# Patient Record
Sex: Male | Born: 1940 | Race: White | Hispanic: No | Marital: Married | Smoking: Former smoker
Health system: Southern US, Community
[De-identification: ages and names within clinical notes are randomized; demographics above are authoritative.]

## PROBLEM LIST (undated history)

## (undated) DIAGNOSIS — R7303 Prediabetes: Secondary | ICD-10-CM

## (undated) DIAGNOSIS — I4891 Unspecified atrial fibrillation: Secondary | ICD-10-CM

## (undated) DIAGNOSIS — F411 Generalized anxiety disorder: Secondary | ICD-10-CM

## (undated) DIAGNOSIS — I10 Essential (primary) hypertension: Secondary | ICD-10-CM

## (undated) HISTORY — DX: Prediabetes: R73.03

## (undated) HISTORY — DX: Essential (primary) hypertension: I10

## (undated) HISTORY — PX: TOOTH EXTRACTION: SUR596

## (undated) HISTORY — DX: Unspecified atrial fibrillation: I48.91

## (undated) HISTORY — DX: Generalized anxiety disorder: F41.1

---

## 2003-09-03 HISTORY — PX: OTHER SURGICAL HISTORY: SHX169

## 2017-04-08 ENCOUNTER — Ambulatory Visit: Payer: Self-pay | Admitting: Family Medicine

## 2019-03-12 ENCOUNTER — Other Ambulatory Visit: Payer: Self-pay

## 2019-03-15 ENCOUNTER — Encounter (INDEPENDENT_AMBULATORY_CARE_PROVIDER_SITE_OTHER): Payer: Self-pay

## 2019-03-15 ENCOUNTER — Inpatient Hospital Stay: Payer: Medicare Other | Attending: Internal Medicine | Admitting: Internal Medicine

## 2019-03-15 ENCOUNTER — Inpatient Hospital Stay: Payer: Medicare Other | Admitting: *Deleted

## 2019-03-15 ENCOUNTER — Encounter: Payer: Self-pay | Admitting: Internal Medicine

## 2019-03-15 ENCOUNTER — Other Ambulatory Visit: Payer: Self-pay

## 2019-03-15 DIAGNOSIS — Z87891 Personal history of nicotine dependence: Secondary | ICD-10-CM | POA: Insufficient documentation

## 2019-03-15 DIAGNOSIS — N183 Chronic kidney disease, stage 3 unspecified: Secondary | ICD-10-CM | POA: Insufficient documentation

## 2019-03-15 DIAGNOSIS — I4891 Unspecified atrial fibrillation: Secondary | ICD-10-CM | POA: Diagnosis not present

## 2019-03-15 DIAGNOSIS — Z7901 Long term (current) use of anticoagulants: Secondary | ICD-10-CM

## 2019-03-15 DIAGNOSIS — N189 Chronic kidney disease, unspecified: Secondary | ICD-10-CM | POA: Diagnosis present

## 2019-03-15 DIAGNOSIS — D649 Anemia, unspecified: Secondary | ICD-10-CM

## 2019-03-15 DIAGNOSIS — Z801 Family history of malignant neoplasm of trachea, bronchus and lung: Secondary | ICD-10-CM | POA: Diagnosis not present

## 2019-03-15 DIAGNOSIS — D631 Anemia in chronic kidney disease: Secondary | ICD-10-CM | POA: Diagnosis not present

## 2019-03-15 LAB — CBC WITH DIFFERENTIAL/PLATELET
Abs Immature Granulocytes: 0.01 10*3/uL (ref 0.00–0.07)
Basophils Absolute: 0.1 10*3/uL (ref 0.0–0.1)
Basophils Relative: 2 %
Eosinophils Absolute: 0.3 10*3/uL (ref 0.0–0.5)
Eosinophils Relative: 5 %
HCT: 27 % — ABNORMAL LOW (ref 39.0–52.0)
Hemoglobin: 8.8 g/dL — ABNORMAL LOW (ref 13.0–17.0)
Immature Granulocytes: 0 %
Lymphocytes Relative: 22 %
Lymphs Abs: 1.2 10*3/uL (ref 0.7–4.0)
MCH: 29.7 pg (ref 26.0–34.0)
MCHC: 32.6 g/dL (ref 30.0–36.0)
MCV: 91.2 fL (ref 80.0–100.0)
Monocytes Absolute: 0.5 10*3/uL (ref 0.1–1.0)
Monocytes Relative: 9 %
Neutro Abs: 3.4 10*3/uL (ref 1.7–7.7)
Neutrophils Relative %: 62 %
Platelets: 125 10*3/uL — ABNORMAL LOW (ref 150–400)
RBC: 2.96 MIL/uL — ABNORMAL LOW (ref 4.22–5.81)
RDW: 15.8 % — ABNORMAL HIGH (ref 11.5–15.5)
WBC: 5.5 10*3/uL (ref 4.0–10.5)
nRBC: 0 % (ref 0.0–0.2)

## 2019-03-15 LAB — LACTATE DEHYDROGENASE: LDH: 143 U/L (ref 98–192)

## 2019-03-15 LAB — COMPREHENSIVE METABOLIC PANEL
ALT: 12 U/L (ref 0–44)
AST: 19 U/L (ref 15–41)
Albumin: 4.4 g/dL (ref 3.5–5.0)
Alkaline Phosphatase: 55 U/L (ref 38–126)
Anion gap: 6 (ref 5–15)
BUN: 26 mg/dL — ABNORMAL HIGH (ref 8–23)
CO2: 23 mmol/L (ref 22–32)
Calcium: 9 mg/dL (ref 8.9–10.3)
Chloride: 107 mmol/L (ref 98–111)
Creatinine, Ser: 1.61 mg/dL — ABNORMAL HIGH (ref 0.61–1.24)
GFR calc Af Amer: 47 mL/min — ABNORMAL LOW (ref >60)
GFR calc non Af Amer: 40 mL/min — ABNORMAL LOW (ref >60)
Glucose, Bld: 100 mg/dL — ABNORMAL HIGH (ref 70–99)
Potassium: 4.8 mmol/L (ref 3.5–5.1)
Sodium: 136 mmol/L (ref 135–145)
Total Bilirubin: 0.9 mg/dL (ref 0.3–1.2)
Total Protein: 8.8 g/dL — ABNORMAL HIGH (ref 6.5–8.1)

## 2019-03-15 LAB — FOLATE: Folate: 13.5 ng/mL (ref >5.9)

## 2019-03-15 LAB — IRON AND TIBC
Iron: 84 ug/dL (ref 45–182)
Saturation Ratios: 24 % (ref 17.9–39.5)
TIBC: 346 ug/dL (ref 250–450)
UIBC: 262 ug/dL

## 2019-03-15 LAB — FERRITIN: Ferritin: 58 ng/mL (ref 24–336)

## 2019-03-15 NOTE — Progress Notes (Signed)
Patient referred by PCP for anemia and thrombocytopenia evaluation.  Recent labs drawn on 03/03/19 resulted in [place]

## 2019-03-15 NOTE — Assessment & Plan Note (Addendum)
#  Normocytic anemia-hemoglobin 9/chronic.  I suspect secondary to CKD/iron deficiency.  However given abnormal M protein-multiple myeloma has to be ruled out/clinically less likely.  See discussion below.   #Recommend checking CBC CMP LDH; kappa lambda light chain ratio.  Iron studies/saturation folic acid.  Discussed options of bone marrow biopsy-however this could potentially wait until above work-up.  Discussed with the patient that if his anemia is from CKD would recommend IV iron infusions/erythropoietin stimulating agents [patient's mother had Procrit].  Discussed the potential acute infusion reactions with IV iron; which are quite rare.  Patient understands the risk; will proceed with infusions. 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  # A.fib- on coumadin/followed by [facility] cards.   # Discussed the patient's girlfriend over the phone at length.  Agreement.  Thank you Dr.Anderson for allowing me to participate in the care of your pleasant patient. Please do not hesitate to contact me with questions or concerns in the interim.  # DISPOSITION:  # labs today # follow up in 2 weeks- MD; no labs; Possible Ferrahem.

## 2019-03-15 NOTE — Progress Notes (Signed)
[place] NOTE  Patient Care Team: Kirk Ruths, MD as PCP - General (Internal Medicine)  CHIEF COMPLAINTS/PURPOSE OF CONSULTATION:    HEMATOLOGY HISTORY  # CHRONIC ANEMIA- [at least 0315]- worsening- July 2020- 9/ nomocytic; NO Coloscopy; EGD- [place], 2017  # ? 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  male has been referred to [place] for further evaluation/work-up for anemia.  Patient through work-up with his PCP noted to have a hemoglobin of 9; which was lower than his baseline around 11.  Also myeloma work-up showed M protein of 1 g/dL  Patient denies any blood in stools or black-colored stools.  Denies any blood in urine.  Denies any abnormal weight loss.  Patient denies any difficulty swallowing.  Patient currently not on p.o. iron because of intolerance/constipation/dyspepsia.  Patient never had a screening colonoscopy.   Review of Systems  Constitutional: Positive for malaise/fatigue. Negative for chills, diaphoresis, fever and weight loss.  HENT: Negative for nosebleeds and sore throat.   Eyes: Negative for double vision.  Respiratory: Negative for cough, hemoptysis, sputum production, shortness of breath and wheezing.   Cardiovascular: Negative for chest pain, palpitations, orthopnea and leg swelling.  Gastrointestinal: Negative for abdominal pain, blood in stool, constipation, diarrhea, heartburn, melena, nausea and vomiting.  Genitourinary: Negative for dysuria, frequency and urgency.  Musculoskeletal: Negative for back pain and joint pain.  Skin: Negative.  Negative for itching and rash.  Neurological: Negative for dizziness, tingling, focal weakness, weakness and headaches.  Endo/Heme/Allergies: Does not bruise/bleed easily.  Psychiatric/Behavioral: Negative for depression. The patient is not nervous/anxious and does not  have insomnia.     MEDICAL HISTORY:  Past Medical History:  Diagnosis Date  . Atrial fibrillation ([place])   . GAD (generalized anxiety disorder)   . Hypertension   . Prediabetes     SURGICAL HISTORY: Past Surgical History:  Procedure Laterality Date  . supraorbital artery repair  2005  . TOOTH EXTRACTION      SOCIAL HISTORY: Social History   Socioeconomic History  . Marital status: Single    Spouse name: Not on file  . Number of children: Not on file  . Years of education: Not on file  . Highest education level: Not on file  Occupational History  . Not on file  Social Needs  . Financial resource strain: Not on file  . Food insecurity    Worry: Not on file    Inability: Not on file  . Transportation needs    Medical: Not on file    Non-medical: Not on file  Tobacco Use  . Smoking status: Former Smoker    Types: Cigarettes  Substance and Sexual Activity  . Alcohol use: Never    Frequency: Never  . Drug use: Never  . Sexual activity: Not on file  Lifestyle  . Physical activity    Days per week: Not on file    Minutes per session: Not on file  . Stress: Not on file  Relationships  . Social Herbalist on phone: Not on file    Gets together: Not on file    Attends religious service: Not on file    Active member of club or organization: Not on file    Attends meetings of clubs or organizations: Not on file    Relationship status: Not on file  . Intimate  partner violence    Fear of current or ex partner: Not on file    Emotionally abused: Not on file    Physically abused: Not on file    Forced sexual activity: Not on file  Other Topics Concern  . 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     FAMILY HISTORY: Family History  Problem Relation Age of Onset  . Lung cancer Father     ALLERGIES:  is allergic to bupropion and amiodarone.  MEDICATIONS:  Current  Outpatient Medications  Medication Sig Dispense Refill  . ALPRAZolam (XANAX) 0.5 MG tablet Take 0.5 mg by mouth 2 (two) times daily.     Marland Kitchen amLODipine (NORVASC) 5 MG tablet Take 5 mg by mouth daily.     . metoprolol tartrate (LOPRESSOR) 25 MG tablet Take 25 mg by mouth daily.     . sertraline (ZOLOFT) 50 MG tablet Take by mouth.    . sotalol (BETAPACE) 80 MG tablet Take 80 mg by mouth daily.     Marland Kitchen warfarin (COUMADIN) 3 MG tablet Take as directed-Take  1 tablet by mouth  on Monday,Wednesday,Friday and 1.5 tablets by mouth all other days     No current facility-administered medications for this visit.       PHYSICAL EXAMINATION:   Vitals:   03/15/19 1121  BP: (!) 157/71  Pulse: 60  Resp: 18  Temp: 97.6 F (36.4 C)   Filed Weights   03/15/19 1121  Weight: 174 lb (78.9 kg)    Physical Exam  Constitutional: He is oriented to person, place, and time and well-developed, well-nourished, and in no distress.  HENT:  Head: Normocephalic and atraumatic.  Mouth/Throat: Oropharynx is clear and moist. No oropharyngeal exudate.  Eyes: Pupils are equal, round, and reactive to light.  Neck: Normal range of motion. Neck supple.  Cardiovascular: Normal rate and regular rhythm.  Pulmonary/Chest: No respiratory distress. He has no wheezes.  Abdominal: Soft. Bowel sounds are normal. He exhibits no distension and no mass. There is no abdominal tenderness. There is no rebound and no guarding.  Musculoskeletal: Normal range of motion.        General: No tenderness or edema.  Neurological: He is alert and oriented to person, place, and time.  Skin: Skin is warm.  Psychiatric: Affect normal.    LABORATORY DATA:  I have reviewed the data as listed Lab Results  Component Value Date   WBC 5.5 03/15/2019   HGB 8.8 (L) 03/15/2019   HCT 27.0 (L) 03/15/2019   MCV 91.2 03/15/2019   PLT 125 (L) 03/15/2019   Recent Labs    03/15/19 1206  NA 136  K 4.8  CL 107  CO2 23  GLUCOSE 100*  BUN 26*   CREATININE 1.61*  CALCIUM 9.0  GFRNONAA 40*  GFRAA 47*  PROT 8.8*  ALBUMIN 4.4  AST 19  ALT 12  ALKPHOS 55  BILITOT 0.9     No results found. Normocytic anemia # Normocytic anemia-hemoglobin 9/chronic.  I suspect secondary to CKD/iron deficiency.  However given abnormal M protein-multiple myeloma has to be ruled out/clinically less likely.  See discussion below.   #Recommend checking CBC CMP LDH; kappa lambda light chain ratio.  Iron studies/saturation folic acid.  Discussed options of bone marrow biopsy-however this could potentially wait until above work-up.  Discussed with the patient that if his anemia is from CKD would recommend IV  iron infusions/erythropoietin stimulating agents [patient's mother had Procrit].  Discussed the potential acute infusion reactions with IV iron; which are quite rare.  Patient understands the risk; will proceed with infusions. 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  # A.fib- on coumadin/followed by [facility] cards.   # Discussed the patient's girlfriend over the phone at length.  Agreement.  Thank you Dr.Anderson for allowing me to participate in the care of your pleasant patient. Please do not hesitate to contact me with questions or concerns in the interim.  # DISPOSITION:  # labs today # follow up in 2 weeks- MD; no labs; Possible Ferrahem.   All questions were answered. The patient knows to call the clinic with any problems, questions or concerns.    Cammie Sickle, MD 03/15/2019 7:08 PM

## 2019-03-16 LAB — KAPPA/LAMBDA LIGHT CHAINS
Kappa free light chain: 76.1 mg/L — ABNORMAL HIGH (ref 3.3–19.4)
Kappa, lambda light chain ratio: 1.94 — ABNORMAL HIGH (ref 0.26–1.65)
Lambda free light chains: 39.2 mg/L — ABNORMAL HIGH (ref 5.7–26.3)

## 2019-03-16 LAB — TECHNOLOGIST SMEAR REVIEW

## 2019-03-26 ENCOUNTER — Other Ambulatory Visit: Payer: Self-pay

## 2019-03-29 ENCOUNTER — Inpatient Hospital Stay (HOSPITAL_BASED_OUTPATIENT_CLINIC_OR_DEPARTMENT_OTHER): Payer: Medicare Other | Admitting: Internal Medicine

## 2019-03-29 ENCOUNTER — Inpatient Hospital Stay: Payer: Medicare Other

## 2019-03-29 ENCOUNTER — Other Ambulatory Visit: Payer: Self-pay

## 2019-03-29 VITALS — BP 128/69 | HR 55 | Resp 18

## 2019-03-29 DIAGNOSIS — D696 Thrombocytopenia, unspecified: Secondary | ICD-10-CM

## 2019-03-29 DIAGNOSIS — D649 Anemia, unspecified: Secondary | ICD-10-CM

## 2019-03-29 DIAGNOSIS — D631 Anemia in chronic kidney disease: Secondary | ICD-10-CM

## 2019-03-29 DIAGNOSIS — N183 Chronic kidney disease, stage 3 unspecified: Secondary | ICD-10-CM

## 2019-03-29 DIAGNOSIS — R5382 Chronic fatigue, unspecified: Secondary | ICD-10-CM

## 2019-03-29 DIAGNOSIS — N189 Chronic kidney disease, unspecified: Secondary | ICD-10-CM

## 2019-03-29 DIAGNOSIS — Z87891 Personal history of nicotine dependence: Secondary | ICD-10-CM

## 2019-03-29 MED ORDER — SODIUM CHLORIDE 0.9 % IV SOLN
Freq: Once | INTRAVENOUS | Status: AC
  Administered 2019-03-29: 12:00:00 via INTRAVENOUS
  Filled 2019-03-29: qty 250

## 2019-03-29 MED ORDER — ACETAMINOPHEN 325 MG PO TABS
650.0000 mg | ORAL_TABLET | Freq: Once | ORAL | Status: AC
  Administered 2019-03-29: 12:00:00 650 mg via ORAL
  Filled 2019-03-29: qty 2

## 2019-03-29 MED ORDER — SODIUM CHLORIDE 0.9 % IV SOLN
510.0000 mg | Freq: Once | INTRAVENOUS | Status: AC
  Administered 2019-03-29: 13:00:00 510 mg via INTRAVENOUS
  Filled 2019-03-29: qty 17

## 2019-03-29 NOTE — Assessment & Plan Note (Addendum)
#  Normocytic anemia-chronic; recently worsening-8.8.  Suspect second to CKD/mild iron deficiency saturation 24%; ferritin 58.  I clinically doubt this is related to his slightly elevated M protein [see below].  #Discussed with the patient the above work-up results.  Discussed with the patient that definitive diagnosis would need a bone marrow biopsy.  However since his anemia is chronic/I think it is reasonable to try iron infusion.  If his hemoglobin does not improve; I discussed with the patient that he will need a bone marrow biopsy.  He is now committed with the plan.  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  Again likely benign causes suspected.  Would recommend ultrasound/imaging at next visit.  Discussed the potential acute infusion reactions with IV iron; which are quite rare.  Patient understands the risk; will proceed with infusions. 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  Plan as above.  # DISPOSITION:  # IV Ferrahem today; # IV ferrahem in 1 week;  # follow up in 8 weeks- MD; cbc/bmp Possible Ferrahem-Dr.B

## 2019-03-29 NOTE — Progress Notes (Signed)
[place] NOTE  Patient Care Team: Kirk Ruths, MD as PCP - General (Internal Medicine)  CHIEF COMPLAINTS/PURPOSE OF CONSULTATION:    HEMATOLOGY HISTORY  # CHRONIC ANEMIA- [at least 9935]- worsening- July 2020- 9/ nomocytic; NO Coloscopy; EGD- [place], 2017  #Chronic mild thrombocytopenia [120s]-question etiology  # ? 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  male history of chronic kidney disease-stage III; is here today with results of his work-up for his worsening anemia.  Patient continues to have chronic fatigue.  Denies any blood in stools or black or stools.  Is not on p.o. iron because of poor tolerance.   Review of Systems  Constitutional: Positive for malaise/fatigue. Negative for chills, diaphoresis, fever and weight loss.  HENT: Negative for nosebleeds and sore throat.   Eyes: Negative for double vision.  Respiratory: Negative for cough, hemoptysis, sputum production, shortness of breath and wheezing.   Cardiovascular: Negative for chest pain, palpitations, orthopnea and leg swelling.  Gastrointestinal: Negative for abdominal pain, blood in stool, constipation, diarrhea, heartburn, melena, nausea and vomiting.  Genitourinary: Negative for dysuria, frequency and urgency.  Musculoskeletal: Negative for back pain and joint pain.  Skin: Negative.  Negative for itching and rash.  Neurological: Negative for dizziness, tingling, focal weakness, weakness and headaches.  Endo/Heme/Allergies: Does not bruise/bleed easily.  Psychiatric/Behavioral: Negative for depression. The patient is not nervous/anxious and does not have insomnia.     MEDICAL HISTORY:  Past Medical History:  Diagnosis Date  . Atrial fibrillation ([place])   . GAD (generalized anxiety disorder)   . Hypertension   . Prediabetes     SURGICAL HISTORY: Past  Surgical History:  Procedure Laterality Date  . supraorbital artery repair  2005  . TOOTH EXTRACTION      SOCIAL HISTORY: Social History   Socioeconomic History  . Marital status: Single    Spouse name: Not on file  . Number of children: Not on file  . Years of education: Not on file  . Highest education level: Not on file  Occupational History  . Not on file  Social Needs  . Financial resource strain: Not on file  . Food insecurity    Worry: Not on file    Inability: Not on file  . Transportation needs    Medical: Not on file    Non-medical: Not on file  Tobacco Use  . Smoking status: Former Smoker    Types: Cigarettes  Substance and Sexual Activity  . Alcohol use: Never    Frequency: Never  . Drug use: Never  . Sexual activity: Not on file  Lifestyle  . Physical activity    Days per week: Not on file    Minutes per session: Not on file  . Stress: Not on file  Relationships  . Social Herbalist on phone: Not on file    Gets together: Not on file    Attends religious service: Not on file    Active member of club or organization: Not on file    Attends meetings of clubs or organizations: Not on file    Relationship status: Not on file  . Intimate partner violence    Fear of current or ex partner: Not on file    Emotionally abused: Not on file    Physically abused: Not on file    Forced sexual activity:  Not on file  Other Topics Concern  . 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     FAMILY HISTORY: Family History  Problem Relation Age of Onset  . Lung cancer Father     ALLERGIES:  is allergic to bupropion and amiodarone.  MEDICATIONS:  Current Outpatient Medications  Medication Sig Dispense Refill  . ALPRAZolam (XANAX) 0.5 MG tablet Take 0.5 mg by mouth 2 (two) times daily.     Marland Kitchen amLODipine (NORVASC) 5 MG tablet Take 5 mg by mouth daily.     . metoprolol  tartrate (LOPRESSOR) 25 MG tablet Take 25 mg by mouth daily.     . sertraline (ZOLOFT) 50 MG tablet Take by mouth.    . sotalol (BETAPACE) 80 MG tablet Take 80 mg by mouth daily.     Marland Kitchen warfarin (COUMADIN) 3 MG tablet Take as directed-Take  1 tablet by mouth  on Monday,Wednesday,Friday and 1.5 tablets by mouth all other days     No current facility-administered medications for this visit.    Facility-Administered Medications Ordered in Other Visits  Medication Dose Route Frequency Provider Last Rate Last Dose  . ferumoxytol (FERAHEME) 510 mg in sodium chloride 0.9 % 100 mL IVPB  510 mg Intravenous Once Charlaine Dalton R, MD          PHYSICAL EXAMINATION:   Vitals:   03/29/19 1103  BP: 122/69  Pulse: (!) 57  Resp: 18  Temp: 98 F (36.7 C)   Filed Weights   03/29/19 1103  Weight: 175 lb 12.8 oz (79.7 kg)    Physical Exam  Constitutional: He is oriented to person, place, and time and well-developed, well-nourished, and in no distress.  HENT:  Head: Normocephalic and atraumatic.  Mouth/Throat: Oropharynx is clear and moist. No oropharyngeal exudate.  Eyes: Pupils are equal, round, and reactive to light.  Neck: Normal range of motion. Neck supple.  Cardiovascular: Normal rate and regular rhythm.  Pulmonary/Chest: No respiratory distress. He has no wheezes.  Abdominal: Soft. Bowel sounds are normal. He exhibits no distension and no mass. There is no abdominal tenderness. There is no rebound and no guarding.  Musculoskeletal: Normal range of motion.        General: No tenderness or edema.  Neurological: He is alert and oriented to person, place, and time.  Skin: Skin is warm.  Psychiatric: Affect normal.    LABORATORY DATA:  I have reviewed the data as listed Lab Results  Component Value Date   WBC 5.5 03/15/2019   HGB 8.8 (L) 03/15/2019   HCT 27.0 (L) 03/15/2019   MCV 91.2 03/15/2019   PLT 125 (L) 03/15/2019   Recent Labs    03/15/19 1206  NA 136  K 4.8  CL  107  CO2 23  GLUCOSE 100*  BUN 26*  CREATININE 1.61*  CALCIUM 9.0  GFRNONAA 40*  GFRAA 47*  PROT 8.8*  ALBUMIN 4.4  AST 19  ALT 12  ALKPHOS 55  BILITOT 0.9     No results found. Normocytic anemia # Normocytic anemia-chronic; recently worsening-8.8.  Suspect second to CKD/mild iron deficiency saturation 24%; ferritin 58.  I clinically doubt this is related to his slightly elevated M protein [see below].  #Discussed with the patient the above work-up results.  Discussed with the patient that definitive diagnosis would need a bone marrow biopsy.  However since his anemia is chronic/I think it is  reasonable to try iron infusion.  If his hemoglobin does not improve; I discussed with the patient that he will need a bone marrow biopsy.  He is now committed with the plan.  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  Again likely benign causes suspected.  Would recommend ultrasound/imaging at next visit.  Discussed the potential acute infusion reactions with IV iron; which are quite rare.  Patient understands the risk; will proceed with infusions. 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  Plan as above.  # DISPOSITION:  # IV Ferrahem today; # IV ferrahem in 1 week;  # follow up in 8 weeks- MD; cbc/bmp Possible Ferrahem-Dr.B  All questions were answered. The patient knows to call the clinic with any problems, questions or concerns.    Cammie Sickle, MD 03/29/2019 12:37 PM

## 2019-03-29 NOTE — Progress Notes (Signed)
Patient does not offer any problems today.  

## 2019-03-29 NOTE — Progress Notes (Signed)
Pt tolerated first time infusion of Feraheme today well with no signs of reaction. Pt received tylenol 650mg  PO prior to infusion.  VSS prior infusion and upon discharge. All questions answered at this time.   Samuel Mcknight [employer]

## 2019-04-05 ENCOUNTER — Inpatient Hospital Stay: Payer: Medicare Other | Attending: Internal Medicine

## 2019-04-05 ENCOUNTER — Other Ambulatory Visit: Payer: Self-pay

## 2019-04-05 VITALS — BP 122/48 | HR 45 | Temp 96.0°F | Resp 18

## 2019-04-05 DIAGNOSIS — N189 Chronic kidney disease, unspecified: Secondary | ICD-10-CM | POA: Diagnosis not present

## 2019-04-05 DIAGNOSIS — D631 Anemia in chronic kidney disease: Secondary | ICD-10-CM | POA: Diagnosis present

## 2019-04-05 MED ORDER — SODIUM CHLORIDE 0.9 % IV SOLN
510.0000 mg | Freq: Once | INTRAVENOUS | Status: AC
  Administered 2019-04-05: 510 mg via INTRAVENOUS
  Filled 2019-04-05: qty 17

## 2019-04-05 MED ORDER — ACETAMINOPHEN 325 MG PO TABS
650.0000 mg | ORAL_TABLET | Freq: Once | ORAL | Status: AC
  Administered 2019-04-05: 650 mg via ORAL
  Filled 2019-04-05: qty 2

## 2019-04-05 MED ORDER — SODIUM CHLORIDE 0.9 % IV SOLN
Freq: Once | INTRAVENOUS | Status: AC
  Administered 2019-04-05: 14:00:00 via INTRAVENOUS
  Filled 2019-04-05: qty 250

## 2019-04-05 NOTE — Progress Notes (Signed)
Pt tolerated infusion well. Pt and VS stable at discharge.  

## 2019-05-24 ENCOUNTER — Encounter: Payer: Self-pay | Admitting: Internal Medicine

## 2019-05-24 ENCOUNTER — Other Ambulatory Visit: Payer: Self-pay

## 2019-05-24 ENCOUNTER — Inpatient Hospital Stay: Payer: Medicare Other | Attending: Internal Medicine

## 2019-05-24 ENCOUNTER — Inpatient Hospital Stay: Payer: Medicare Other

## 2019-05-24 ENCOUNTER — Inpatient Hospital Stay (HOSPITAL_BASED_OUTPATIENT_CLINIC_OR_DEPARTMENT_OTHER): Payer: Medicare Other | Admitting: Internal Medicine

## 2019-05-24 ENCOUNTER — Other Ambulatory Visit: Payer: Self-pay | Admitting: *Deleted

## 2019-05-24 DIAGNOSIS — N183 Chronic kidney disease, stage 3 unspecified: Secondary | ICD-10-CM

## 2019-05-24 DIAGNOSIS — D649 Anemia, unspecified: Secondary | ICD-10-CM | POA: Diagnosis not present

## 2019-05-24 DIAGNOSIS — D631 Anemia in chronic kidney disease: Secondary | ICD-10-CM

## 2019-05-24 DIAGNOSIS — R5383 Other fatigue: Secondary | ICD-10-CM | POA: Diagnosis not present

## 2019-05-24 DIAGNOSIS — Z87891 Personal history of nicotine dependence: Secondary | ICD-10-CM | POA: Diagnosis not present

## 2019-05-24 DIAGNOSIS — R0609 Other forms of dyspnea: Secondary | ICD-10-CM | POA: Diagnosis not present

## 2019-05-24 DIAGNOSIS — D696 Thrombocytopenia, unspecified: Secondary | ICD-10-CM | POA: Insufficient documentation

## 2019-05-24 LAB — BASIC METABOLIC PANEL
Anion gap: 6 (ref 5–15)
BUN: 30 mg/dL — ABNORMAL HIGH (ref 8–23)
CO2: 24 mmol/L (ref 22–32)
Calcium: 9 mg/dL (ref 8.9–10.3)
Chloride: 109 mmol/L (ref 98–111)
Creatinine, Ser: 1.52 mg/dL — ABNORMAL HIGH (ref 0.61–1.24)
GFR calc Af Amer: 50 mL/min — ABNORMAL LOW (ref >60)
GFR calc non Af Amer: 43 mL/min — ABNORMAL LOW (ref >60)
Glucose, Bld: 103 mg/dL — ABNORMAL HIGH (ref 70–99)
Potassium: 4.7 mmol/L (ref 3.5–5.1)
Sodium: 139 mmol/L (ref 135–145)

## 2019-05-24 LAB — CBC WITH DIFFERENTIAL/PLATELET
Abs Immature Granulocytes: 0.01 10*3/uL (ref 0.00–0.07)
Basophils Absolute: 0.1 10*3/uL (ref 0.0–0.1)
Basophils Relative: 1 %
Eosinophils Absolute: 0.2 10*3/uL (ref 0.0–0.5)
Eosinophils Relative: 4 %
HCT: 24.7 % — ABNORMAL LOW (ref 39.0–52.0)
Hemoglobin: 8 g/dL — ABNORMAL LOW (ref 13.0–17.0)
Immature Granulocytes: 0 %
Lymphocytes Relative: 18 %
Lymphs Abs: 1 10*3/uL (ref 0.7–4.0)
MCH: 31.5 pg (ref 26.0–34.0)
MCHC: 32.4 g/dL (ref 30.0–36.0)
MCV: 97.2 fL (ref 80.0–100.0)
Monocytes Absolute: 0.7 10*3/uL (ref 0.1–1.0)
Monocytes Relative: 12 %
Neutro Abs: 3.6 10*3/uL (ref 1.7–7.7)
Neutrophils Relative %: 65 %
Platelets: 109 10*3/uL — ABNORMAL LOW (ref 150–400)
RBC: 2.54 MIL/uL — ABNORMAL LOW (ref 4.22–5.81)
RDW: 16.5 % — ABNORMAL HIGH (ref 11.5–15.5)
WBC: 5.6 10*3/uL (ref 4.0–10.5)
nRBC: 0 % (ref 0.0–0.2)

## 2019-05-24 NOTE — Assessment & Plan Note (Addendum)
#  Normocytic anemia-chronic-question secondary to CKD/mild iron deficiency[ saturation 24%; ferritin 58].  Status post 2 IV Feraheme infusions hemoglobin did not improve; in fact got worse today hemoglobin is 8.  Patient is more symptomatic with worsening shortness of breath especially exertion.  #Given the lack of improvement status post IV iron infusion/worsening anemia; also the context of MGUS-I would recommend a bone marrow biopsy for further evaluation-.  Also check copper zinc levels.  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  See plan for bone marrow biopsy above.  # DISPOSITION:  # NO IV Ferrahem today; # Bone marrow Biopsy asap Dx; anemia # follow up in 2 weeks- MD; labs- cbc/hold tube-  Dr.B

## 2019-05-24 NOTE — Progress Notes (Signed)
[facility] NOTE  Patient Care Team: Kirk Ruths, MD as PCP - General (Internal Medicine)  CHIEF COMPLAINTS/PURPOSE OF CONSULTATION:    HEMATOLOGY HISTORY  # CHRONIC ANEMIA- [at least 2035]- worsening- July 2020- 9/ nomocytic; NO Coloscopy; EGD- [place], 2017  #Chronic mild thrombocytopenia [120s]-question etiology  # ? 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  male history of chronic kidney disease-stage III/with worsening anemia is here for follow-up.  Patient had IV Feraheme x2 infusions since July 2020.  Patient has not felt significantly improved over the last 2 months.  He notes to have worsening fatigue.  Complains of worsening shortness of breath on exertion.  No nausea vomiting.  Appetite is fair.  He continues to deny any blood in stools or black or stools or blood in urine.  Review of Systems  Constitutional: Positive for malaise/fatigue. Negative for chills, diaphoresis, fever and weight loss.  HENT: Negative for nosebleeds and sore throat.   Eyes: Negative for double vision.  Respiratory: Positive for shortness of breath. Negative for cough, hemoptysis, sputum production and wheezing.   Cardiovascular: Negative for chest pain, palpitations, orthopnea and leg swelling.  Gastrointestinal: Negative for abdominal pain, blood in stool, constipation, diarrhea, heartburn, melena, nausea and vomiting.  Genitourinary: Negative for dysuria, frequency and urgency.  Musculoskeletal: Negative for back pain and joint pain.  Skin: Negative.  Negative for itching and rash.  Neurological: Negative for dizziness, tingling, focal weakness, weakness and headaches.  Endo/Heme/Allergies: Does not bruise/bleed easily.  Psychiatric/Behavioral: Negative for depression. The patient is not nervous/anxious and does not have insomnia.     MEDICAL  HISTORY:  Past Medical History:  Diagnosis Date  . Atrial fibrillation ([place])   . GAD (generalized anxiety disorder)   . Hypertension   . Prediabetes     SURGICAL HISTORY: Past Surgical History:  Procedure Laterality Date  . supraorbital artery repair  2005  . TOOTH EXTRACTION      SOCIAL HISTORY: Social History   Socioeconomic History  . Marital status: Single    Spouse name: Not on file  . Number of children: Not on file  . Years of education: Not on file  . Highest education level: Not on file  Occupational History  . Not on file  Social Needs  . Financial resource strain: Not on file  . Food insecurity    Worry: Not on file    Inability: Not on file  . Transportation needs    Medical: Not on file    Non-medical: Not on file  Tobacco Use  . Smoking status: Former Smoker    Types: Cigarettes  Substance and Sexual Activity  . Alcohol use: Never    Frequency: Never  . Drug use: Never  . Sexual activity: Not on file  Lifestyle  . Physical activity    Days per week: Not on file    Minutes per session: Not on file  . Stress: Not on file  Relationships  . Social Herbalist on phone: Not on file    Gets together: Not on file    Attends religious service: Not on file    Active member of club or organization: Not on file    Attends meetings of clubs or organizations: Not on file    Relationship status: Not on file  . Intimate partner violence  Fear of current or ex partner: Not on file    Emotionally abused: Not on file    Physically abused: Not on file    Forced sexual activity: Not on file  Other Topics Concern  . 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     FAMILY HISTORY: Family History  Problem Relation Age of Onset  . Lung cancer Father     ALLERGIES:  is allergic to bupropion and amiodarone.  MEDICATIONS:  Current Outpatient Medications  Medication  Sig Dispense Refill  . ALPRAZolam (XANAX) 0.5 MG tablet Take 0.5 mg by mouth 2 (two) times daily.     Marland Kitchen amLODipine (NORVASC) 5 MG tablet Take 5 mg by mouth daily.     . metoprolol tartrate (LOPRESSOR) 25 MG tablet Take 25 mg by mouth daily.     . sertraline (ZOLOFT) 50 MG tablet Take by mouth.    . sotalol (BETAPACE) 80 MG tablet Take 80 mg by mouth daily.     Marland Kitchen warfarin (COUMADIN) 3 MG tablet Take as directed-Take  1 tablet by mouth  on Monday,Wednesday,Friday and 1.5 tablets by mouth all other days     No current facility-administered medications for this visit.       PHYSICAL EXAMINATION:   Vitals:   05/24/19 1331  BP: (!) 159/62  Pulse: (!) 53  Resp: 20  Temp: (!) 97.2 F (36.2 C)   Filed Weights   05/24/19 1331  Weight: 179 lb (81.2 kg)    Physical Exam  Constitutional: He is oriented to person, place, and time and well-developed, well-nourished, and in no distress.  HENT:  Head: Normocephalic and atraumatic.  Mouth/Throat: Oropharynx is clear and moist. No oropharyngeal exudate.  Eyes: Pupils are equal, round, and reactive to light.  Neck: Normal range of motion. Neck supple.  Cardiovascular: Normal rate and regular rhythm.  Pulmonary/Chest: No respiratory distress. He has no wheezes.  Abdominal: Soft. Bowel sounds are normal. He exhibits no distension and no mass. There is no abdominal tenderness. There is no rebound and no guarding.  Musculoskeletal: Normal range of motion.        General: No tenderness or edema.  Neurological: He is alert and oriented to person, place, and time.  Skin: Skin is warm.  Psychiatric: Affect normal.    LABORATORY DATA:  I have reviewed the data as listed Lab Results  Component Value Date   WBC 5.6 05/24/2019   HGB 8.0 (L) 05/24/2019   HCT 24.7 (L) 05/24/2019   MCV 97.2 05/24/2019   PLT 109 (L) 05/24/2019   Recent Labs    03/15/19 1206 05/24/19 1322  NA 136 139  K 4.8 4.7  CL 107 109  CO2 23 24  GLUCOSE 100* 103*   BUN 26* 30*  CREATININE 1.61* 1.52*  CALCIUM 9.0 9.0  GFRNONAA 40* 43*  GFRAA 47* 50*  PROT 8.8*  --   ALBUMIN 4.4  --   AST 19  --   ALT 12  --   ALKPHOS 55  --   BILITOT 0.9  --      No results found. Normocytic anemia # Normocytic anemia-chronic-question secondary to CKD/mild iron deficiency[ saturation 24%; ferritin 58].  Status post 2 IV Feraheme infusions hemoglobin did not improve; in fact got worse today hemoglobin is 8.  Patient is more symptomatic with worsening shortness of breath especially exertion.  #Given the lack of  improvement status post IV iron infusion/worsening anemia; also the context of MGUS-I would recommend a bone marrow biopsy for further evaluation-.  Also check copper zinc levels.  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  See plan for bone marrow biopsy above.  # DISPOSITION:  # NO IV Ferrahem today; # Bone marrow Biopsy asap Dx; anemia # follow up in 2 weeks- MD; labs- cbc/hold tube-  Dr.B  All questions were answered. The patient knows to call the clinic with any problems, questions or concerns.    Cammie Sickle, MD 05/24/2019 2:32 PM

## 2019-05-31 ENCOUNTER — Other Ambulatory Visit: Payer: Self-pay | Admitting: Radiology

## 2019-06-01 ENCOUNTER — Other Ambulatory Visit: Payer: Self-pay

## 2019-06-01 ENCOUNTER — Ambulatory Visit
Admission: RE | Admit: 2019-06-01 | Discharge: 2019-06-01 | Disposition: A | Discharge status: Home / Self Care | Payer: Medicare Other | Source: Ambulatory Visit | Attending: Internal Medicine | Admitting: Internal Medicine

## 2019-06-01 DIAGNOSIS — F411 Generalized anxiety disorder: Secondary | ICD-10-CM | POA: Insufficient documentation

## 2019-06-01 DIAGNOSIS — Z801 Family history of malignant neoplasm of trachea, bronchus and lung: Secondary | ICD-10-CM | POA: Diagnosis not present

## 2019-06-01 DIAGNOSIS — Z79899 Other long term (current) drug therapy: Secondary | ICD-10-CM | POA: Insufficient documentation

## 2019-06-01 DIAGNOSIS — D649 Anemia, unspecified: Secondary | ICD-10-CM | POA: Diagnosis present

## 2019-06-01 DIAGNOSIS — Z7901 Long term (current) use of anticoagulants: Secondary | ICD-10-CM | POA: Diagnosis not present

## 2019-06-01 DIAGNOSIS — I4891 Unspecified atrial fibrillation: Secondary | ICD-10-CM | POA: Diagnosis not present

## 2019-06-01 DIAGNOSIS — R7303 Prediabetes: Secondary | ICD-10-CM | POA: Insufficient documentation

## 2019-06-01 DIAGNOSIS — Z87891 Personal history of nicotine dependence: Secondary | ICD-10-CM | POA: Insufficient documentation

## 2019-06-01 DIAGNOSIS — I10 Essential (primary) hypertension: Secondary | ICD-10-CM | POA: Diagnosis not present

## 2019-06-01 LAB — CBC WITH DIFFERENTIAL/PLATELET
Abs Immature Granulocytes: 0.01 10*3/uL (ref 0.00–0.07)
Basophils Absolute: 0.1 10*3/uL (ref 0.0–0.1)
Basophils Relative: 2 %
Eosinophils Absolute: 0.3 10*3/uL (ref 0.0–0.5)
Eosinophils Relative: 5 %
HCT: 27.6 % — ABNORMAL LOW (ref 39.0–52.0)
Hemoglobin: 8.7 g/dL — ABNORMAL LOW (ref 13.0–17.0)
Immature Granulocytes: 0 %
Lymphocytes Relative: 27 %
Lymphs Abs: 1.3 10*3/uL (ref 0.7–4.0)
MCH: 30.6 pg (ref 26.0–34.0)
MCHC: 31.5 g/dL (ref 30.0–36.0)
MCV: 97.2 fL (ref 80.0–100.0)
Monocytes Absolute: 0.6 10*3/uL (ref 0.1–1.0)
Monocytes Relative: 13 %
Neutro Abs: 2.4 10*3/uL (ref 1.7–7.7)
Neutrophils Relative %: 53 %
Platelets: 128 10*3/uL — ABNORMAL LOW (ref 150–400)
RBC: 2.84 MIL/uL — ABNORMAL LOW (ref 4.22–5.81)
RDW: 15.7 % — ABNORMAL HIGH (ref 11.5–15.5)
WBC: 4.6 10*3/uL (ref 4.0–10.5)
nRBC: 0 % (ref 0.0–0.2)

## 2019-06-01 LAB — PROTIME-INR
INR: 2.1 — ABNORMAL HIGH (ref 0.8–1.2)
Prothrombin Time: 23.5 seconds — ABNORMAL HIGH (ref 11.4–15.2)

## 2019-06-01 MED ORDER — MIDAZOLAM HCL 2 MG/2ML IJ SOLN
INTRAMUSCULAR | Status: DC | PRN
  Administered 2019-06-01: 2 mg via INTRAVENOUS

## 2019-06-01 MED ORDER — SODIUM CHLORIDE 0.9 % IV SOLN
INTRAVENOUS | Status: DC
  Administered 2019-06-01: 09:00:00 via INTRAVENOUS

## 2019-06-01 MED ORDER — MIDAZOLAM HCL 2 MG/2ML IJ SOLN
INTRAMUSCULAR | Status: AC
  Filled 2019-06-01: qty 2

## 2019-06-01 MED ORDER — HEPARIN SOD (PORK) LOCK FLUSH 100 UNIT/ML IV SOLN
INTRAVENOUS | Status: AC
  Filled 2019-06-01: qty 5

## 2019-06-01 MED ORDER — FENTANYL CITRATE (PF) 100 MCG/2ML IJ SOLN
INTRAMUSCULAR | Status: DC | PRN
  Administered 2019-06-01: 25 ug via INTRAVENOUS

## 2019-06-01 MED ORDER — FENTANYL CITRATE (PF) 100 MCG/2ML IJ SOLN
INTRAMUSCULAR | Status: AC
  Filled 2019-06-01: qty 2

## 2019-06-01 NOTE — Progress Notes (Signed)
Pt. Stable after Bx.VSS.Back stable.D/C instructions given. F/U with his MD.

## 2019-06-01 NOTE — Discharge Instructions (Signed)
Bone Marrow Aspiration and Bone Marrow Biopsy, Adult, Care After This sheet gives you information about how to care for yourself after your procedure. If you have problems or questions, contact your health care provider.  What can I expect after the procedure?  After the procedure, it is common to have:  Mild pain and tenderness.  Swelling.  Bruising.  Follow these instructions at home:  Take over-the-counter or prescription medicines only as told by your health care provider.  You may shower tomorrow ? Remove band aid tomorrow, replace with another bandaid if  site has any drainage from biopsy site. ? Wash your hands with soap and water before you touch your biopsy site  If soap and water are not available, use hand sanitizer. ? Change your dressing frequently for bleeding and/or drainage.  Check your puncture site every day for signs of infection. Check for: ? More redness, swelling, or pain. ? More fluid or blood. ? Warmth. ? Pus or a bad smell.  Return to your normal activities in 24hours.   Do not drive for 24 hours if you were given a medicine to help you relax (sedative).  Keep all follow-up visits as told by your health care provider. This is important. Contact a health care provider if:  You have more redness, swelling, or pain around the puncture site.  You have more fluid or blood coming from the puncture site.  Your puncture site feels warm to the touch.  You have pus or a bad smell coming from the puncture site.  You have a fever.  Your pain is not controlled with medicine. This information is not intended to replace advice given to you by your health care provider. Make sure you discuss any questions you have with your health care provider. Document Released: 03/08/2005 Document Revised: 03/08/2016 Document Reviewed: 01/31/2016 Elsevier Interactive Patient Education  2018 [employer].

## 2019-06-01 NOTE — H&P (Signed)
Chief Complaint: Patient was seen in consultation today for No chief complaint on file.  at the request of Brahmanday,Govinda R  Referring Physician(s): Cammie Sickle   Supervising Physician: Register   History of Present Illness: Samuel Mcknight is a 78 y.o. male  In for bone marrow bx.Increased risk of bleeding discussed with pt.  Past Medical History:  Diagnosis Date  . Atrial fibrillation ([place])   . GAD (generalized anxiety disorder)   . Hypertension   . Prediabetes     Past Surgical History:  Procedure Laterality Date  . supraorbital artery repair  2005  . TOOTH EXTRACTION      Allergies: Bupropion and Amiodarone  Medications: Prior to Admission medications   Medication Sig Start Date End Date Taking? Authorizing Provider  ALPRAZolam Duanne Moron) 0.5 MG tablet Take 0.5 mg by mouth 2 (two) times daily.  02/11/19  Yes [provider]  amLODipine (NORVASC) 5 MG tablet Take 5 mg by mouth daily.  01/21/19  Yes [provider]  metoprolol tartrate (LOPRESSOR) 25 MG tablet Take 25 mg by mouth daily.  05/01/18  Yes [provider]  sertraline (ZOLOFT) 50 MG tablet Take by mouth. 08/11/18 08/11/19 Yes [provider]  sotalol (BETAPACE) 80 MG tablet Take 80 mg by mouth daily.  02/11/19  Yes [provider]  warfarin (COUMADIN) 3 MG tablet Take as directed-Take  1 tablet by mouth  on Monday,Wednesday,Friday and 1.5 tablets by mouth all other days 08/06/18  Yes [provider]     Family History  Problem Relation Age of Onset  . Lung cancer Father     Social History   Socioeconomic History  . Marital status: Single    Spouse name: Not on file  . Number of children: Not on file  . Years of education: Not on file  . Highest education level: Not on file  Occupational History  . Not on file  Social Needs  . Financial resource strain: Not on file  . Food insecurity    Worry: Not on file    Inability: Not on file  .  Transportation needs    Medical: Not on file    Non-medical: Not on file  Tobacco Use  . Smoking status: Former Smoker    Types: Cigarettes  Substance and Sexual Activity  . Alcohol use: Never    Frequency: Never  . Drug use: Never  . Sexual activity: Not on file  Lifestyle  . Physical activity    Days per week: Not on file    Minutes per session: Not on file  . Stress: Not on file  Relationships  . Social Herbalist on phone: Not on file    Gets together: Not on file    Attends religious service: Not on file    Active member of club or organization: Not on file    Attends meetings of clubs or organizations: Not on file    Relationship status: Not on file  Other Topics Concern  . 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      Review of Systems: A 12 point ROS discussed and pertinent positives are indicated in the HPI above.  All other systems are negative.  Review of Systems  Vital Signs: BP (!) 150/47   Pulse 72   Temp 98.1 F (36.7 C) (Oral)   Resp 20   Ht 5'  7" (1.702 m)   Wt 81.2 kg   BMI 28.04 kg/m   Physical Exam VSS.HEENT neg.Chest clear.  Imaging: No results found.  Labs:  CBC: Recent Labs    03/15/19 1206 05/24/19 1322 06/01/19 0812  WBC 5.5 5.6 4.6  HGB 8.8* 8.0* 8.7*  HCT 27.0* 24.7* 27.6*  PLT 125* 109* 128*    COAGS: Recent Labs    06/01/19 0812  INR 2.1*    BMP: Recent Labs    03/15/19 1206 05/24/19 1322  NA 136 139  K 4.8 4.7  CL 107 109  CO2 23 24  GLUCOSE 100* 103*  BUN 26* 30*  CALCIUM 9.0 9.0  CREATININE 1.61* 1.52*  GFRNONAA 40* 43*  GFRAA 47* 50*    LIVER FUNCTION TESTS: Recent Labs    03/15/19 1206  BILITOT 0.9  AST 19  ALT 12  ALKPHOS 55  PROT 8.8*  ALBUMIN 4.4    TUMOR MARKERS: No results for input(s): AFPTM, CEA, CA199, CHROMGRNA in the last 8760 hours.  Assessment and Plan:  Bone Marrow Bx.   Thank you for this interesting consult.  I greatly enjoyed meeting Samuel Mcknight and look forward to participating in their care.  A copy of this report was sent to the requesting provider on this date.  Electronically Signed: Register, Melanie Crazier, MD 06/01/2019, 8:57 AM   I spent a total of 20 min   in face to face in clinical consultation, greater than 50% of which was counseling/coordinating care for this pt.

## 2019-06-02 ENCOUNTER — Other Ambulatory Visit: Payer: Self-pay | Admitting: Internal Medicine

## 2019-06-03 LAB — SURGICAL PATHOLOGY

## 2019-06-04 NOTE — Progress Notes (Signed)
Called patient no answer left message  

## 2019-06-07 ENCOUNTER — Inpatient Hospital Stay: Payer: Medicare Other | Attending: Internal Medicine | Admitting: Internal Medicine

## 2019-06-07 ENCOUNTER — Other Ambulatory Visit: Payer: Self-pay

## 2019-06-07 ENCOUNTER — Inpatient Hospital Stay: Payer: Medicare Other

## 2019-06-07 VITALS — BP 124/38 | HR 50 | Temp 97.9°F | Wt 176.2 lb

## 2019-06-07 DIAGNOSIS — D649 Anemia, unspecified: Secondary | ICD-10-CM | POA: Diagnosis not present

## 2019-06-07 DIAGNOSIS — K769 Liver disease, unspecified: Secondary | ICD-10-CM

## 2019-06-07 DIAGNOSIS — N183 Chronic kidney disease, stage 3 unspecified: Secondary | ICD-10-CM | POA: Diagnosis present

## 2019-06-07 DIAGNOSIS — D631 Anemia in chronic kidney disease: Secondary | ICD-10-CM | POA: Diagnosis present

## 2019-06-07 LAB — CBC WITH DIFFERENTIAL/PLATELET
Abs Immature Granulocytes: 0.01 10*3/uL (ref 0.00–0.07)
Basophils Absolute: 0.1 10*3/uL (ref 0.0–0.1)
Basophils Relative: 1 %
Eosinophils Absolute: 0.2 10*3/uL (ref 0.0–0.5)
Eosinophils Relative: 3 %
HCT: 26.3 % — ABNORMAL LOW (ref 39.0–52.0)
Hemoglobin: 8.4 g/dL — ABNORMAL LOW (ref 13.0–17.0)
Immature Granulocytes: 0 %
Lymphocytes Relative: 22 %
Lymphs Abs: 1.1 10*3/uL (ref 0.7–4.0)
MCH: 30.5 pg (ref 26.0–34.0)
MCHC: 31.9 g/dL (ref 30.0–36.0)
MCV: 95.6 fL (ref 80.0–100.0)
Monocytes Absolute: 0.7 10*3/uL (ref 0.1–1.0)
Monocytes Relative: 15 %
Neutro Abs: 2.7 10*3/uL (ref 1.7–7.7)
Neutrophils Relative %: 59 %
Platelets: 111 10*3/uL — ABNORMAL LOW (ref 150–400)
RBC: 2.75 MIL/uL — ABNORMAL LOW (ref 4.22–5.81)
RDW: 15.2 % (ref 11.5–15.5)
WBC: 4.7 10*3/uL (ref 4.0–10.5)
nRBC: 0 % (ref 0.0–0.2)

## 2019-06-07 LAB — SAMPLE TO BLOOD BANK

## 2019-06-07 NOTE — Assessment & Plan Note (Deleted)
#  Normocytic anemia-chronic-question secondary to CKD/mild iron deficiency[ saturation 24%; ferritin 58].  Status post 2 IV Feraheme infusions hemoglobin did not improve; in fact got worse today hemoglobin is 8.  Patient is more symptomatic with worsening shortness of breath especially exertion.  #Given the lack of improvement status post IV iron infusion/worsening anemia; also the context of MGUS-I would recommend a bone marrow biopsy for further evaluation-.  Also check copper zinc levels.  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  # DISPOSITION:  # Retracit SQ start next week.  # in 2 week-s H&H Retacrit # follow up in 3 weeks- MD;labs- cbc/bmp;US abdomen prior-  Retacrit; Dr.B 

## 2019-06-07 NOTE — Progress Notes (Signed)
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  male history of chronic kidney disease-stage III/with worsening anemia is is here for follow-up/review the results of the bone marrow.  In the interim patient had a bone marrow biopsy; procedure was uneventful.  Patient continues to complain of significant fatigue.  Complains of shortness of breath on exertion.  No nausea vomiting.   Review of Systems  Constitutional: Positive for malaise/fatigue. Negative for chills, diaphoresis, fever and weight loss.  HENT: Negative for nosebleeds and sore throat.   Eyes: Negative for double vision.  Respiratory: Positive for shortness of breath. Negative for cough, hemoptysis, sputum production and wheezing.   Cardiovascular: Negative for chest pain, palpitations, orthopnea and leg swelling.  Gastrointestinal: Negative for abdominal pain, blood in stool, constipation, diarrhea, heartburn, melena, nausea and vomiting.  Genitourinary: Negative for dysuria, frequency and urgency.  Musculoskeletal: Negative for back pain and joint pain.  Skin: Negative.  Negative for itching and rash.  Neurological: Negative for dizziness, tingling, focal weakness, weakness and headaches.  Endo/Heme/Allergies: Does not bruise/bleed easily.  Psychiatric/Behavioral: Negative for depression. The patient is not nervous/anxious and does not have insomnia.      MEDICAL HISTORY:  Past Medical History:  Diagnosis Date  . Atrial fibrillation ([place])   . GAD (generalized anxiety disorder)   . Hypertension   . Prediabetes     SURGICAL HISTORY: Past Surgical History:  Procedure Laterality Date  . supraorbital artery repair  2005  . TOOTH EXTRACTION      SOCIAL HISTORY: Social History   Socioeconomic History  . Marital status: Single    Spouse name: Not on file  . Number of children: Not on file  . Years of education: Not on file  . Highest education level: Not on file  Occupational History  . Not on file  Social Needs  . Financial resource strain: Not on file  . Food insecurity    Worry: Not on file    Inability: Not on file  . Transportation needs    Medical: Not on file    Non-medical: Not on file  Tobacco Use  . Smoking status: Former Smoker    Types: Cigarettes  Substance and Sexual Activity  . Alcohol use: Never    Frequency: Never  . Drug use: Never  . Sexual activity: Not on file  Lifestyle  . Physical activity    Days per week: Not on file    Minutes per session: Not on file  . Stress: Not on file  Relationships  . Social Herbalist on phone: Not on file    Gets together: Not on file    Attends religious service: Not on file    Active member of club or organization: Not on file    Attends meetings of clubs or organizations: Not on file    Relationship status: Not on file  . Intimate partner violence    Fear of current or ex partner: Not on file  Emotionally abused: Not on file    Physically abused: Not on file    Forced sexual activity: Not on file  Other Topics Concern  . 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     FAMILY HISTORY: Family History  Problem Relation Age of Onset  . Lung cancer Father     ALLERGIES:  is allergic to bupropion and amiodarone.  MEDICATIONS:  Current Outpatient Medications   Medication Sig Dispense Refill  . ALPRAZolam (XANAX) 0.5 MG tablet Take 0.5 mg by mouth 2 (two) times daily.     Marland Kitchen amLODipine (NORVASC) 5 MG tablet Take 5 mg by mouth daily.     . furosemide (LASIX) 20 MG tablet Take 20 mg by mouth daily.    . metoprolol tartrate (LOPRESSOR) 25 MG tablet Take 25 mg by mouth daily.     . sertraline (ZOLOFT) 50 MG tablet Take by mouth.    . sotalol (BETAPACE) 80 MG tablet Take 80 mg by mouth daily.     Marland Kitchen warfarin (COUMADIN) 3 MG tablet Take as directed-Take  1 tablet by mouth  on Monday,Wednesday,Friday and 1.5 tablets by mouth all other days     No current facility-administered medications for this visit.       PHYSICAL EXAMINATION:   Vitals:   06/07/19 1350 06/07/19 1412  BP: (!) 127/40 (!) 124/38  Pulse: (!) 50 (!) 50  Temp: 97.9 F (36.6 C)    Filed Weights   06/07/19 1350  Weight: 176 lb 4 oz (79.9 kg)    Physical Exam  Constitutional: He is oriented to person, place, and time and well-developed, well-nourished, and in no distress.  HENT:  Head: Normocephalic and atraumatic.  Mouth/Throat: Oropharynx is clear and moist. No oropharyngeal exudate.  Eyes: Pupils are equal, round, and reactive to light.  Neck: Normal range of motion. Neck supple.  Cardiovascular: Normal rate and regular rhythm.  Pulmonary/Chest: No respiratory distress. He has no wheezes.  Abdominal: Soft. Bowel sounds are normal. He exhibits no distension and no mass. There is no abdominal tenderness. There is no rebound and no guarding.  Musculoskeletal: Normal range of motion.        General: No tenderness or edema.  Neurological: He is alert and oriented to person, place, and time.  Skin: Skin is warm.  Psychiatric: Affect normal.    LABORATORY DATA:  I have reviewed the data as listed Lab Results  Component Value Date   WBC 4.7 06/07/2019   HGB 8.4 (L) 06/07/2019   HCT 26.3 (L) 06/07/2019   MCV 95.6 06/07/2019   PLT 111 (L) 06/07/2019   Recent Labs     03/15/19 1206 05/24/19 1322  NA 136 139  K 4.8 4.7  CL 107 109  CO2 23 24  GLUCOSE 100* 103*  BUN 26* 30*  CREATININE 1.61* 1.52*  CALCIUM 9.0 9.0  GFRNONAA 40* 43*  GFRAA 47* 50*  PROT 8.8*  --   ALBUMIN 4.4  --   AST 19  --   ALT 12  --   ALKPHOS 55  --   BILITOT 0.9  --      Ct Bone Marrow Biopsy & Aspiration  Result Date: 06/01/2019 INDICATION: Anemia. EXAM: CT-directed bone marrow aspiration and biopsy MEDICATIONS: None. ANESTHESIA/SEDATION: Moderate (conscious) sedation was employed during this procedure. A total of Versed 2 0.0 mg and Fentanyl 25 0.0 mcg was administered intravenously.  Moderate Sedation Time: Approximately 15 minutes. The patient's level of consciousness and vital signs were monitored continuously by radiology nursing throughout the procedure under my direct supervision. COMPLICATIONS: None. PROCEDURE: Informed written consent was obtained from the patient after a thorough discussion of the procedural risks, benefits and alternatives. All questions were addressed. Maximal Sterile Barrier Technique was utilized including caps, mask, sterile gowns, sterile gloves, sterile drape, hand hygiene and skin antiseptic. A timeout was performed prior to the initiation of the procedure. Following local anesthesia 1% lidocaine and under CT guidance standardized bone marrow aspiration and biopsy was performed without complication. To aspiration samples obtained. Two core biopsy samples obtained. IMPRESSION: Successful CT-directed bone marrow aspiration and biopsy. 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  Hemoglobin no significant improvement status post Feraheme.  8.3 today.  Patient is symptomatic.  Bone marrow biopsy negative for any obvious myelodysplastic process or acute leukemic process.  Karyotype normal.  Subtle  dyspoietic changes noted.  Discussed with him the pathology.  Recommend starting Retacrit.  Discussed use of erythropoietin stimulating agents like Aranesp to stimulate the bone marrow.  Discussed the potential issues with erythropoietin estimating agents-given the risk of stroke thromboembolic events/elevated blood pressure.  However, most of the serious events did not happen when the goal hematocrit is 33/hemoglobin 30.   # Chronic mild thrombocytopenia platelets-above 100 monitor for now.  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  # DISPOSITION:  # Retracit SQ start next week.  # in 2 week-s H&H Retacrit # follow up in 3 weeks- MD;labs- cbc/bmp;US abdomen prior-  Retacrit; Dr.B   All questions were answered. The patient knows to call the clinic with any problems, questions or concerns.    Cammie Sickle, MD 06/11/2019 3:32 PM

## 2019-06-08 ENCOUNTER — Encounter (HOSPITAL_COMMUNITY): Payer: Self-pay | Admitting: Internal Medicine

## 2019-06-10 ENCOUNTER — Encounter (HOSPITAL_COMMUNITY): Payer: Self-pay | Admitting: Internal Medicine

## 2019-06-10 ENCOUNTER — Encounter: Payer: Self-pay | Admitting: Internal Medicine

## 2019-06-11 ENCOUNTER — Other Ambulatory Visit: Payer: Self-pay | Admitting: Internal Medicine

## 2019-06-11 NOTE — Assessment & Plan Note (Addendum)
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  Hemoglobin no significant improvement status post Feraheme.  8.3 today.  Patient is symptomatic.  Bone marrow biopsy negative for any obvious myelodysplastic process or acute leukemic process.  Karyotype normal.  Subtle dyspoietic changes noted.  Discussed with him the pathology.  Recommend starting Retacrit.  Discussed use of erythropoietin stimulating agents like Aranesp to stimulate the bone marrow.  Discussed the potential issues with erythropoietin estimating agents-given the risk of stroke thromboembolic events/elevated blood pressure.  However, most of the serious events did not happen when the goal hematocrit is 33/hemoglobin 30.   # Chronic mild thrombocytopenia platelets-above 100 monitor for now.  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  # DISPOSITION:  # Retracit SQ start next week.  # in 2 week-s H&H Retacrit # follow up in 3 weeks- MD;labs- cbc/bmp;US abdomen prior-  Retacrit; Dr.B

## 2019-06-14 ENCOUNTER — Inpatient Hospital Stay: Payer: Medicare Other

## 2019-06-14 ENCOUNTER — Other Ambulatory Visit: Payer: Self-pay

## 2019-06-14 VITALS — BP 134/71 | HR 58

## 2019-06-14 DIAGNOSIS — N183 Chronic kidney disease, stage 3 unspecified: Secondary | ICD-10-CM

## 2019-06-14 DIAGNOSIS — D631 Anemia in chronic kidney disease: Secondary | ICD-10-CM

## 2019-06-14 MED ORDER — EPOETIN ALFA-EPBX 10000 UNIT/ML IJ SOLN
20000.0000 [IU] | Freq: Once | INTRAMUSCULAR | Status: AC
  Administered 2019-06-14: 14:00:00 20000 [IU] via SUBCUTANEOUS

## 2019-06-14 NOTE — Progress Notes (Signed)
Use last week lab results

## 2019-06-21 ENCOUNTER — Inpatient Hospital Stay: Payer: Medicare Other

## 2019-06-21 ENCOUNTER — Ambulatory Visit
Admission: RE | Admit: 2019-06-21 | Discharge: 2019-06-21 | Disposition: A | Discharge status: Home / Self Care | Payer: Medicare Other | Source: Ambulatory Visit | Attending: Internal Medicine | Admitting: Internal Medicine

## 2019-06-21 ENCOUNTER — Other Ambulatory Visit: Payer: Self-pay

## 2019-06-21 VITALS — BP 135/66 | HR 48

## 2019-06-21 DIAGNOSIS — D649 Anemia, unspecified: Secondary | ICD-10-CM

## 2019-06-21 DIAGNOSIS — D631 Anemia in chronic kidney disease: Secondary | ICD-10-CM

## 2019-06-21 DIAGNOSIS — K769 Liver disease, unspecified: Secondary | ICD-10-CM | POA: Diagnosis present

## 2019-06-21 DIAGNOSIS — N183 Chronic kidney disease, stage 3 unspecified: Secondary | ICD-10-CM | POA: Diagnosis not present

## 2019-06-21 LAB — HEMOGLOBIN: Hemoglobin: 8.9 g/dL — ABNORMAL LOW (ref 13.0–17.0)

## 2019-06-21 LAB — HEMATOCRIT: HCT: 27.2 % — ABNORMAL LOW (ref 39.0–52.0)

## 2019-06-21 MED ORDER — EPOETIN ALFA-EPBX 10000 UNIT/ML IJ SOLN
20000.0000 [IU] | Freq: Once | INTRAMUSCULAR | Status: AC
  Administered 2019-06-21: 14:00:00 20000 [IU] via SUBCUTANEOUS
  Filled 2019-06-21: qty 2

## 2019-06-28 ENCOUNTER — Inpatient Hospital Stay: Payer: Medicare Other

## 2019-06-28 ENCOUNTER — Other Ambulatory Visit: Payer: Self-pay

## 2019-06-28 ENCOUNTER — Inpatient Hospital Stay (HOSPITAL_BASED_OUTPATIENT_CLINIC_OR_DEPARTMENT_OTHER): Payer: Medicare Other | Admitting: Oncology

## 2019-06-28 DIAGNOSIS — N183 Chronic kidney disease, stage 3 unspecified: Secondary | ICD-10-CM | POA: Diagnosis not present

## 2019-06-28 DIAGNOSIS — D631 Anemia in chronic kidney disease: Secondary | ICD-10-CM

## 2019-06-28 DIAGNOSIS — K769 Liver disease, unspecified: Secondary | ICD-10-CM

## 2019-06-28 DIAGNOSIS — D649 Anemia, unspecified: Secondary | ICD-10-CM

## 2019-06-28 LAB — CBC WITH DIFFERENTIAL/PLATELET
Abs Immature Granulocytes: 0.01 10*3/uL (ref 0.00–0.07)
Basophils Absolute: 0.1 10*3/uL (ref 0.0–0.1)
Basophils Relative: 2 %
Eosinophils Absolute: 0.2 10*3/uL (ref 0.0–0.5)
Eosinophils Relative: 3 %
HCT: 30.2 % — ABNORMAL LOW (ref 39.0–52.0)
Hemoglobin: 9.4 g/dL — ABNORMAL LOW (ref 13.0–17.0)
Immature Granulocytes: 0 %
Lymphocytes Relative: 22 %
Lymphs Abs: 1 10*3/uL (ref 0.7–4.0)
MCH: 30.3 pg (ref 26.0–34.0)
MCHC: 31.1 g/dL (ref 30.0–36.0)
MCV: 97.4 fL (ref 80.0–100.0)
Monocytes Absolute: 0.5 10*3/uL (ref 0.1–1.0)
Monocytes Relative: 10 %
Neutro Abs: 3 10*3/uL (ref 1.7–7.7)
Neutrophils Relative %: 63 %
Platelets: 118 10*3/uL — ABNORMAL LOW (ref 150–400)
RBC: 3.1 MIL/uL — ABNORMAL LOW (ref 4.22–5.81)
RDW: 15.1 % (ref 11.5–15.5)
WBC: 4.8 10*3/uL (ref 4.0–10.5)
nRBC: 0 % (ref 0.0–0.2)

## 2019-06-28 LAB — BASIC METABOLIC PANEL
Anion gap: 9 (ref 5–15)
BUN: 28 mg/dL — ABNORMAL HIGH (ref 8–23)
CO2: 24 mmol/L (ref 22–32)
Calcium: 9.4 mg/dL (ref 8.9–10.3)
Chloride: 106 mmol/L (ref 98–111)
Creatinine, Ser: 1.82 mg/dL — ABNORMAL HIGH (ref 0.61–1.24)
GFR calc Af Amer: 40 mL/min — ABNORMAL LOW (ref >60)
GFR calc non Af Amer: 35 mL/min — ABNORMAL LOW (ref >60)
Glucose, Bld: 135 mg/dL — ABNORMAL HIGH (ref 70–99)
Potassium: 5.2 mmol/L — ABNORMAL HIGH (ref 3.5–5.1)
Sodium: 139 mmol/L (ref 135–145)

## 2019-06-28 MED ORDER — EPOETIN ALFA-EPBX 10000 UNIT/ML IJ SOLN
20000.0000 [IU] | Freq: Once | INTRAMUSCULAR | Status: AC
  Administered 2019-06-28: 20000 [IU] via SUBCUTANEOUS
  Filled 2019-06-28: qty 2

## 2019-06-28 NOTE — Progress Notes (Signed)
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  male history of chronic kidney disease-stage III/with worsening anemia is is here for follow-up/review of recent ultrasound.  He has received a total of 2 doses of 20,000 units Retacrit with improvement of his hemoglobin.  States his fatigue has improved since beginning Retacrit.  Has intermittent shortness of breath with exertion but also notes improvement.  No nausea or vomiting.   Review of Systems  Constitutional: Positive for malaise/fatigue.  Respiratory: Positive for shortness of breath.   Neurological: Positive for weakness.    MEDICAL HISTORY:  Past Medical History:  Diagnosis Date  . Atrial fibrillation ([place])   . GAD (generalized anxiety disorder)   . Hypertension   . Prediabetes     SURGICAL HISTORY: Past Surgical History:  Procedure Laterality Date  . supraorbital artery repair  2005  . TOOTH EXTRACTION      SOCIAL HISTORY: Social History   Socioeconomic History  . Marital status: Single    Spouse name: Not on file  . Number of children: Not on file  . Years of education: Not on file  . Highest education level: Not on file  Occupational History  . Not on file  Social Needs  . Financial resource strain: Not on file  . Food insecurity    Worry: Not on file    Inability: Not on file  . Transportation needs    Medical: Not on file     Non-medical: Not on file  Tobacco Use  . Smoking status: Former Smoker    Types: Cigarettes  Substance and Sexual Activity  . Alcohol use: Never    Frequency: Never  . Drug use: Never  . Sexual activity: Not on file  Lifestyle  . Physical activity    Days per week: Not on file    Minutes per session: Not on file  . Stress: Not on file  Relationships  . Social Herbalist on phone: Not on file    Gets together: Not on file    Attends religious service: Not on file    Active member of club or organization: Not on file    Attends meetings of clubs or organizations: Not on file    Relationship status: Not on file  . Intimate partner violence    Fear of current or ex partner: Not on file    Emotionally abused: Not on file    Physically abused: Not on file    Forced sexual activity: Not on file  Other Topics Concern  . 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     FAMILY HISTORY: Family History  Problem Relation Age of Onset  . Lung cancer Father     ALLERGIES:  is allergic to bupropion and amiodarone.  MEDICATIONS:  Current Outpatient Medications  Medication Sig Dispense Refill  . ALPRAZolam (XANAX) 0.5 MG tablet Take 0.5 mg  by mouth 2 (two) times daily.     Marland Kitchen amLODipine (NORVASC) 5 MG tablet Take 5 mg by mouth daily.     . furosemide (LASIX) 20 MG tablet Take 20 mg by mouth daily.    . metoprolol tartrate (LOPRESSOR) 25 MG tablet Take 25 mg by mouth daily.     . sertraline (ZOLOFT) 50 MG tablet Take by mouth.    . sotalol (BETAPACE) 80 MG tablet Take 80 mg by mouth daily.     Marland Kitchen warfarin (COUMADIN) 3 MG tablet Take as directed-Take  1 tablet by mouth  on Monday,Wednesday,Friday and 1.5 tablets by mouth all other days     No current facility-administered medications for this visit.       PHYSICAL EXAMINATION:   Vitals:   06/28/19 1419  BP: 124/64  Pulse: (!) 54  Resp: 16   Temp: (!) 97.3 F (36.3 C)   Filed Weights   06/28/19 1419  Weight: 173 lb (78.5 kg)    Physical Exam  Constitutional: He is oriented to person, place, and time and well-developed, well-nourished, and in no distress. Vital signs are normal.  HENT:  Head: Normocephalic and atraumatic.  Eyes: Pupils are equal, round, and reactive to light.  Neck: Normal range of motion.  Cardiovascular: Normal rate, regular rhythm and normal heart sounds.  No murmur heard. Pulmonary/Chest: Effort normal and breath sounds normal. He has no wheezes.  Abdominal: Soft. Normal appearance and bowel sounds are normal. He exhibits no distension. There is no abdominal tenderness.  Musculoskeletal: Normal range of motion.        General: No edema.  Neurological: He is alert and oriented to person, place, and time. Gait normal.  Skin: Skin is warm and dry. No rash noted.  Psychiatric: Mood, memory, affect and judgment normal.    LABORATORY DATA:  I have reviewed the data as listed Lab Results  Component Value Date   WBC 4.8 06/28/2019   HGB 9.4 (L) 06/28/2019   HCT 30.2 (L) 06/28/2019   MCV 97.4 06/28/2019   PLT 118 (L) 06/28/2019   Recent Labs    03/15/19 1206 05/24/19 1322 06/28/19 1351  NA 136 139 139  K 4.8 4.7 5.2*  CL 107 109 106  CO2 _0 GLUCOSE 100* 103* 135*  BUN 26* 30* 28*  CREATININE 1.61* 1.52* 1.82*  CALCIUM 9.0 9.0 9.4  GFRNONAA 40* 43* 35*  GFRAA 47* 50* 40*  PROT 8.8*  --   --   ALBUMIN 4.4  --   --   AST 19  --   --   ALT 12  --   --   ALKPHOS 55  --   --   BILITOT 0.9  --   --      US Abdomen Complete  Result Date: 06/21/2019 CLINICAL DATA:  Normocytic anemia EXAM: ABDOMEN ULTRASOUND COMPLETE COMPARISON:  None. FINDINGS: Gallbladder: Gallbladder is well distended. Multiple small calculi are identified. No wall thickening or pericholecystic fluid is seen. Common bile duct: Diameter: 4.3 mm. Liver: Mild nodularity to the contour of the liver is noted. No focal  mass is seen. Portal vein is patent on color Doppler imaging with normal direction of blood flow towards the liver. IVC: No abnormality visualized. Pancreas: Visualized portion unremarkable. Spleen: Size and appearance within normal limits. Right Kidney: Length: 9.8 cm. No mass lesion or hydronephrosis is noted. Mild cortical thinning is seen. Left Kidney: Length: 10.1 cm. No mass lesion or hydronephrosis is noted.  Mild cortical thinning is seen. Abdominal aorta: Atherosclerotic changes without evidence of aneurysmal dilatation. Other findings: None. 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 Small gallstones without complicating factors. Mild cortical thinning in the kidneys bilaterally. Electronically Signed   By: Inez Catalina M.D.   On: 06/21/2019 11:01   Ct Bone Marrow Biopsy & Aspiration  Result Date: 06/01/2019 INDICATION: Anemia. EXAM: CT-directed bone marrow aspiration and biopsy MEDICATIONS: None. ANESTHESIA/SEDATION: Moderate (conscious) sedation was employed during this procedure. A total of Versed 2 0.0 mg and Fentanyl 25 0.0 mcg was administered intravenously. Moderate Sedation Time: Approximately 15 minutes. The patient's level of consciousness and vital signs were monitored continuously by radiology nursing throughout the procedure under my direct supervision. COMPLICATIONS: None. PROCEDURE: Informed written consent was obtained from the patient after a thorough discussion of the procedural risks, benefits and alternatives. All questions were addressed. Maximal Sterile Barrier Technique was utilized including caps, mask, sterile gowns, sterile gloves, sterile drape, hand hygiene and skin antiseptic. A timeout was performed prior to the initiation of the procedure. Following local anesthesia 1% lidocaine and under CT guidance standardized bone marrow aspiration and biopsy was performed without complication. To aspiration samples obtained. Two core biopsy  samples obtained. IMPRESSION: Successful CT-directed bone marrow aspiration and biopsy. 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  Hemoglobin no significant improvement status post Feraheme.  Patient is symptomatic.  Bone marrow biopsy negative for any obvious myelodysplastic process or acute leukemic process.  Karyotype normal.  Subtle dyspoietic changes noted.  Discussed with him the pathology.  Recommend starting Retacrit.  He has received a total of 2 doses weekly 20,000 units.  Hemoglobin has improved and today is 9.2.  Goal is between 10 and 11.  Proceed with 20,000 units Retacrit.  Chronic mild thrombocytopenia: Had ultrasound right upper quadrant which revealed a normal spleen/mild early cirrhosis.  Platelet count 118,000 today.  Continue to monitor.  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  # DISPOSITION:  # RTC in 1 week for lab and possible Retacrit. #Return to clinic in 2 weeks for lab, MD assessment and possible Retacrit.   All questions were answered. The patient knows to call the clinic with any problems, questions or concerns.    Jacquelin Hawking, NP 06/28/2019 3:07 PM

## 2019-06-28 NOTE — Assessment & Plan Note (Addendum)
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  Hemoglobin no significant improvement status post Feraheme.  Patient is symptomatic.  Bone marrow biopsy negative for any obvious myelodysplastic process or acute leukemic process.  Karyotype normal.  Subtle dyspoietic changes noted.  Discussed with him the pathology.  Recommend starting Retacrit.  He has received a total of 2 doses weekly 20,000 units.  Hemoglobin has improved and today is 9.2.  Goal is between 10 and 11.  Proceed with 20,000 units Retacrit.  Chronic mild thrombocytopenia: Had ultrasound right upper quadrant which revealed a normal spleen/mild early cirrhosis.  Platelet count 118,000 today.  Continue to monitor.  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  # DISPOSITION:  # RTC in 1 week for lab and possible Retacrit. #Return to clinic in 2 weeks for lab, MD assessment and possible Retacrit.

## 2019-07-02 ENCOUNTER — Other Ambulatory Visit: Payer: Self-pay

## 2019-07-05 ENCOUNTER — Inpatient Hospital Stay: Payer: Medicare Other

## 2019-07-05 ENCOUNTER — Inpatient Hospital Stay: Payer: Medicare Other | Attending: Internal Medicine

## 2019-07-05 ENCOUNTER — Other Ambulatory Visit: Payer: Self-pay

## 2019-07-05 VITALS — BP 135/60 | HR 52 | Resp 18

## 2019-07-05 DIAGNOSIS — D631 Anemia in chronic kidney disease: Secondary | ICD-10-CM

## 2019-07-05 DIAGNOSIS — D649 Anemia, unspecified: Secondary | ICD-10-CM

## 2019-07-05 DIAGNOSIS — N183 Chronic kidney disease, stage 3 unspecified: Secondary | ICD-10-CM | POA: Diagnosis not present

## 2019-07-05 DIAGNOSIS — K769 Liver disease, unspecified: Secondary | ICD-10-CM

## 2019-07-05 LAB — CBC WITH DIFFERENTIAL/PLATELET
Abs Immature Granulocytes: 0.01 10*3/uL (ref 0.00–0.07)
Basophils Absolute: 0.1 10*3/uL (ref 0.0–0.1)
Basophils Relative: 1 %
Eosinophils Absolute: 0.2 10*3/uL (ref 0.0–0.5)
Eosinophils Relative: 3 %
HCT: 31.6 % — ABNORMAL LOW (ref 39.0–52.0)
Hemoglobin: 10 g/dL — ABNORMAL LOW (ref 13.0–17.0)
Immature Granulocytes: 0 %
Lymphocytes Relative: 23 %
Lymphs Abs: 1.1 10*3/uL (ref 0.7–4.0)
MCH: 30.4 pg (ref 26.0–34.0)
MCHC: 31.6 g/dL (ref 30.0–36.0)
MCV: 96 fL (ref 80.0–100.0)
Monocytes Absolute: 0.6 10*3/uL (ref 0.1–1.0)
Monocytes Relative: 14 %
Neutro Abs: 2.8 10*3/uL (ref 1.7–7.7)
Neutrophils Relative %: 59 %
Platelets: 101 10*3/uL — ABNORMAL LOW (ref 150–400)
RBC: 3.29 MIL/uL — ABNORMAL LOW (ref 4.22–5.81)
RDW: 15.1 % (ref 11.5–15.5)
WBC: 4.7 10*3/uL (ref 4.0–10.5)
nRBC: 0 % (ref 0.0–0.2)

## 2019-07-05 MED ORDER — EPOETIN ALFA-EPBX 10000 UNIT/ML IJ SOLN
20000.0000 [IU] | Freq: Once | INTRAMUSCULAR | Status: AC
  Administered 2019-07-05: 20000 [IU] via SUBCUTANEOUS
  Filled 2019-07-05: qty 2

## 2019-07-09 ENCOUNTER — Other Ambulatory Visit: Payer: Self-pay

## 2019-07-09 ENCOUNTER — Other Ambulatory Visit: Payer: Self-pay | Admitting: *Deleted

## 2019-07-09 DIAGNOSIS — D631 Anemia in chronic kidney disease: Secondary | ICD-10-CM

## 2019-07-12 ENCOUNTER — Other Ambulatory Visit: Payer: Self-pay

## 2019-07-12 ENCOUNTER — Inpatient Hospital Stay (HOSPITAL_BASED_OUTPATIENT_CLINIC_OR_DEPARTMENT_OTHER): Payer: Medicare Other | Admitting: Internal Medicine

## 2019-07-12 ENCOUNTER — Inpatient Hospital Stay: Payer: Medicare Other | Attending: Internal Medicine

## 2019-07-12 ENCOUNTER — Inpatient Hospital Stay: Payer: Medicare Other

## 2019-07-12 VITALS — BP 118/51 | HR 51 | Temp 96.8°F | Resp 20

## 2019-07-12 DIAGNOSIS — D631 Anemia in chronic kidney disease: Secondary | ICD-10-CM

## 2019-07-12 DIAGNOSIS — N183 Chronic kidney disease, stage 3 unspecified: Secondary | ICD-10-CM | POA: Diagnosis present

## 2019-07-12 LAB — CBC WITH DIFFERENTIAL/PLATELET
Abs Immature Granulocytes: 0.01 10*3/uL (ref 0.00–0.07)
Basophils Absolute: 0.1 10*3/uL (ref 0.0–0.1)
Basophils Relative: 2 %
Eosinophils Absolute: 0.2 10*3/uL (ref 0.0–0.5)
Eosinophils Relative: 4 %
HCT: 30.9 % — ABNORMAL LOW (ref 39.0–52.0)
Hemoglobin: 9.8 g/dL — ABNORMAL LOW (ref 13.0–17.0)
Immature Granulocytes: 0 %
Lymphocytes Relative: 23 %
Lymphs Abs: 1 10*3/uL (ref 0.7–4.0)
MCH: 30.2 pg (ref 26.0–34.0)
MCHC: 31.7 g/dL (ref 30.0–36.0)
MCV: 95.1 fL (ref 80.0–100.0)
Monocytes Absolute: 0.5 10*3/uL (ref 0.1–1.0)
Monocytes Relative: 12 %
Neutro Abs: 2.5 10*3/uL (ref 1.7–7.7)
Neutrophils Relative %: 59 %
Platelets: 99 10*3/uL — ABNORMAL LOW (ref 150–400)
RBC: 3.25 MIL/uL — ABNORMAL LOW (ref 4.22–5.81)
RDW: 15 % (ref 11.5–15.5)
WBC: 4.2 10*3/uL (ref 4.0–10.5)
nRBC: 0 % (ref 0.0–0.2)

## 2019-07-12 LAB — BASIC METABOLIC PANEL
Anion gap: 10 (ref 5–15)
BUN: 29 mg/dL — ABNORMAL HIGH (ref 8–23)
CO2: 23 mmol/L (ref 22–32)
Calcium: 9.2 mg/dL (ref 8.9–10.3)
Chloride: 105 mmol/L (ref 98–111)
Creatinine, Ser: 1.79 mg/dL — ABNORMAL HIGH (ref 0.61–1.24)
GFR calc Af Amer: 41 mL/min — ABNORMAL LOW (ref >60)
GFR calc non Af Amer: 36 mL/min — ABNORMAL LOW (ref >60)
Glucose, Bld: 103 mg/dL — ABNORMAL HIGH (ref 70–99)
Potassium: 4.7 mmol/L (ref 3.5–5.1)
Sodium: 138 mmol/L (ref 135–145)

## 2019-07-12 LAB — SAMPLE TO BLOOD BANK

## 2019-07-12 MED ORDER — EPOETIN ALFA-EPBX 10000 UNIT/ML IJ SOLN
20000.0000 [IU] | Freq: Once | INTRAMUSCULAR | Status: AC
  Administered 2019-07-12: 20000 [IU] via SUBCUTANEOUS
  Filled 2019-07-12: qty 2

## 2019-07-12 NOTE — Assessment & Plan Note (Addendum)
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  Continue retacrit/IV Iron; proceed with retacrit today.   # Chronic mild thrombocytopenia platelets-~? Cirrhosis of liver; but spleen- WNL; 99/ stable.   # MGUS-IgG kappa 1 g/dL; kappa lambda light chain ratio 1.9;-monitor for now.   # DISPOSITION:  # Retracit SQ today. # in 2 week- H&H Retacrit # in 4 weeks- H&H- Retacrit # follow up in 6 weeks- MD;labs- cbc/bmp;Retacrit; Dr.B

## 2019-07-12 NOTE — Progress Notes (Signed)
[place] NOTE  Patient Care Team: Kirk Ruths, MD as PCP - General (Internal Medicine)  CHIEF COMPLAINTS/PURPOSE OF CONSULTATION:    HEMATOLOGY HISTORY  # CHRONIC ANEMIA- [at least 0272]- worsening- July 2020- 9/ nomocytic; NO Coloscopy; EGD- [place], 2017; October 2020-bone marrow biopsy-no overt myelodysplasia noted; subtle dyspoietic changes noted/karyotype normal; MDS FISH[[facility]]- NEG panel; HOLD off NGS.   #Chronic mild thrombocytopenia [120s]-question etiology; ? 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  male history of anemia secondary to iron deficiency/chronic kidney disease-stage III is here for follow-up.  Patient is currently on Retacrit.  Patient noted to have significant improvement of his fatigue since being on the Retacrit.  His energy levels are improving.  Continues to have shortness of breath on exertion.  No swelling in the legs.    Review of Systems  Constitutional: Positive for malaise/fatigue. Negative for chills, diaphoresis, fever and weight loss.  HENT: Negative for nosebleeds and sore throat.   Eyes: Negative for double vision.  Respiratory: Positive for shortness of breath. Negative for cough, hemoptysis, sputum production and wheezing.   Cardiovascular: Negative for chest pain, palpitations, orthopnea and leg swelling.  Gastrointestinal: Negative for abdominal pain, blood in stool, constipation, diarrhea, heartburn, melena, nausea and vomiting.  Genitourinary: Negative for dysuria, frequency and urgency.  Musculoskeletal: Negative for back pain and joint pain.  Skin: Negative.  Negative for itching and rash.  Neurological: Negative for dizziness, tingling, focal weakness, weakness and headaches.  Endo/Heme/Allergies: Does not bruise/bleed easily.   Psychiatric/Behavioral: Negative for depression. The patient is not nervous/anxious and does not have insomnia.     MEDICAL HISTORY:  Past Medical History:  Diagnosis Date  . Atrial fibrillation ([place])   . GAD (generalized anxiety disorder)   . Hypertension   . Prediabetes     SURGICAL HISTORY: Past Surgical History:  Procedure Laterality Date  . supraorbital artery repair  2005  . TOOTH EXTRACTION      SOCIAL HISTORY: Social History   Socioeconomic History  . Marital status: Single    Spouse name: Not on file  . Number of children: Not on file  . Years of education: Not on file  . Highest education level: Not on file  Occupational History  . Not on file  Social Needs  . Financial resource strain: Not on file  . Food insecurity    Worry: Not on file    Inability: Not on file  . Transportation needs    Medical: Not on file    Non-medical: Not on file  Tobacco Use  . Smoking status: Former Smoker    Types: Cigarettes  Substance and Sexual Activity  . Alcohol use: Never    Frequency: Never  . Drug use: Never  . Sexual activity: Not on file  Lifestyle  . Physical activity    Days per week: Not on file    Minutes per session: Not on file  . Stress: Not on file  Relationships  . Social Herbalist on phone: Not on file    Gets together: Not on file    Attends religious service: Not on file    Active member of club or organization: Not on file    Attends meetings of clubs or organizations: Not on file    Relationship status: Not on file  .  Intimate partner violence    Fear of current or ex partner: Not on file    Emotionally abused: Not on file    Physically abused: Not on file    Forced sexual activity: Not on file  Other Topics Concern  . 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     FAMILY HISTORY: Family History  Problem Relation Age of Onset  . Lung cancer  Father     ALLERGIES:  is allergic to bupropion and amiodarone.  MEDICATIONS:  Current Outpatient Medications  Medication Sig Dispense Refill  . ALPRAZolam (XANAX) 0.5 MG tablet Take 0.5 mg by mouth 2 (two) times daily.     Marland Kitchen amLODipine (NORVASC) 5 MG tablet Take 5 mg by mouth daily.     . furosemide (LASIX) 20 MG tablet Take 20 mg by mouth daily.    . metoprolol tartrate (LOPRESSOR) 25 MG tablet Take 25 mg by mouth daily.     . sotalol (BETAPACE) 80 MG tablet Take 80 mg by mouth daily.     Marland Kitchen warfarin (COUMADIN) 3 MG tablet Take as directed-Take  1 tablet by mouth  on Monday,Wednesday,Friday and 1.5 tablets by mouth all other days    . sertraline (ZOLOFT) 50 MG tablet Take by mouth.     No current facility-administered medications for this visit.       PHYSICAL EXAMINATION:   Vitals:   07/09/19 1626  BP: (!) 118/51  Pulse: (!) 51  Temp: (!) 96.8 F (36 C)   Filed Weights   07/09/19 1626  Weight: 175 lb 8 oz (79.6 kg)    Physical Exam  Constitutional: He is oriented to person, place, and time and well-developed, well-nourished, and in no distress.  HENT:  Head: Normocephalic and atraumatic.  Mouth/Throat: Oropharynx is clear and moist. No oropharyngeal exudate.  Eyes: Pupils are equal, round, and reactive to light.  Neck: Normal range of motion. Neck supple.  Cardiovascular: Normal rate and regular rhythm.  Pulmonary/Chest: No respiratory distress. He has no wheezes.  Abdominal: Soft. Bowel sounds are normal. He exhibits no distension and no mass. There is no abdominal tenderness. There is no rebound and no guarding.  Musculoskeletal: Normal range of motion.        General: No tenderness or edema.  Neurological: He is alert and oriented to person, place, and time.  Skin: Skin is warm.  Psychiatric: Affect normal.    LABORATORY DATA:  I have reviewed the data as listed Lab Results  Component Value Date   WBC 4.2 07/12/2019   HGB 9.8 (L) 07/12/2019   HCT 30.9  (L) 07/12/2019   MCV 95.1 07/12/2019   PLT 99 (L) 07/12/2019   Recent Labs    03/15/19 1206 05/24/19 1322 06/28/19 1351 07/12/19 1312  NA 136 139 139 138  K 4.8 4.7 5.2* 4.7  CL 107 109 106 105  CO2 _0 GLUCOSE 100* 103* 135* 103*  BUN 26* 30* 28* 29*  CREATININE 1.61* 1.52* 1.82* 1.79*  CALCIUM 9.0 9.0 9.4 9.2  GFRNONAA 40* 43* 35* 36*  GFRAA 47* 50* 40* 41*  PROT 8.8*  --   --   --   ALBUMIN 4.4  --   --   --   AST 19  --   --   --   ALT 12  --   --   --  ALKPHOS 55  --   --   --   BILITOT 0.9  --   --   --      Us Abdomen Complete  Result Date: 06/21/2019 CLINICAL DATA:  Normocytic anemia EXAM: ABDOMEN ULTRASOUND COMPLETE COMPARISON:  None. FINDINGS: Gallbladder: Gallbladder is well distended. Multiple small calculi are identified. No wall thickening or pericholecystic fluid is seen. Common bile duct: Diameter: 4.3 mm. Liver: Mild nodularity to the contour of the liver is noted. No focal mass is seen. Portal vein is patent on color Doppler imaging with normal direction of blood flow towards the liver. IVC: No abnormality visualized. Pancreas: Visualized portion unremarkable. Spleen: Size and appearance within normal limits. Right Kidney: Length: 9.8 cm. No mass lesion or hydronephrosis is noted. Mild cortical thinning is seen. Left Kidney: Length: 10.1 cm. No mass lesion or hydronephrosis is noted. Mild cortical thinning is seen. Abdominal aorta: Atherosclerotic changes without evidence of aneurysmal dilatation. Other findings: None. 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 Small gallstones without complicating factors. Mild cortical thinning in the kidneys bilaterally. Electronically Signed   By: Mark  Lukens M.D.   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  Continue retacrit/IV Iron; proceed with retacrit today.    # Chronic mild thrombocytopenia platelets-~? Cirrhosis of liver; but spleen- WNL; 99/ stable.   # MGUS-IgG kappa 1 g/dL; kappa lambda light chain ratio 1.9;-monitor for now.   # DISPOSITION:  # Retracit SQ today. # in 2 week- H&H Retacrit # in 4 weeks- H&H- Retacrit # follow up in 6 weeks- MD;labs- cbc/bmp;Retacrit; Dr.B   All questions were answered. The patient knows to call the clinic with any problems, questions or concerns.    Govinda R Brahmanday, MD 07/13/2019 7:45 AM   

## 2019-07-23 ENCOUNTER — Other Ambulatory Visit: Payer: Self-pay

## 2019-07-23 ENCOUNTER — Other Ambulatory Visit: Payer: Self-pay | Admitting: *Deleted

## 2019-07-23 DIAGNOSIS — N183 Chronic kidney disease, stage 3 unspecified: Secondary | ICD-10-CM

## 2019-07-23 DIAGNOSIS — D631 Anemia in chronic kidney disease: Secondary | ICD-10-CM

## 2019-07-26 ENCOUNTER — Inpatient Hospital Stay: Payer: Medicare Other

## 2019-07-26 ENCOUNTER — Other Ambulatory Visit: Payer: Self-pay

## 2019-07-26 VITALS — BP 117/60 | HR 61

## 2019-07-26 DIAGNOSIS — D631 Anemia in chronic kidney disease: Secondary | ICD-10-CM

## 2019-07-26 DIAGNOSIS — N183 Chronic kidney disease, stage 3 unspecified: Secondary | ICD-10-CM

## 2019-07-26 LAB — HEMOGLOBIN: Hemoglobin: 9.8 g/dL — ABNORMAL LOW (ref 13.0–17.0)

## 2019-07-26 LAB — HEMATOCRIT: HCT: 31 % — ABNORMAL LOW (ref 39.0–52.0)

## 2019-07-26 MED ORDER — EPOETIN ALFA-EPBX 10000 UNIT/ML IJ SOLN
20000.0000 [IU] | Freq: Once | INTRAMUSCULAR | Status: AC
  Administered 2019-07-26: 20000 [IU] via SUBCUTANEOUS
  Filled 2019-07-26: qty 2

## 2019-08-09 ENCOUNTER — Other Ambulatory Visit: Payer: Self-pay

## 2019-08-09 ENCOUNTER — Inpatient Hospital Stay: Payer: Medicare Other

## 2019-08-09 ENCOUNTER — Inpatient Hospital Stay: Payer: Medicare Other | Attending: Internal Medicine | Admitting: *Deleted

## 2019-08-09 DIAGNOSIS — D631 Anemia in chronic kidney disease: Secondary | ICD-10-CM | POA: Diagnosis present

## 2019-08-09 DIAGNOSIS — N183 Chronic kidney disease, stage 3 unspecified: Secondary | ICD-10-CM

## 2019-08-09 LAB — HEMOGLOBIN: Hemoglobin: 10 g/dL — ABNORMAL LOW (ref 13.0–17.0)

## 2019-08-09 LAB — HEMATOCRIT: HCT: 31.2 % — ABNORMAL LOW (ref 39.0–52.0)

## 2019-08-20 ENCOUNTER — Other Ambulatory Visit: Payer: Self-pay

## 2019-08-23 ENCOUNTER — Inpatient Hospital Stay: Payer: Medicare Other

## 2019-08-23 ENCOUNTER — Inpatient Hospital Stay (HOSPITAL_BASED_OUTPATIENT_CLINIC_OR_DEPARTMENT_OTHER): Payer: Medicare Other | Admitting: Internal Medicine

## 2019-08-23 ENCOUNTER — Other Ambulatory Visit: Payer: Self-pay

## 2019-08-23 DIAGNOSIS — N183 Chronic kidney disease, stage 3 unspecified: Secondary | ICD-10-CM

## 2019-08-23 DIAGNOSIS — D631 Anemia in chronic kidney disease: Secondary | ICD-10-CM

## 2019-08-23 LAB — COMPREHENSIVE METABOLIC PANEL
ALT: 14 U/L (ref 0–44)
AST: 18 U/L (ref 15–41)
Albumin: 4.4 g/dL (ref 3.5–5.0)
Alkaline Phosphatase: 56 U/L (ref 38–126)
Anion gap: 6 (ref 5–15)
BUN: 32 mg/dL — ABNORMAL HIGH (ref 8–23)
CO2: 25 mmol/L (ref 22–32)
Calcium: 9.1 mg/dL (ref 8.9–10.3)
Chloride: 106 mmol/L (ref 98–111)
Creatinine, Ser: 1.72 mg/dL — ABNORMAL HIGH (ref 0.61–1.24)
GFR calc Af Amer: 43 mL/min — ABNORMAL LOW (ref >60)
GFR calc non Af Amer: 37 mL/min — ABNORMAL LOW (ref >60)
Glucose, Bld: 95 mg/dL (ref 70–99)
Potassium: 4.6 mmol/L (ref 3.5–5.1)
Sodium: 137 mmol/L (ref 135–145)
Total Bilirubin: 1.1 mg/dL (ref 0.3–1.2)
Total Protein: 8.3 g/dL — ABNORMAL HIGH (ref 6.5–8.1)

## 2019-08-23 LAB — CBC WITH DIFFERENTIAL/PLATELET
Abs Immature Granulocytes: 0.01 10*3/uL (ref 0.00–0.07)
Basophils Absolute: 0.1 10*3/uL (ref 0.0–0.1)
Basophils Relative: 1 %
Eosinophils Absolute: 0.2 10*3/uL (ref 0.0–0.5)
Eosinophils Relative: 3 %
HCT: 28.8 % — ABNORMAL LOW (ref 39.0–52.0)
Hemoglobin: 9 g/dL — ABNORMAL LOW (ref 13.0–17.0)
Immature Granulocytes: 0 %
Lymphocytes Relative: 18 %
Lymphs Abs: 1 10*3/uL (ref 0.7–4.0)
MCH: 29 pg (ref 26.0–34.0)
MCHC: 31.3 g/dL (ref 30.0–36.0)
MCV: 92.9 fL (ref 80.0–100.0)
Monocytes Absolute: 0.8 10*3/uL (ref 0.1–1.0)
Monocytes Relative: 14 %
Neutro Abs: 3.5 10*3/uL (ref 1.7–7.7)
Neutrophils Relative %: 64 %
Platelets: 88 10*3/uL — ABNORMAL LOW (ref 150–400)
RBC: 3.1 MIL/uL — ABNORMAL LOW (ref 4.22–5.81)
RDW: 14.9 % (ref 11.5–15.5)
WBC: 5.6 10*3/uL (ref 4.0–10.5)
nRBC: 0 % (ref 0.0–0.2)

## 2019-08-23 MED ORDER — EPOETIN ALFA-EPBX 10000 UNIT/ML IJ SOLN
20000.0000 [IU] | Freq: Once | INTRAMUSCULAR | Status: AC
  Administered 2019-08-23: 20000 [IU] via SUBCUTANEOUS
  Filled 2019-08-23: qty 2

## 2019-08-23 NOTE — Progress Notes (Signed)
[facility] NOTE  Patient Care Team: Kirk Ruths, MD as PCP - General (Internal Medicine)  CHIEF COMPLAINTS/PURPOSE OF CONSULTATION: Anemia of chronic kidney disease/thrombocytopenia   HEMATOLOGY HISTORY  # CHRONIC ANEMIA- [at least 2018]- worsening- July 2020- 9/ nomocytic; NO Coloscopy; EGD- [place], 2017; October 2020-bone marrow biopsy-no overt myelodysplasia noted; subtle dyspoietic changes noted/karyotype normal; MDS FISH[[facility]]- NEG panel; HOLD off NGS.   #Chronic mild thrombocytopenia [120s]-question etiology; ? 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  Patient did not receive Retacrit 2 weeks ago as his hemoglobin was 10.  Overall also improvement of his fatigue and shortness of breath -with Retacrit.  Continues to have mild shortness of breath with exertion.  No headaches.  Review of Systems  Constitutional: Positive for malaise/fatigue. Negative for chills, diaphoresis, fever and weight loss.  HENT: Negative for nosebleeds and sore throat.   Eyes: Negative for double vision.  Respiratory: Positive for shortness of breath. Negative for cough, hemoptysis, sputum production and wheezing.   Cardiovascular: Negative for chest pain, palpitations, orthopnea and leg swelling.  Gastrointestinal: Negative for abdominal pain, blood in stool, constipation, diarrhea, heartburn, melena, nausea and vomiting.  Genitourinary: Negative for dysuria, frequency and urgency.  Musculoskeletal: Negative for back pain and joint pain.  Skin: Negative.  Negative for itching and rash.  Neurological: Negative for dizziness, tingling, focal weakness, weakness and headaches.   Endo/Heme/Allergies: Does not bruise/bleed easily.  Psychiatric/Behavioral: Negative for depression. The patient is not nervous/anxious and does not have insomnia.     MEDICAL HISTORY:  Past Medical History:  Diagnosis Date  . Atrial fibrillation ([place])   . GAD (generalized anxiety disorder)   . Hypertension   . Prediabetes     SURGICAL HISTORY: Past Surgical History:  Procedure Laterality Date  . supraorbital artery repair  2005  . TOOTH EXTRACTION      SOCIAL HISTORY: Social History   Socioeconomic History  . Marital status: Single    Spouse name: Not on file  . Number of children: Not on file  . Years of education: Not on file  . Highest education level: Not on file  Occupational History  . Not on file  Tobacco Use  . Smoking status: Former Smoker    Types: Cigarettes  Substance and Sexual Activity  . Alcohol use: Never  . Drug use: Never  . Sexual activity: Not on file  Other Topics Concern  . 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    Social Determinants of Health   Financial Resource Strain:   . Difficulty of Paying Living Expenses: Not on file  Food Insecurity:   . Worried About Charity fundraiser in the Last Year: Not on file  . Ran Out of Food in the Last Year: Not on file  Transportation Needs:   . Lack of Transportation (Medical): Not on file  . Lack of Transportation (Non-Medical): Not on file  Physical Activity:   . Days of Exercise per Week: Not on file  . Minutes of Exercise per Session: Not on file  Stress:   . Feeling of Stress : Not on file  Social  Connections:   . Frequency of Communication with Friends and Family: Not on file  . Frequency of Social Gatherings with Friends and Family: Not on file  . Attends Religious Services: Not on file  . Active Member of Clubs or Organizations: Not on file  . Attends Archivist Meetings: Not on file  .  Marital Status: Not on file  Intimate Partner Violence:   . Fear of Current or Ex-Partner: Not on file  . Emotionally Abused: Not on file  . Physically Abused: Not on file  . Sexually Abused: Not on file    FAMILY HISTORY: Family History  Problem Relation Age of Onset  . Lung cancer Father     ALLERGIES:  is allergic to bupropion and amiodarone.  MEDICATIONS:  Current Outpatient Medications  Medication Sig Dispense Refill  . ALPRAZolam (XANAX) 0.5 MG tablet Take 0.5 mg by mouth 2 (two) times daily.     Marland Kitchen amLODipine (NORVASC) 5 MG tablet Take 5 mg by mouth daily.     . furosemide (LASIX) 20 MG tablet Take 20 mg by mouth daily.    . metoprolol tartrate (LOPRESSOR) 25 MG tablet Take 25 mg by mouth daily.     . sertraline (ZOLOFT) 50 MG tablet Take 50 mg by mouth daily.     . sotalol (BETAPACE) 80 MG tablet Take 80 mg by mouth daily.     Marland Kitchen warfarin (COUMADIN) 3 MG tablet Take as directed-Take  1 tablet by mouth  on Monday,Wednesday,Friday and 1.5 tablets by mouth all other days     No current facility-administered medications for this visit.      PHYSICAL EXAMINATION:   Vitals:   08/23/19 1304  BP: 121/69  Pulse: (!) 50  Temp: (!) 96.3 F (35.7 C)   Filed Weights   08/23/19 1304  Weight: 176 lb (79.8 kg)    Physical Exam  Constitutional: He is oriented to person, place, and time and well-developed, well-nourished, and in no distress.  HENT:  Head: Normocephalic and atraumatic.  Mouth/Throat: Oropharynx is clear and moist. No oropharyngeal exudate.  Eyes: Pupils are equal, round, and reactive to light.  Cardiovascular: Normal rate and regular rhythm.  Pulmonary/Chest: No respiratory distress. He has no wheezes.  Abdominal: Soft. Bowel sounds are normal. He exhibits no distension and no mass. There is no abdominal tenderness. There is no rebound and no guarding.  Musculoskeletal:        General: No tenderness or edema. Normal range of motion.     Cervical back:  Normal range of motion and neck supple.  Neurological: He is alert and oriented to person, place, and time.  Skin: Skin is warm.  Psychiatric: Affect normal.    LABORATORY DATA:  I have reviewed the data as listed Lab Results  Component Value Date   WBC 5.6 08/23/2019   HGB 9.0 (L) 08/23/2019   HCT 28.8 (L) 08/23/2019   MCV 92.9 08/23/2019   PLT 88 (L) 08/23/2019   Recent Labs    03/15/19 1206 06/28/19 1351 07/12/19 1312 08/23/19 1239  NA 136 139 138 137  K 4.8 5.2* 4.7 4.6  CL 107 106 105 106  CO2 '23 24 23 25  ' GLUCOSE 100* 135* 103* 95  BUN 26* 28* 29* 32*  CREATININE 1.61* 1.82* 1.79* 1.72*  CALCIUM 9.0 9.4 9.2 9.1  GFRNONAA 40* 35* 36* 37*  GFRAA 47* 40* 41* 43*  PROT 8.8*  --   --  8.3*  ALBUMIN 4.4  --   --  4.4  AST 19  --   --  18  ALT 12  --   --  14  ALKPHOS 55  --   --  56  BILITOT 0.9  --   --  1.1     No results found. Anemia of chronic kidney failure, stage 3 (moderate) #Anemia secondary to CKD/mild iron deficiency[ saturation 24%; ferritin 58].  Continue retacrit/IV Iron; proceed with retacrit today.   # Chronic mild thrombocytopenia platelets-~? Cirrhosis of liver; but spleen Vs.ITP-88; slightly lower than baseline.  Patient on chronic anticoagulation with Coumadin.  Currently asymptomatic; Discussed the use of steroids/ TPO oral pills if needed; monitor for now.  I will have a low threshold to start treatment as patient is on Coumadin.  #History of A. 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   # DISPOSITION:  # Retracit SQ today. # in 2 week- H&H Retacrit # in 4 weeks- H&H- Retacrit # follow up in 6 weeks- MD;labs- cbc/bmp;iron studies/ferrtin-Retacrit; Dr.B   All questions were answered. The patient knows to call the clinic with any problems, questions or concerns.    Cammie Sickle, MD 08/24/2019 7:37 AM

## 2019-08-23 NOTE — Assessment & Plan Note (Addendum)
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  Continue retacrit/IV Iron; proceed with retacrit today.   # Chronic mild thrombocytopenia platelets-~? Cirrhosis of liver; but spleen Vs.ITP-88; slightly lower than baseline.  Patient on chronic anticoagulation with Coumadin.  Currently asymptomatic; Discussed the use of steroids/ TPO oral pills if needed; monitor for now.  I will have a low threshold to start treatment as patient is on Coumadin.  #History of A. 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   # DISPOSITION:  # Retracit SQ today. # in 2 week- H&H Retacrit # in 4 weeks- H&H- Retacrit # follow up in 6 weeks- MD;labs- cbc/bmp;iron studies/ferrtin-Retacrit; Dr.B

## 2019-09-06 ENCOUNTER — Inpatient Hospital Stay: Payer: Medicare Other

## 2019-09-06 ENCOUNTER — Other Ambulatory Visit: Payer: Self-pay

## 2019-09-06 ENCOUNTER — Inpatient Hospital Stay: Payer: Medicare Other | Attending: Internal Medicine

## 2019-09-06 VITALS — BP 125/53 | HR 50 | Temp 95.7°F

## 2019-09-06 DIAGNOSIS — D631 Anemia in chronic kidney disease: Secondary | ICD-10-CM

## 2019-09-06 DIAGNOSIS — N183 Chronic kidney disease, stage 3 unspecified: Secondary | ICD-10-CM | POA: Insufficient documentation

## 2019-09-06 LAB — HEMOGLOBIN: Hemoglobin: 8.8 g/dL — ABNORMAL LOW (ref 13.0–17.0)

## 2019-09-06 LAB — HEMATOCRIT: HCT: 28 % — ABNORMAL LOW (ref 39.0–52.0)

## 2019-09-06 MED ORDER — EPOETIN ALFA-EPBX 10000 UNIT/ML IJ SOLN
20000.0000 [IU] | Freq: Once | INTRAMUSCULAR | Status: AC
  Administered 2019-09-06: 20000 [IU] via SUBCUTANEOUS
  Filled 2019-09-06: qty 2

## 2019-09-17 ENCOUNTER — Other Ambulatory Visit: Payer: Self-pay

## 2019-09-20 ENCOUNTER — Inpatient Hospital Stay: Payer: Medicare Other

## 2019-09-20 ENCOUNTER — Other Ambulatory Visit: Payer: Self-pay

## 2019-09-20 VITALS — BP 127/52 | HR 48

## 2019-09-20 DIAGNOSIS — N183 Chronic kidney disease, stage 3 unspecified: Secondary | ICD-10-CM | POA: Diagnosis not present

## 2019-09-20 DIAGNOSIS — D631 Anemia in chronic kidney disease: Secondary | ICD-10-CM

## 2019-09-20 LAB — HEMATOCRIT: HCT: 27.9 % — ABNORMAL LOW (ref 39.0–52.0)

## 2019-09-20 LAB — HEMOGLOBIN: Hemoglobin: 8.6 g/dL — ABNORMAL LOW (ref 13.0–17.0)

## 2019-09-20 MED ORDER — EPOETIN ALFA-EPBX 10000 UNIT/ML IJ SOLN
20000.0000 [IU] | Freq: Once | INTRAMUSCULAR | Status: AC
  Administered 2019-09-20: 20000 [IU] via SUBCUTANEOUS
  Filled 2019-09-20: qty 2

## 2019-10-04 ENCOUNTER — Inpatient Hospital Stay: Payer: Medicare Other | Attending: Internal Medicine

## 2019-10-04 ENCOUNTER — Inpatient Hospital Stay (HOSPITAL_BASED_OUTPATIENT_CLINIC_OR_DEPARTMENT_OTHER): Payer: Medicare Other | Admitting: Internal Medicine

## 2019-10-04 ENCOUNTER — Inpatient Hospital Stay: Payer: Medicare Other

## 2019-10-04 ENCOUNTER — Other Ambulatory Visit: Payer: Self-pay

## 2019-10-04 DIAGNOSIS — D631 Anemia in chronic kidney disease: Secondary | ICD-10-CM

## 2019-10-04 DIAGNOSIS — N183 Chronic kidney disease, stage 3 unspecified: Secondary | ICD-10-CM | POA: Diagnosis present

## 2019-10-04 LAB — FERRITIN: Ferritin: 132 ng/mL (ref 24–336)

## 2019-10-04 LAB — BASIC METABOLIC PANEL
Anion gap: 7 (ref 5–15)
BUN: 36 mg/dL — ABNORMAL HIGH (ref 8–23)
CO2: 24 mmol/L (ref 22–32)
Calcium: 9.2 mg/dL (ref 8.9–10.3)
Chloride: 106 mmol/L (ref 98–111)
Creatinine, Ser: 1.77 mg/dL — ABNORMAL HIGH (ref 0.61–1.24)
GFR calc Af Amer: 42 mL/min — ABNORMAL LOW (ref >60)
GFR calc non Af Amer: 36 mL/min — ABNORMAL LOW (ref >60)
Glucose, Bld: 92 mg/dL (ref 70–99)
Potassium: 4.8 mmol/L (ref 3.5–5.1)
Sodium: 137 mmol/L (ref 135–145)

## 2019-10-04 LAB — CBC WITH DIFFERENTIAL/PLATELET
Abs Immature Granulocytes: 0.01 10*3/uL (ref 0.00–0.07)
Basophils Absolute: 0.1 10*3/uL (ref 0.0–0.1)
Basophils Relative: 1 %
Eosinophils Absolute: 0.2 10*3/uL (ref 0.0–0.5)
Eosinophils Relative: 4 %
HCT: 26.9 % — ABNORMAL LOW (ref 39.0–52.0)
Hemoglobin: 8.4 g/dL — ABNORMAL LOW (ref 13.0–17.0)
Immature Granulocytes: 0 %
Lymphocytes Relative: 20 %
Lymphs Abs: 0.9 10*3/uL (ref 0.7–4.0)
MCH: 29.8 pg (ref 26.0–34.0)
MCHC: 31.2 g/dL (ref 30.0–36.0)
MCV: 95.4 fL (ref 80.0–100.0)
Monocytes Absolute: 0.7 10*3/uL (ref 0.1–1.0)
Monocytes Relative: 15 %
Neutro Abs: 2.7 10*3/uL (ref 1.7–7.7)
Neutrophils Relative %: 60 %
Platelets: 95 10*3/uL — ABNORMAL LOW (ref 150–400)
RBC: 2.82 MIL/uL — ABNORMAL LOW (ref 4.22–5.81)
RDW: 16.7 % — ABNORMAL HIGH (ref 11.5–15.5)
WBC: 4.5 10*3/uL (ref 4.0–10.5)
nRBC: 0 % (ref 0.0–0.2)

## 2019-10-04 LAB — IRON AND TIBC
Iron: 83 ug/dL (ref 45–182)
Saturation Ratios: 27 % (ref 17.9–39.5)
TIBC: 305 ug/dL (ref 250–450)
UIBC: 222 ug/dL

## 2019-10-04 MED ORDER — EPOETIN ALFA-EPBX 10000 UNIT/ML IJ SOLN
20000.0000 [IU] | Freq: Once | INTRAMUSCULAR | Status: AC
  Administered 2019-10-04: 20000 [IU] via SUBCUTANEOUS
  Filled 2019-10-04: qty 2

## 2019-10-04 NOTE — Progress Notes (Signed)
[facility] NOTE  Patient Care Team: Kirk Ruths, MD as PCP - General (Internal Medicine)  CHIEF COMPLAINTS/PURPOSE OF CONSULTATION: Anemia of chronic kidney disease/thrombocytopenia   HEMATOLOGY HISTORY  # CHRONIC ANEMIA- [at least 2018]- worsening- July 2020- 9/ nomocytic; NO Coloscopy; EGD- [place], 2017; October 2020-bone marrow biopsy-no overt myelodysplasia noted; subtle dyspoietic changes noted/karyotype normal; MDS FISH[ne[facility]]- NEG panel; HOLD off NGS.   #Chronic mild thrombocytopenia [120s]-question etiology; ? 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  Patient continues to complain of fatigue.  Continues to complain of shortness of breath on exertion.  No headaches.  No nausea no vomiting.    Review of Systems  Constitutional: Positive for malaise/fatigue. Negative for chills, diaphoresis, fever and weight loss.  HENT: Negative for nosebleeds and sore throat.   Eyes: Negative for double vision.  Respiratory: Positive for shortness of breath. Negative for cough, hemoptysis, sputum production and wheezing.   Cardiovascular: Negative for chest pain, palpitations, orthopnea and leg swelling.  Gastrointestinal: Negative for abdominal pain, blood in stool, constipation, diarrhea, heartburn, melena, nausea and vomiting.  Genitourinary: Negative for dysuria, frequency and urgency.  Musculoskeletal: Negative for back pain and joint pain.  Skin: Negative.  Negative for itching and rash.  Neurological: Negative for dizziness, tingling, focal weakness, weakness and headaches.  Endo/Heme/Allergies: Does not bruise/bleed easily.  Psychiatric/Behavioral: Negative for depression.  The patient is not nervous/anxious and does not have insomnia.     MEDICAL HISTORY:  Past Medical History:  Diagnosis Date  . Atrial fibrillation ([place])   . GAD (generalized anxiety disorder)   . Hypertension   . Prediabetes     SURGICAL HISTORY: Past Surgical History:  Procedure Laterality Date  . supraorbital artery repair  2005  . TOOTH EXTRACTION      SOCIAL HISTORY: Social History   Socioeconomic History  . Marital status: Single    Spouse name: Not on file  . Number of children: Not on file  . Years of education: Not on file  . Highest education level: Not on file  Occupational History  . Not on file  Tobacco Use  . Smoking status: Former Smoker    Types: Cigarettes  Substance and Sexual Activity  . Alcohol use: Never  . Drug use: Never  . Sexual activity: Not on file  Other Topics Concern  . 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    Social Determinants of Health   Financial Resource Strain:   . Difficulty of Paying Living Expenses: Not on file  Food Insecurity:   . Worried About Charity fundraiser in the Last Year: Not on file  . Ran Out of Food in the Last Year: Not on file  Transportation Needs:   . Lack of Transportation (Medical): Not on file  . Lack of Transportation (Non-Medical): Not on file  Physical Activity:   . Days of Exercise per Week: Not on file  . Minutes of Exercise per Session: Not on file  Stress:   . Feeling of Stress : Not on file  Social Connections:   . Frequency of Communication with Friends and Family: Not on  file  . Frequency of Social Gatherings with Friends and Family: Not on file  . Attends Religious Services: Not on file  . Active Member of Clubs or Organizations: Not on file  . Attends Archivist Meetings: Not on file  . Marital Status: Not on file  Intimate Partner Violence:   . Fear of Current or Ex-Partner: Not on file   . Emotionally Abused: Not on file  . Physically Abused: Not on file  . Sexually Abused: Not on file    FAMILY HISTORY: Family History  Problem Relation Age of Onset  . Lung cancer Father     ALLERGIES:  is allergic to bupropion and amiodarone.  MEDICATIONS:  Current Outpatient Medications  Medication Sig Dispense Refill  . ALPRAZolam (XANAX) 0.5 MG tablet Take 0.5 mg by mouth 2 (two) times daily.     Marland Kitchen amLODipine (NORVASC) 5 MG tablet Take 5 mg by mouth daily.     . furosemide (LASIX) 20 MG tablet Take 20 mg by mouth daily.    . metoprolol tartrate (LOPRESSOR) 25 MG tablet Take 12.5 mg by mouth daily.     . sotalol (BETAPACE) 80 MG tablet Take 80 mg by mouth daily.     Marland Kitchen warfarin (COUMADIN) 3 MG tablet Take as directed-Take  1 tablet by mouth  on Monday,Wednesday,Friday and 1.5 tablets by mouth all other days    . sertraline (ZOLOFT) 50 MG tablet Take 50 mg by mouth daily.      No current facility-administered medications for this visit.      PHYSICAL EXAMINATION:   Vitals:   10/04/19 1354  BP: (!) 137/48  Pulse: (!) 43  Temp: (!) 94.6 F (34.8 C)   Filed Weights   10/04/19 1354  Weight: 178 lb (80.7 kg)    Physical Exam  Constitutional: He is oriented to person, place, and time and well-developed, well-nourished, and in no distress.  HENT:  Head: Normocephalic and atraumatic.  Mouth/Throat: Oropharynx is clear and moist. No oropharyngeal exudate.  Eyes: Pupils are equal, round, and reactive to light.  Cardiovascular: Normal rate and regular rhythm.  Pulmonary/Chest: No respiratory distress. He has no wheezes.  Abdominal: Soft. Bowel sounds are normal. He exhibits no distension and no mass. There is no abdominal tenderness. There is no rebound and no guarding.  Musculoskeletal:        General: No tenderness or edema. Normal range of motion.     Cervical back: Normal range of motion and neck supple.  Neurological: He is alert and oriented to person, place, and  time.  Skin: Skin is warm.  Psychiatric: Affect normal.    LABORATORY DATA:  I have reviewed the data as listed Lab Results  Component Value Date   WBC 4.5 10/04/2019   HGB 8.4 (L) 10/04/2019   HCT 26.9 (L) 10/04/2019   MCV 95.4 10/04/2019   PLT 95 (L) 10/04/2019   Recent Labs    03/15/19 1206 05/24/19 1322 07/12/19 1312 08/23/19 1239 10/04/19 1343  NA 136   < > 138 137 137  K 4.8   < > 4.7 4.6 4.8  CL 107   < > 105 106 106  CO2 23   < > '23 25 24  ' GLUCOSE 100*   < > 103* 95 92  BUN 26*   < > 29* 32* 36*  CREATININE 1.61*   < > 1.79* 1.72* 1.77*  CALCIUM 9.0   < > 9.2 9.1 9.2  GFRNONAA 40*   < >  36* 37* 36*  GFRAA 47*   < > 41* 43* 42*  PROT 8.8*  --   --  8.3*  --   ALBUMIN 4.4  --   --  4.4  --   AST 19  --   --  18  --   ALT 12  --   --  14  --   ALKPHOS 55  --   --  56  --   BILITOT 0.9  --   --  1.1  --    < > = values in this interval not displayed.     No results found. Anemia of chronic kidney failure, stage 3 (moderate) #Anemia secondary to CKD/mild iron deficiency [saturation 24%; ferritin 58].  Continue retacrit/IV Iron; proceed with retacrit today.   #Today hemoglobin is 8.4; no significant improvement on Retacrit.  Would recommend checking iron studies and Venofer x3.  Discussed the rationale.  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  Monitor for now  #History of A. fib on Coumadin-followed by cardiology.   # I discussed regarding Covid-19 precautions.  I reviewed the vaccine effectiveness and potential side effects in detail.  Also discussed long-term effectiveness and safety profile are unclear at this time.  I discussed December, 2020 [employer] position statement-that all patients are recommended COVID-19 vaccinations [when available]-as long as they do not have allergy to components of the vaccine.  However, I think the benefits of the vaccination outweigh the potential risks. Re: [id number] vaccination  #  DISPOSITION:  # Retracit SQ today. # Venofer- this week; # in 2 week- H&H Retacrit/ d-2 venofer # in 4 weeks- H&H- Retacrit/d-2 venofer # follow up in 6 weeks- MD;labs- cbc/bmp;--Retacrit;d-2 venofer Dr.B   All questions were answered. The patient knows to call the clinic with any problems, questions or concerns.    Cammie Sickle, MD 10/05/2019 8:16 AM

## 2019-10-04 NOTE — Assessment & Plan Note (Addendum)
#  Anemia secondary to CKD/mild iron deficiency [saturation 24%; ferritin 58].  Continue retacrit/IV Iron; proceed with retacrit today.   #Today hemoglobin is 8.4; no significant improvement on Retacrit.  Would recommend checking iron studies and Venofer x3.  Discussed the rationale.  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  Monitor for now  #History of A. fib on Coumadin-followed by cardiology.   # I discussed regarding Covid-19 precautions.  I reviewed the vaccine effectiveness and potential side effects in detail.  Also discussed long-term effectiveness and safety profile are unclear at this time.  I discussed December, 2020 [employer] position statement-that all patients are recommended COVID-19 vaccinations [when available]-as long as they do not have allergy to components of the vaccine.  However, I think the benefits of the vaccination outweigh the potential risks. Re: [id number] vaccination  # DISPOSITION:  # Retracit SQ today. # Venofer- this week; # in 2 week- H&H Retacrit/ d-2 venofer # in 4 weeks- H&H- Retacrit/d-2 venofer # follow up in 6 weeks- MD;labs- cbc/bmp;--Retacrit;d-2 venofer Dr.B

## 2019-10-07 ENCOUNTER — Other Ambulatory Visit: Payer: Self-pay

## 2019-10-08 ENCOUNTER — Other Ambulatory Visit: Payer: Self-pay

## 2019-10-08 ENCOUNTER — Inpatient Hospital Stay: Payer: Medicare Other

## 2019-10-08 VITALS — BP 136/37 | HR 44 | Temp 95.0°F | Resp 16

## 2019-10-08 DIAGNOSIS — N183 Chronic kidney disease, stage 3 unspecified: Secondary | ICD-10-CM | POA: Diagnosis not present

## 2019-10-08 DIAGNOSIS — D631 Anemia in chronic kidney disease: Secondary | ICD-10-CM

## 2019-10-08 MED ORDER — SODIUM CHLORIDE 0.9 % IV SOLN
Freq: Once | INTRAVENOUS | Status: AC
  Filled 2019-10-08: qty 250

## 2019-10-08 MED ORDER — SODIUM CHLORIDE 0.9 % IV SOLN
510.0000 mg | Freq: Once | INTRAVENOUS | Status: AC
  Administered 2019-10-08: 14:00:00 510 mg via INTRAVENOUS
  Filled 2019-10-08: qty 17

## 2019-10-08 MED ORDER — ACETAMINOPHEN 325 MG PO TABS
650.0000 mg | ORAL_TABLET | Freq: Once | ORAL | Status: AC
  Administered 2019-10-08: 14:00:00 650 mg via ORAL
  Filled 2019-10-08: qty 2

## 2019-10-08 NOTE — Progress Notes (Signed)
1345: Reviewed orders with Dr. Rogue Bussing pt to receive Feraheme as ordered.

## 2019-10-08 NOTE — Progress Notes (Signed)
Pt tolerated infusion well. Pt denies any concerns, no s/s of distress noted. VS WNL. Pt and VS stable at discharge.

## 2019-10-18 ENCOUNTER — Other Ambulatory Visit: Payer: Self-pay

## 2019-10-18 ENCOUNTER — Inpatient Hospital Stay: Payer: Medicare Other

## 2019-10-18 VITALS — BP 125/51 | HR 45

## 2019-10-18 DIAGNOSIS — D631 Anemia in chronic kidney disease: Secondary | ICD-10-CM

## 2019-10-18 DIAGNOSIS — N183 Chronic kidney disease, stage 3 unspecified: Secondary | ICD-10-CM | POA: Diagnosis not present

## 2019-10-18 LAB — HEMOGLOBIN: Hemoglobin: 9.1 g/dL — ABNORMAL LOW (ref 13.0–17.0)

## 2019-10-18 LAB — HEMATOCRIT: HCT: 29.3 % — ABNORMAL LOW (ref 39.0–52.0)

## 2019-10-18 MED ORDER — EPOETIN ALFA-EPBX 10000 UNIT/ML IJ SOLN
20000.0000 [IU] | Freq: Once | INTRAMUSCULAR | Status: AC
  Administered 2019-10-18: 15:00:00 20000 [IU] via SUBCUTANEOUS
  Filled 2019-10-18: qty 2

## 2019-10-19 ENCOUNTER — Inpatient Hospital Stay: Payer: Medicare Other

## 2019-10-19 ENCOUNTER — Other Ambulatory Visit: Payer: Self-pay

## 2019-10-19 VITALS — BP 111/56 | HR 52 | Temp 97.2°F | Resp 18

## 2019-10-19 DIAGNOSIS — N183 Chronic kidney disease, stage 3 unspecified: Secondary | ICD-10-CM | POA: Diagnosis not present

## 2019-10-19 DIAGNOSIS — D631 Anemia in chronic kidney disease: Secondary | ICD-10-CM

## 2019-10-19 MED ORDER — ACETAMINOPHEN 325 MG PO TABS
650.0000 mg | ORAL_TABLET | Freq: Once | ORAL | Status: AC
  Administered 2019-10-19: 14:00:00 650 mg via ORAL
  Filled 2019-10-19: qty 2

## 2019-10-19 MED ORDER — SODIUM CHLORIDE 0.9 % IV SOLN
Freq: Once | INTRAVENOUS | Status: AC
  Filled 2019-10-19: qty 250

## 2019-10-19 MED ORDER — SODIUM CHLORIDE 0.9 % IV SOLN
510.0000 mg | Freq: Once | INTRAVENOUS | Status: AC
  Administered 2019-10-19: 510 mg via INTRAVENOUS
  Filled 2019-10-19: qty 17

## 2019-11-01 ENCOUNTER — Inpatient Hospital Stay: Payer: Medicare Other

## 2019-11-01 ENCOUNTER — Inpatient Hospital Stay: Payer: Medicare Other | Attending: Internal Medicine

## 2019-11-01 ENCOUNTER — Other Ambulatory Visit: Payer: Self-pay

## 2019-11-01 VITALS — BP 122/46 | HR 53

## 2019-11-01 DIAGNOSIS — D696 Thrombocytopenia, unspecified: Secondary | ICD-10-CM | POA: Insufficient documentation

## 2019-11-01 DIAGNOSIS — Z87891 Personal history of nicotine dependence: Secondary | ICD-10-CM | POA: Insufficient documentation

## 2019-11-01 DIAGNOSIS — I4891 Unspecified atrial fibrillation: Secondary | ICD-10-CM | POA: Insufficient documentation

## 2019-11-01 DIAGNOSIS — D631 Anemia in chronic kidney disease: Secondary | ICD-10-CM | POA: Diagnosis present

## 2019-11-01 DIAGNOSIS — N183 Chronic kidney disease, stage 3 unspecified: Secondary | ICD-10-CM | POA: Insufficient documentation

## 2019-11-01 DIAGNOSIS — Z7901 Long term (current) use of anticoagulants: Secondary | ICD-10-CM | POA: Diagnosis not present

## 2019-11-01 LAB — HEMOGLOBIN: Hemoglobin: 9.5 g/dL — ABNORMAL LOW (ref 13.0–17.0)

## 2019-11-01 LAB — HEMATOCRIT: HCT: 30.8 % — ABNORMAL LOW (ref 39.0–52.0)

## 2019-11-01 MED ORDER — EPOETIN ALFA-EPBX 10000 UNIT/ML IJ SOLN
20000.0000 [IU] | Freq: Once | INTRAMUSCULAR | Status: AC
  Administered 2019-11-01: 20000 [IU] via SUBCUTANEOUS
  Filled 2019-11-01: qty 2

## 2019-11-02 ENCOUNTER — Other Ambulatory Visit: Payer: Self-pay

## 2019-11-02 ENCOUNTER — Inpatient Hospital Stay: Payer: Medicare Other

## 2019-11-02 VITALS — BP 122/42 | HR 50 | Temp 97.0°F | Resp 18

## 2019-11-02 DIAGNOSIS — D631 Anemia in chronic kidney disease: Secondary | ICD-10-CM

## 2019-11-02 DIAGNOSIS — N183 Chronic kidney disease, stage 3 unspecified: Secondary | ICD-10-CM | POA: Diagnosis not present

## 2019-11-02 MED ORDER — SODIUM CHLORIDE 0.9 % IV SOLN
Freq: Once | INTRAVENOUS | Status: AC
  Filled 2019-11-02: qty 250

## 2019-11-02 MED ORDER — SODIUM CHLORIDE 0.9 % IV SOLN
510.0000 mg | Freq: Once | INTRAVENOUS | Status: AC
  Administered 2019-11-02: 510 mg via INTRAVENOUS
  Filled 2019-11-02: qty 510

## 2019-11-02 MED ORDER — ACETAMINOPHEN 325 MG PO TABS
650.0000 mg | ORAL_TABLET | Freq: Once | ORAL | Status: AC
  Administered 2019-11-02: 650 mg via ORAL
  Filled 2019-11-02: qty 2

## 2019-11-12 ENCOUNTER — Other Ambulatory Visit: Payer: Self-pay

## 2019-11-12 NOTE — Progress Notes (Signed)
Patient has received the pfizer covid vaccine. He will bring the vaccination card with the vaccine dates.

## 2019-11-15 ENCOUNTER — Telehealth: Payer: Self-pay | Admitting: *Deleted

## 2019-11-15 ENCOUNTER — Inpatient Hospital Stay: Payer: Medicare Other

## 2019-11-15 ENCOUNTER — Inpatient Hospital Stay (HOSPITAL_BASED_OUTPATIENT_CLINIC_OR_DEPARTMENT_OTHER): Payer: Medicare Other | Admitting: Internal Medicine

## 2019-11-15 DIAGNOSIS — D631 Anemia in chronic kidney disease: Secondary | ICD-10-CM | POA: Diagnosis not present

## 2019-11-15 DIAGNOSIS — N183 Chronic kidney disease, stage 3 unspecified: Secondary | ICD-10-CM | POA: Diagnosis not present

## 2019-11-15 LAB — CBC WITH DIFFERENTIAL/PLATELET
Abs Immature Granulocytes: 0.01 10*3/uL (ref 0.00–0.07)
Basophils Absolute: 0.1 10*3/uL (ref 0.0–0.1)
Basophils Relative: 2 %
Eosinophils Absolute: 0.2 10*3/uL (ref 0.0–0.5)
Eosinophils Relative: 4 %
HCT: 31.1 % — ABNORMAL LOW (ref 39.0–52.0)
Hemoglobin: 10.2 g/dL — ABNORMAL LOW (ref 13.0–17.0)
Immature Granulocytes: 0 %
Lymphocytes Relative: 23 %
Lymphs Abs: 0.9 10*3/uL (ref 0.7–4.0)
MCH: 30.8 pg (ref 26.0–34.0)
MCHC: 32.8 g/dL (ref 30.0–36.0)
MCV: 94 fL (ref 80.0–100.0)
Monocytes Absolute: 0.5 10*3/uL (ref 0.1–1.0)
Monocytes Relative: 13 %
Neutro Abs: 2.4 10*3/uL (ref 1.7–7.7)
Neutrophils Relative %: 58 %
Platelets: 76 10*3/uL — ABNORMAL LOW (ref 150–400)
RBC: 3.31 MIL/uL — ABNORMAL LOW (ref 4.22–5.81)
RDW: 15.8 % — ABNORMAL HIGH (ref 11.5–15.5)
WBC: 4.1 10*3/uL (ref 4.0–10.5)
nRBC: 0 % (ref 0.0–0.2)

## 2019-11-15 LAB — BASIC METABOLIC PANEL
Anion gap: 4 — ABNORMAL LOW (ref 5–15)
BUN: 34 mg/dL — ABNORMAL HIGH (ref 8–23)
CO2: 25 mmol/L (ref 22–32)
Calcium: 8.8 mg/dL — ABNORMAL LOW (ref 8.9–10.3)
Chloride: 106 mmol/L (ref 98–111)
Creatinine, Ser: 1.75 mg/dL — ABNORMAL HIGH (ref 0.61–1.24)
GFR calc Af Amer: 42 mL/min — ABNORMAL LOW (ref >60)
GFR calc non Af Amer: 36 mL/min — ABNORMAL LOW (ref >60)
Glucose, Bld: 104 mg/dL — ABNORMAL HIGH (ref 70–99)
Potassium: 4.6 mmol/L (ref 3.5–5.1)
Sodium: 135 mmol/L (ref 135–145)

## 2019-11-15 MED ORDER — PREDNISONE 20 MG PO TABS: ORAL_TABLET | ORAL | 0 refills | Status: DC

## 2019-11-15 NOTE — Progress Notes (Signed)
[facility] CONSULT NOTE  Patient Care Team: Anderson, Marshall W, MD as PCP - General (Internal Medicine)  CHIEF COMPLAINTS/PURPOSE OF CONSULTATION: Anemia of chronic kidney disease/thrombocytopenia   HEMATOLOGY HISTORY  # CHRONIC ANEMIA- [at least 2018]- worsening- July 2020- 9/ nomocytic; NO Coloscopy; EGD- [place], 2017; October 2020-bone marrow biopsy-no overt myelodysplasia noted; subtle dyspoietic changes noted/karyotype normal; MDS FISH[neogenomics]- NEG panel; HOLD off NGS.   #Chronic mild thrombocytopenia [120s]-question etiology; ? 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  Patient received IV iron infusion last few weeks.  Notes have improvement of energy levels.  Denies any gum bleeding nosebleeds.  Denies any easy bruising.  No skin rash.  No headaches.  No nausea no vomiting.  Review of Systems  Constitutional: Positive for malaise/fatigue. Negative for chills, diaphoresis, fever and weight loss.  HENT: Negative for nosebleeds and sore throat.   Eyes: Negative for double vision.  Respiratory: Positive for shortness of breath. Negative for cough, hemoptysis, sputum production and wheezing.   Cardiovascular: Negative for chest pain, palpitations, orthopnea and leg swelling.  Gastrointestinal: Negative for abdominal pain, blood in stool, constipation, diarrhea, heartburn, melena, nausea and vomiting.  Genitourinary: Negative for dysuria, frequency and urgency.  Musculoskeletal: Negative for back pain and joint pain.  Skin: Negative.  Negative for itching and rash.  Neurological: Negative for dizziness, tingling, focal weakness, weakness and headaches.   Endo/Heme/Allergies: Does not bruise/bleed easily.  Psychiatric/Behavioral: Negative for depression. The patient is not nervous/anxious and does not have insomnia.     MEDICAL HISTORY:  Past Medical History:  Diagnosis Date  . Atrial fibrillation (HCC)   . GAD (generalized anxiety disorder)   . Hypertension   . Prediabetes     SURGICAL HISTORY: Past Surgical History:  Procedure Laterality Date  . supraorbital artery repair  2005  . TOOTH EXTRACTION      SOCIAL HISTORY: Social History   Socioeconomic History  . Marital status: Single    Spouse name: Not on file  . Number of children: Not on file  . Years of education: Not on file  . Highest education level: Not on file  Occupational History  . Not on file  Tobacco Use  . Smoking status: Former Smoker    Types: Cigarettes  Substance and Sexual Activity  . Alcohol use: Never  . Drug use: Never  . Sexual activity: Not on file  Other Topics Concern  . 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    Social Determinants of Health   Financial Resource Strain:   . Difficulty of Paying Living Expenses:   Food Insecurity:   . Worried About Running Out of Food in the Last Year:   . Ran Out of Food in the Last Year:   Transportation Needs:   . Lack of Transportation (Medical):   . Lack of Transportation (Non-Medical):   Physical Activity:   . Days of Exercise per Week:   . Minutes of Exercise per Session:   Stress:   . Feeling of Stress :   Social Connections:   . Frequency of Communication with Friends and Family:   .   Frequency of Social Gatherings with Friends and Family:   . Attends Religious Services:   . Active Member of Clubs or Organizations:   . Attends Archivist Meetings:   Marland Kitchen Marital Status:   Intimate Partner Violence:   . Fear of Current or Ex-Partner:   . Emotionally Abused:   Marland Kitchen Physically Abused:   . Sexually  Abused:     FAMILY HISTORY: Family History  Problem Relation Age of Onset  . Lung cancer Father     ALLERGIES:  is allergic to bupropion and amiodarone.  MEDICATIONS:  Current Outpatient Medications  Medication Sig Dispense Refill  . ALPRAZolam (XANAX) 0.5 MG tablet Take 0.5 mg by mouth 2 (two) times daily.     Marland Kitchen amLODipine (NORVASC) 5 MG tablet Take 5 mg by mouth daily.     . furosemide (LASIX) 20 MG tablet Take 20 mg by mouth daily.    . metoprolol tartrate (LOPRESSOR) 25 MG tablet Take 12.5 mg by mouth daily.     . sertraline (ZOLOFT) 50 MG tablet Take 50 mg by mouth daily.     . sotalol (BETAPACE) 80 MG tablet Take 80 mg by mouth daily.     Marland Kitchen warfarin (COUMADIN) 3 MG tablet Take as directed-Take  1 tablet by mouth  on Monday,Wednesday,Friday and 1.5 tablets by mouth all other days    . predniSONE (DELTASONE) 20 MG tablet Take 2 pills a day with food in AM. Do not stop until. 45 tablet 0   No current facility-administered medications for this visit.      PHYSICAL EXAMINATION:   Vitals:   11/15/19 1301  BP: (!) 127/41  Pulse: (!) 44  Resp: 20  Temp: (!) 93.7 F (34.3 C)   Filed Weights   11/15/19 1301  Weight: 175 lb (79.4 kg)    Physical Exam  Constitutional: He is oriented to person, place, and time and well-developed, well-nourished, and in no distress.  HENT:  Head: Normocephalic and atraumatic.  Mouth/Throat: Oropharynx is clear and moist. No oropharyngeal exudate.  Eyes: Pupils are equal, round, and reactive to light.  Cardiovascular: Normal rate and regular rhythm.  Pulmonary/Chest: No respiratory distress. He has no wheezes.  Abdominal: Soft. Bowel sounds are normal. He exhibits no distension and no mass. There is no abdominal tenderness. There is no rebound and no guarding.  Musculoskeletal:        General: No tenderness or edema. Normal range of motion.     Cervical back: Normal range of motion and neck supple.  Neurological: He is alert and  oriented to person, place, and time.  Skin: Skin is warm.  Psychiatric: Affect normal.    LABORATORY DATA:  I have reviewed the data as listed Lab Results  Component Value Date   WBC 4.1 11/15/2019   HGB 10.2 (L) 11/15/2019   HCT 31.1 (L) 11/15/2019   MCV 94.0 11/15/2019   PLT 76 (L) 11/15/2019   Recent Labs    03/15/19 1206 05/24/19 1322 08/23/19 1239 10/04/19 1343 11/15/19 1242  NA 136   < > 137 137 135  K 4.8   < > 4.6 4.8 4.6  CL 107   < > 106 106 106  CO2 23   < > _0 GLUCOSE 100*   < > 95 92 104*  BUN 26*   < > 32* 36* 34*  CREATININE 1.61*   < > 1.72* 1.77* 1.75*  CALCIUM 9.0   < > 9.1 9.2 8.8*  GFRNONAA 40*   < > 37* 36* 36*  GFRAA 47*   < > 43* 42* 42*  PROT 8.8*  --  8.3*  --   --   ALBUMIN 4.4  --  4.4  --   --   AST 19  --  18  --   --   ALT 12  --  14  --   --   ALKPHOS 55  --  56  --   --   BILITOT 0.9  --  1.1  --   --    < > = values in this interval not displayed.     No results found. Anemia of chronic kidney failure, stage 3 (moderate) #Anemia secondary to CKD/mild iron deficiency-on Retacrit; status post IV Venofer-hemoglobin today is 10.3.  Hold Retacrit today.  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  Patient is currently asymptomatic-however am concerned about further dropping platelets especially being on anticoagulation with Coumadin.  I recommend steroids prednisone 40 mg a day.  Discussed the potential side effects including but not limited to steroid myopathy thrush risk of infections especially long-term.  #History of A. fib on Coumadin-followed by cardiology; see above with regards to thrombocytopenia.   # DISPOSITION:  # HOLD retacrit/ Venofer # in 2 week- cbc- Retacrit # in 4 weeks- MD; cbc/bmp- Retacrit/d-2 venofer-  Dr.B   All questions were answered. The patient knows to call the clinic with any problems, questions or concerns.    Govinda R Brahmanday,  MD 11/15/2019 2:43 PM   

## 2019-11-15 NOTE — Assessment & Plan Note (Addendum)
#  Anemia secondary to CKD/mild iron deficiency-on Retacrit; status post IV Venofer-hemoglobin today is 10.3.  Hold Retacrit today.  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  Patient is currently asymptomatic-however am concerned about further dropping platelets especially being on anticoagulation with Coumadin.  I recommend steroids prednisone 40 mg a day.  Discussed the potential side effects including but not limited to steroid myopathy thrush risk of infections especially long-term.  #History of A. fib on Coumadin-followed by cardiology; see above with regards to thrombocytopenia.   # DISPOSITION:  # HOLD retacrit/ Venofer # in 2 week- cbc- Retacrit # in 4 weeks- MD; cbc/bmp- Retacrit/d-2 venofer-  Dr.B

## 2019-11-15 NOTE — Telephone Encounter (Signed)
New script directions rerouted to pharmacy.

## 2019-11-15 NOTE — Telephone Encounter (Signed)
Per Dr. Rogue Bussing - do not stop until directed to do so.

## 2019-11-15 NOTE — Telephone Encounter (Signed)
Pharmacy needs clarification of directions on Prednisone prescription sent in today. Until when?  Sig: Take 2 pills a day with food in AM. Do not stop until.

## 2019-11-29 ENCOUNTER — Inpatient Hospital Stay: Payer: Medicare Other

## 2019-11-29 ENCOUNTER — Other Ambulatory Visit: Payer: Self-pay

## 2019-11-29 DIAGNOSIS — N183 Chronic kidney disease, stage 3 unspecified: Secondary | ICD-10-CM | POA: Diagnosis not present

## 2019-11-29 DIAGNOSIS — D631 Anemia in chronic kidney disease: Secondary | ICD-10-CM

## 2019-11-29 LAB — CBC WITH DIFFERENTIAL/PLATELET
Abs Immature Granulocytes: 0.24 10*3/uL — ABNORMAL HIGH (ref 0.00–0.07)
Basophils Absolute: 0 10*3/uL (ref 0.0–0.1)
Basophils Relative: 0 %
Eosinophils Absolute: 0 10*3/uL (ref 0.0–0.5)
Eosinophils Relative: 0 %
HCT: 31.1 % — ABNORMAL LOW (ref 39.0–52.0)
Hemoglobin: 10.2 g/dL — ABNORMAL LOW (ref 13.0–17.0)
Immature Granulocytes: 2 %
Lymphocytes Relative: 8 %
Lymphs Abs: 1 10*3/uL (ref 0.7–4.0)
MCH: 31 pg (ref 26.0–34.0)
MCHC: 32.8 g/dL (ref 30.0–36.0)
MCV: 94.5 fL (ref 80.0–100.0)
Monocytes Absolute: 1.5 10*3/uL — ABNORMAL HIGH (ref 0.1–1.0)
Monocytes Relative: 11 %
Neutro Abs: 11 10*3/uL — ABNORMAL HIGH (ref 1.7–7.7)
Neutrophils Relative %: 79 %
Platelets: 130 10*3/uL — ABNORMAL LOW (ref 150–400)
RBC: 3.29 MIL/uL — ABNORMAL LOW (ref 4.22–5.81)
RDW: 16.8 % — ABNORMAL HIGH (ref 11.5–15.5)
WBC: 13.8 10*3/uL — ABNORMAL HIGH (ref 4.0–10.5)
nRBC: 0 % (ref 0.0–0.2)

## 2019-12-02 ENCOUNTER — Telehealth: Payer: Self-pay | Admitting: *Deleted

## 2019-12-02 NOTE — Telephone Encounter (Signed)
Patient called and states that he does not have enough Prednisone to last until his next appointment on 12/13/19. He only has enough for a few more days. Prescription states to take 2 tabs daily until advised otherwise. Please advise

## 2019-12-02 NOTE — Telephone Encounter (Signed)
Dr. B - please advise. 

## 2019-12-02 NOTE — Telephone Encounter (Signed)
Brenda-Please inform pt that his platelets are improved to 130 now- so, I would recommend cutting the prednisone. Start taking prednisone 20 mg one a day; and finish. Will re-check labs at next visit.   GB

## 2019-12-03 NOTE — Telephone Encounter (Signed)
Patient informed of doctor response, he thanked me for calling

## 2019-12-13 ENCOUNTER — Other Ambulatory Visit: Payer: Self-pay

## 2019-12-13 ENCOUNTER — Telehealth: Payer: Self-pay | Admitting: Pharmacy Technician

## 2019-12-13 ENCOUNTER — Inpatient Hospital Stay (HOSPITAL_BASED_OUTPATIENT_CLINIC_OR_DEPARTMENT_OTHER): Payer: Medicare Other | Admitting: Internal Medicine

## 2019-12-13 ENCOUNTER — Telehealth: Payer: Self-pay | Admitting: Pharmacist

## 2019-12-13 ENCOUNTER — Inpatient Hospital Stay: Payer: Medicare Other

## 2019-12-13 ENCOUNTER — Inpatient Hospital Stay: Payer: Medicare Other | Attending: Internal Medicine

## 2019-12-13 DIAGNOSIS — D631 Anemia in chronic kidney disease: Secondary | ICD-10-CM | POA: Insufficient documentation

## 2019-12-13 DIAGNOSIS — N183 Chronic kidney disease, stage 3 unspecified: Secondary | ICD-10-CM | POA: Insufficient documentation

## 2019-12-13 DIAGNOSIS — D693 Immune thrombocytopenic purpura: Secondary | ICD-10-CM

## 2019-12-13 LAB — BASIC METABOLIC PANEL
Anion gap: 10 (ref 5–15)
BUN: 48 mg/dL — ABNORMAL HIGH (ref 8–23)
CO2: 27 mmol/L (ref 22–32)
Calcium: 9.1 mg/dL (ref 8.9–10.3)
Chloride: 102 mmol/L (ref 98–111)
Creatinine, Ser: 1.77 mg/dL — ABNORMAL HIGH (ref 0.61–1.24)
GFR calc Af Amer: 42 mL/min — ABNORMAL LOW (ref >60)
GFR calc non Af Amer: 36 mL/min — ABNORMAL LOW (ref >60)
Glucose, Bld: 99 mg/dL (ref 70–99)
Potassium: 4.3 mmol/L (ref 3.5–5.1)
Sodium: 139 mmol/L (ref 135–145)

## 2019-12-13 LAB — CBC WITH DIFFERENTIAL/PLATELET
Abs Immature Granulocytes: 0.05 10*3/uL (ref 0.00–0.07)
Basophils Absolute: 0 10*3/uL (ref 0.0–0.1)
Basophils Relative: 0 %
Eosinophils Absolute: 0 10*3/uL (ref 0.0–0.5)
Eosinophils Relative: 0 %
HCT: 31.2 % — ABNORMAL LOW (ref 39.0–52.0)
Hemoglobin: 10.5 g/dL — ABNORMAL LOW (ref 13.0–17.0)
Immature Granulocytes: 1 %
Lymphocytes Relative: 12 %
Lymphs Abs: 1.2 10*3/uL (ref 0.7–4.0)
MCH: 31.6 pg (ref 26.0–34.0)
MCHC: 33.7 g/dL (ref 30.0–36.0)
MCV: 94 fL (ref 80.0–100.0)
Monocytes Absolute: 1.3 10*3/uL — ABNORMAL HIGH (ref 0.1–1.0)
Monocytes Relative: 13 %
Neutro Abs: 7.2 10*3/uL (ref 1.7–7.7)
Neutrophils Relative %: 74 %
Platelets: 59 10*3/uL — ABNORMAL LOW (ref 150–400)
RBC: 3.32 MIL/uL — ABNORMAL LOW (ref 4.22–5.81)
RDW: 17.2 % — ABNORMAL HIGH (ref 11.5–15.5)
WBC: 9.8 10*3/uL (ref 4.0–10.5)
nRBC: 0 % (ref 0.0–0.2)

## 2019-12-13 MED ORDER — PREDNISONE 20 MG PO TABS: ORAL_TABLET | ORAL | 0 refills | Status: DC

## 2019-12-13 MED ORDER — ELTROMBOPAG OLAMINE 50 MG PO TABS: 50.0000 mg | ORAL_TABLET | Freq: Every day | ORAL | 3 refills | Status: DC

## 2019-12-13 NOTE — Progress Notes (Signed)
[place] NOTE  Patient Care Team: Kirk Ruths, MD as PCP - General (Internal Medicine)  CHIEF COMPLAINTS/PURPOSE OF CONSULTATION: Anemia of chronic kidney disease/thrombocytopenia   HEMATOLOGY HISTORY  # CHRONIC ANEMIA- [at least 2018]- worsening- July 2020- 9/ nomocytic; NO Coloscopy; EGD- [place], 2017; October 2020-bone marrow biopsy-no overt myelodysplasia noted; subtle dyspoietic changes noted/karyotype normal; MDS FISH[neogenomics]- NEG panel; HOLD off NGS.   #Chronic mild thrombocytopenia [120s]-question etiology; ? 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  Patient was started on prednisone for his platelets in 60s.  Platelets improved to 130s.  Patient is currently off prednisone.  Patient notes to have worsening bruising over the last few days.  No gum bleeding or nosebleeds.  Patient notes to have improvement of his energy levels on Retacrit/IV iron..  Review of Systems  Constitutional: Positive for malaise/fatigue. Negative for chills, diaphoresis, fever and weight loss.  HENT: Negative for nosebleeds and sore throat.   Eyes: Negative for double vision.  Respiratory: Positive for shortness of breath. Negative for cough, hemoptysis, sputum production and wheezing.   Cardiovascular: Negative for chest pain, palpitations, orthopnea and leg swelling.  Gastrointestinal: Negative for abdominal pain, blood in stool, constipation, diarrhea, heartburn, melena, nausea and vomiting.  Genitourinary: Negative for dysuria, frequency and urgency.  Musculoskeletal: Negative for back pain and joint pain.  Skin:  Negative.  Negative for itching and rash.  Neurological: Negative for dizziness, tingling, focal weakness, weakness and headaches.  Endo/Heme/Allergies: Does not bruise/bleed easily.  Psychiatric/Behavioral: Negative for depression. The patient is not nervous/anxious and does not have insomnia.     MEDICAL HISTORY:  Past Medical History:  Diagnosis Date  . Atrial fibrillation ([place])   . GAD (generalized anxiety disorder)   . Hypertension   . Prediabetes     SURGICAL HISTORY: Past Surgical History:  Procedure Laterality Date  . supraorbital artery repair  2005  . TOOTH EXTRACTION      SOCIAL HISTORY: Social History   Socioeconomic History  . Marital status: Single    Spouse name: Not on file  . Number of children: Not on file  . Years of education: Not on file  . Highest education level: Not on file  Occupational History  . Not on file  Tobacco Use  . Smoking status: Former Smoker    Types: Cigarettes  Substance and Sexual Activity  . Alcohol use: Never  . Drug use: Never  . Sexual activity: Not on file  Other Topics Concern  . 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    Social Determinants of Health   Financial Resource Strain:   . Difficulty of Paying Living Expenses:   Food Insecurity:   . Worried About Charity fundraiser in the Last Year:   . Arboriculturist in the Last Year:   Transportation Needs:   . Film/video editor (Medical):   Marland Kitchen Lack of Transportation (Non-Medical):   Physical Activity:   . Days of Exercise per Week:   . Minutes of Exercise per Session:   Stress:   . Feeling of Stress :  Social Connections:   . Frequency of Communication with Friends and Family:   . Frequency of Social Gatherings with Friends and Family:   . Attends Religious Services:   . Active Member of Clubs or Organizations:   . Attends Archivist Meetings:   Marland Kitchen Marital  Status:   Intimate Partner Violence:   . Fear of Current or Ex-Partner:   . Emotionally Abused:   Marland Kitchen Physically Abused:   . Sexually Abused:     FAMILY HISTORY: Family History  Problem Relation Age of Onset  . Lung cancer Father     ALLERGIES:  is allergic to bupropion and amiodarone.  MEDICATIONS:  Current Outpatient Medications  Medication Sig Dispense Refill  . ALPRAZolam (XANAX) 0.5 MG tablet Take 0.5 mg by mouth 2 (two) times daily.     Marland Kitchen amLODipine (NORVASC) 5 MG tablet Take 5 mg by mouth daily.     . furosemide (LASIX) 20 MG tablet Take 20 mg by mouth daily.    . metoprolol tartrate (LOPRESSOR) 25 MG tablet Take 12.5 mg by mouth daily.     . sotalol (BETAPACE) 80 MG tablet Take 40 mg by mouth 2 (two) times daily.     Marland Kitchen warfarin (COUMADIN) 3 MG tablet Take as directed-Take  1 tablet by mouth  on Monday,Wednesday,Friday and 1.5 tablets by mouth all other days    . eltrombopag (PROMACTA) 50 MG tablet Take 1 tablet (50 mg total) by mouth daily. Take on an empty stomach 1 hour before a meal or 2 hours after 30 tablet 3  . predniSONE (DELTASONE) 20 MG tablet Take 2 pills a day with food in AM. Do not stop until directed to do so by provider 45 tablet 0  . sertraline (ZOLOFT) 50 MG tablet Take 50 mg by mouth daily.      No current facility-administered medications for this visit.      PHYSICAL EXAMINATION:   Vitals:   12/13/19 1445  BP: 137/79  Pulse: (!) 59  Resp: 20  Temp: 97.9 F (36.6 C)   Filed Weights   12/13/19 1429  Weight: 171 lb (77.6 kg)    Physical Exam  Constitutional: He is oriented to person, place, and time and well-developed, well-nourished, and in no distress.  HENT:  Head: Normocephalic and atraumatic.  Mouth/Throat: Oropharynx is clear and moist. No oropharyngeal exudate.  Eyes: Pupils are equal, round, and reactive to light.  Cardiovascular: Normal rate and regular rhythm.  Pulmonary/Chest: Effort normal and breath sounds normal. No  respiratory distress. He has no wheezes.  Abdominal: Soft. Bowel sounds are normal. He exhibits no distension and no mass. There is no abdominal tenderness. There is no rebound and no guarding.  Musculoskeletal:        General: No tenderness or edema. Normal range of motion.     Cervical back: Normal range of motion and neck supple.  Neurological: He is alert and oriented to person, place, and time.  Skin: Skin is warm.  Chronic bruising noted.  Bilateral upper extremities.  Psychiatric: Affect normal.    LABORATORY DATA:  I have reviewed the data as listed Lab Results  Component Value Date   WBC 9.8 12/13/2019   HGB 10.5 (L) 12/13/2019   HCT 31.2 (L) 12/13/2019   MCV 94.0 12/13/2019   PLT 59 (L) 12/13/2019   Recent Labs    03/15/19 1206 05/24/19 1322 08/23/19 1239 08/23/19 1239 10/04/19 1343 11/15/19 1242 12/13/19 1412  NA 136   < >  137   < > 137 135 139  K 4.8   < > 4.6   < > 4.8 4.6 4.3  CL 107   < > 106   < > 106 106 102  CO2 23   < > 25   < > '24 25 27  ' GLUCOSE 100*   < > 95   < > 92 104* 99  BUN 26*   < > 32*   < > 36* 34* 48*  CREATININE 1.61*   < > 1.72*   < > 1.77* 1.75* 1.77*  CALCIUM 9.0   < > 9.1   < > 9.2 8.8* 9.1  GFRNONAA 40*   < > 37*   < > 36* 36* 36*  GFRAA 47*   < > 43*   < > 42* 42* 42*  PROT 8.8*  --  8.3*  --   --   --   --   ALBUMIN 4.4  --  4.4  --   --   --   --   AST 19  --  18  --   --   --   --   ALT 12  --  14  --   --   --   --   ALKPHOS 55  --  56  --   --   --   --   BILITOT 0.9  --  1.1  --   --   --   --    < > = values in this interval not displayed.     No results found. Anemia of chronic kidney failure, stage 3 (moderate) #Anemia secondary to CKD/mild iron deficiency-on Retacrit; status post IV Venofer-hemoglobin today is 10.5.  Hold Retacrit today.  #Chronic ITP-symptomatic with increased bruising [on Coumadin]- relpased s/p prednisone-platelets are 52.  Recommend course of prednisone again.  However I would recommend starting  patient on Promacta also.  Discussed the potential side effects including but not limited to thrombosis/LFTs elevation etc.  However benefits outweigh the risk.  Discussed with pharmacy.  #History of A. fib on Coumadin-followed by cardiology; see above with regards to thrombocytopenia.  # DISPOSITION:  # HOLD retacrit # in 2 weeks- MD; cbc/bmp- Retacrit/ -  Dr.B   All questions were answered. The patient knows to call the clinic with any problems, questions or concerns.    Cammie Sickle, MD 12/16/2019 3:32 PM

## 2019-12-13 NOTE — Telephone Encounter (Signed)
Oral Oncology Pharmacist Encounter  Received new prescription for Pormacta (eltrombopag) for the treatment of chronic ITP, planned duration until disease progression or unacceptable drug toxicity.  Recommend ordering a CMP to monitor for hepatic impairment. Prescription dose and frequency assessed.   Current medication list in Epic reviewed, no DDIs with eltrombopag identified.  Prescription has been e-scribed to the [facility] for benefits analysis and approval.  Oral Oncology Clinic will continue to follow for insurance authorization, copayment issues, initial counseling and start date.  Darl Pikes, PharmD, BCPS, BCOP, CPP Hematology/Oncology Clinical Pharmacist [facility]/HP/AP Oral [place] [facility] [phone]  12/13/2019 2:58 PM

## 2019-12-13 NOTE — Assessment & Plan Note (Addendum)
#  Anemia secondary to CKD/mild iron deficiency-on Retacrit; status post IV Venofer-hemoglobin today is 10.5.  Hold Retacrit today.  #Chronic ITP-symptomatic with increased bruising [on Coumadin]- relpased s/p prednisone-platelets are 34.  Recommend course of prednisone again.  However I would recommend starting patient on Promacta also.  Discussed the potential side effects including but not limited to thrombosis/LFTs elevation etc.  However benefits outweigh the risk.  Discussed with pharmacy.  #History of A. fib on Coumadin-followed by cardiology; see above with regards to thrombocytopenia.  # DISPOSITION:  # HOLD retacrit # in 2 weeks- MD; cbc/bmp- Retacrit/ -  Dr.B

## 2019-12-13 NOTE — Telephone Encounter (Signed)
Oral Oncology Patient Advocate Encounter   After completing a benefits investigation, the patients pharmacy insurance is a discount card through his supplemental insurance.  Since Promacta would only be discounted with a very unaffordable copay, we will apply to [employer] for patient assistance.  Met patient in room to complete application for Patient Assistance Now Oncology [facility]).  [employer] will confirm insurance benefits and if patient qualifies his application will be forwarded to [employer] Patient [employer].   Application completed and faxed to [phone].   Patient Assistance Now Oncology phone number for follow up is [phone].   This encounter will be updated until final determination.   [place] [facility] [place] Phone [phone] Fax [phone] 12/14/2019 9:25 AM

## 2019-12-14 ENCOUNTER — Inpatient Hospital Stay: Payer: Medicare Other

## 2019-12-14 VITALS — BP 115/63 | HR 53 | Temp 97.7°F | Resp 18

## 2019-12-14 DIAGNOSIS — D631 Anemia in chronic kidney disease: Secondary | ICD-10-CM

## 2019-12-14 DIAGNOSIS — N183 Chronic kidney disease, stage 3 unspecified: Secondary | ICD-10-CM | POA: Diagnosis not present

## 2019-12-14 MED ORDER — SODIUM CHLORIDE 0.9 % IV SOLN
510.0000 mg | Freq: Once | INTRAVENOUS | Status: AC
  Administered 2019-12-14: 14:00:00 510 mg via INTRAVENOUS
  Filled 2019-12-14: qty 17

## 2019-12-14 MED ORDER — ACETAMINOPHEN 325 MG PO TABS
650.0000 mg | ORAL_TABLET | Freq: Once | ORAL | Status: AC
  Administered 2019-12-14: 650 mg via ORAL
  Filled 2019-12-14: qty 2

## 2019-12-14 MED ORDER — SODIUM CHLORIDE 0.9 % IV SOLN
Freq: Once | INTRAVENOUS | Status: AC
  Filled 2019-12-14: qty 250

## 2019-12-16 NOTE — Telephone Encounter (Signed)
Oral Oncology Patient Advocate Encounter  Received notification from [facility] that the patient has been referred to the [employer] Patient [place].  Patient has been successfully enrolled into their program to receive Promacta from the manufacturer at $0 out of pocket until 09/01/2020.    I called and spoke with patient.  He knows we will have to re-apply.   Specialty Pharmacy that will dispense medication is [facility] by [employer] [facility].  Patient knows to call the office with questions or concerns.   Oral Oncology Clinic will continue to follow.  [place] [facility] [place] Phone [phone] Fax [phone] 12/16/2019 9:28 AM

## 2019-12-27 ENCOUNTER — Inpatient Hospital Stay: Payer: Medicare Other

## 2019-12-27 ENCOUNTER — Other Ambulatory Visit: Payer: Self-pay

## 2019-12-27 ENCOUNTER — Inpatient Hospital Stay (HOSPITAL_BASED_OUTPATIENT_CLINIC_OR_DEPARTMENT_OTHER): Payer: Medicare Other | Admitting: Internal Medicine

## 2019-12-27 DIAGNOSIS — N183 Chronic kidney disease, stage 3 unspecified: Secondary | ICD-10-CM

## 2019-12-27 DIAGNOSIS — D631 Anemia in chronic kidney disease: Secondary | ICD-10-CM | POA: Diagnosis not present

## 2019-12-27 LAB — CBC WITH DIFFERENTIAL/PLATELET
Abs Immature Granulocytes: 0.32 10*3/uL — ABNORMAL HIGH (ref 0.00–0.07)
Basophils Absolute: 0 10*3/uL (ref 0.0–0.1)
Basophils Relative: 0 %
Eosinophils Absolute: 0 10*3/uL (ref 0.0–0.5)
Eosinophils Relative: 0 %
HCT: 31.1 % — ABNORMAL LOW (ref 39.0–52.0)
Hemoglobin: 10.5 g/dL — ABNORMAL LOW (ref 13.0–17.0)
Immature Granulocytes: 2 %
Lymphocytes Relative: 6 %
Lymphs Abs: 0.8 10*3/uL (ref 0.7–4.0)
MCH: 31.3 pg (ref 26.0–34.0)
MCHC: 33.8 g/dL (ref 30.0–36.0)
MCV: 92.8 fL (ref 80.0–100.0)
Monocytes Absolute: 0.8 10*3/uL (ref 0.1–1.0)
Monocytes Relative: 6 %
Neutro Abs: 12.4 10*3/uL — ABNORMAL HIGH (ref 1.7–7.7)
Neutrophils Relative %: 86 %
Platelets: 94 10*3/uL — ABNORMAL LOW (ref 150–400)
RBC: 3.35 MIL/uL — ABNORMAL LOW (ref 4.22–5.81)
RDW: 17.2 % — ABNORMAL HIGH (ref 11.5–15.5)
WBC: 14.4 10*3/uL — ABNORMAL HIGH (ref 4.0–10.5)
nRBC: 0 % (ref 0.0–0.2)

## 2019-12-27 LAB — BASIC METABOLIC PANEL
Anion gap: 11 (ref 5–15)
BUN: 51 mg/dL — ABNORMAL HIGH (ref 8–23)
CO2: 25 mmol/L (ref 22–32)
Calcium: 8.9 mg/dL (ref 8.9–10.3)
Chloride: 99 mmol/L (ref 98–111)
Creatinine, Ser: 1.61 mg/dL — ABNORMAL HIGH (ref 0.61–1.24)
GFR calc Af Amer: 47 mL/min — ABNORMAL LOW (ref >60)
GFR calc non Af Amer: 40 mL/min — ABNORMAL LOW (ref >60)
Glucose, Bld: 130 mg/dL — ABNORMAL HIGH (ref 70–99)
Potassium: 4.6 mmol/L (ref 3.5–5.1)
Sodium: 135 mmol/L (ref 135–145)

## 2019-12-27 NOTE — Progress Notes (Signed)
[place] NOTE  Patient Care Team: Kirk Ruths, MD as PCP - General (Internal Medicine)  CHIEF COMPLAINTS/PURPOSE OF CONSULTATION: Anemia of chronic kidney disease/thrombocytopenia   HEMATOLOGY HISTORY  # CHRONIC ANEMIA- [at least 2018]- worsening- July 2020- 9/ nomocytic; NO Coloscopy; EGD- [place], 2017; October 2020-bone marrow biopsy-no overt myelodysplasia noted; subtle dyspoietic changes noted/karyotype normal; MDS FISH[[facility]]- NEG panel; HOLD off NGS.   #Chronic mild thrombocytopenia [120s]-question etiology; ? 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  Patient is currently back on prednisone 40 mg a day as his platelets dropped when taken off steroids.  Patient received his Promacta medication.  Patient continues on Coumadin.  Denies any nosebleeds or gum bleeds.  Notes improvement of energy levels on Retacrit/IV iron.   Review of Systems  Constitutional: Positive for malaise/fatigue. Negative for chills, diaphoresis, fever and weight loss.  HENT: Negative for nosebleeds and sore throat.   Eyes: Negative for double vision.  Respiratory: Positive for shortness of breath. Negative for cough, hemoptysis, sputum production and wheezing.   Cardiovascular: Negative for chest pain, palpitations, orthopnea and leg swelling.  Gastrointestinal: Negative for abdominal pain, blood in stool, constipation, diarrhea, heartburn, melena, nausea and vomiting.  Genitourinary: Negative for dysuria, frequency and urgency.  Musculoskeletal: Negative for back pain and joint pain.  Skin: Negative.  Negative for itching and  rash.  Neurological: Negative for dizziness, tingling, focal weakness, weakness and headaches.  Endo/Heme/Allergies: Does not bruise/bleed easily.  Psychiatric/Behavioral: Negative for depression. The patient is not nervous/anxious and does not have insomnia.     MEDICAL HISTORY:  Past Medical History:  Diagnosis Date  . Atrial fibrillation ([place])   . GAD (generalized anxiety disorder)   . Hypertension   . Prediabetes     SURGICAL HISTORY: Past Surgical History:  Procedure Laterality Date  . supraorbital artery repair  2005  . TOOTH EXTRACTION      SOCIAL HISTORY: Social History   Socioeconomic History  . Marital status: Single    Spouse name: Not on file  . Number of children: Not on file  . Years of education: Not on file  . Highest education level: Not on file  Occupational History  . Not on file  Tobacco Use  . Smoking status: Former Smoker    Types: Cigarettes  Substance and Sexual Activity  . Alcohol use: Never  . Drug use: Never  . Sexual activity: Not on file  Other Topics Concern  . 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    Social Determinants of Health   Financial Resource Strain:   . Difficulty of Paying Living Expenses:   Food Insecurity:   . Worried About Charity fundraiser in the Last Year:   . Arboriculturist in the Last Year:   Transportation Needs:   . Film/video editor (Medical):   Marland Kitchen Lack of Transportation (Non-Medical):   Physical Activity:   . Days of Exercise per Week:   . Minutes of Exercise per Session:   Stress:   . Feeling of Stress :   Social Connections:   .  Frequency of Communication with Friends and Family:   . Frequency of Social Gatherings with Friends and Family:   . Attends Religious Services:   . Active Member of Clubs or Organizations:   . Attends Archivist Meetings:   Marland Kitchen Marital Status:   Intimate Partner Violence:    . Fear of Current or Ex-Partner:   . Emotionally Abused:   Marland Kitchen Physically Abused:   . Sexually Abused:     FAMILY HISTORY: Family History  Problem Relation Age of Onset  . Lung cancer Father     ALLERGIES:  is allergic to bupropion and amiodarone.  MEDICATIONS:  Current Outpatient Medications  Medication Sig Dispense Refill  . ALPRAZolam (XANAX) 0.5 MG tablet Take 0.5 mg by mouth 2 (two) times daily.     Marland Kitchen amLODipine (NORVASC) 5 MG tablet Take 5 mg by mouth daily.     Marland Kitchen eltrombopag (PROMACTA) 50 MG tablet Take 1 tablet (50 mg total) by mouth daily. Take on an empty stomach 1 hour before a meal or 2 hours after 30 tablet 3  . furosemide (LASIX) 20 MG tablet Take 20 mg by mouth daily.    . metoprolol tartrate (LOPRESSOR) 25 MG tablet Take 12.5 mg by mouth daily.     . predniSONE (DELTASONE) 20 MG tablet Take 2 pills a day with food in AM. Do not stop until directed to do so by provider 45 tablet 0  . sertraline (ZOLOFT) 50 MG tablet Take 50 mg by mouth daily.     . sotalol (BETAPACE) 80 MG tablet Take 40 mg by mouth 2 (two) times daily.     Marland Kitchen warfarin (COUMADIN) 3 MG tablet Take as directed-Take  1 tablet by mouth  on Monday,Wednesday,Friday and 1.5 tablets by mouth all other days     No current facility-administered medications for this visit.      PHYSICAL EXAMINATION:   Vitals:   12/27/19 1444  BP: 130/65  Pulse: 65  Resp: 18  Temp: 97.6 F (36.4 C)   Filed Weights   12/27/19 1444  Weight: 173 lb 1.6 oz (78.5 kg)    Physical Exam  Constitutional: He is oriented to person, place, and time and well-developed, well-nourished, and in no distress.  HENT:  Head: Normocephalic and atraumatic.  Mouth/Throat: Oropharynx is clear and moist. No oropharyngeal exudate.  Eyes: Pupils are equal, round, and reactive to light.  Cardiovascular: Normal rate and regular rhythm.  Pulmonary/Chest: Effort normal and breath sounds normal. No respiratory distress. He has no wheezes.   Abdominal: Soft. Bowel sounds are normal. He exhibits no distension and no mass. There is no abdominal tenderness. There is no rebound and no guarding.  Musculoskeletal:        General: No tenderness or edema. Normal range of motion.     Cervical back: Normal range of motion and neck supple.  Neurological: He is alert and oriented to person, place, and time.  Skin: Skin is warm.  Chronic bruising noted.  Bilateral upper extremities.  Psychiatric: Affect normal.    LABORATORY DATA:  I have reviewed the data as listed Lab Results  Component Value Date   WBC 14.4 (H) 12/27/2019   HGB 10.5 (L) 12/27/2019   HCT 31.1 (L) 12/27/2019   MCV 92.8 12/27/2019   PLT 94 (L) 12/27/2019   Recent Labs    03/15/19 1206 05/24/19 1322 08/23/19 1239 10/04/19 1343 11/15/19 1242 12/13/19 1412 12/27/19 1425  NA 136   < > 137   < >  135 139 135  K 4.8   < > 4.6   < > 4.6 4.3 4.6  CL 107   < > 106   < > 106 102 99  CO2 23   < > 25   < > _0 GLUCOSE 100*   < > 95   < > 104* 99 130*  BUN 26*   < > 32*   < > 34* 48* 51*  CREATININE 1.61*   < > 1.72*   < > 1.75* 1.77* 1.61*  CALCIUM 9.0   < > 9.1   < > 8.8* 9.1 8.9  GFRNONAA 40*   < > 37*   < > 36* 36* 40*  GFRAA 47*   < > 43*   < > 42* 42* 47*  PROT 8.8*  --  8.3*  --   --   --   --   ALBUMIN 4.4  --  4.4  --   --   --   --   AST 19  --  18  --   --   --   --   ALT 12  --  14  --   --   --   --   ALKPHOS 55  --  56  --   --   --   --   BILITOT 0.9  --  1.1  --   --   --   --    < > = values in this interval not displayed.     No results found. Anemia of chronic kidney failure, stage 3 (moderate) #Anemia secondary to CKD/mild iron deficiency-on Retacrit; status post IV Venofer-hemoglobin today is 10.5.  Hold Retacrit today.  #Chronic ITP-symptomatic with increased bruising [on Coumadin]-on prendisone 40 mg/day.  Recommend tapering to 20 mg a day over the next 1 week; add Promacta 37m once a day.  Reviewed the potential side effects  including but not limited to risk of blood clots; also risk of elevated abnormalities.  Low risk of any bone marrow fibrosis.  #History of A. fib on Coumadin-followed by cardiology; see above with regards to thrombocytopenia.  Stable.  # DISPOSITION:  # HOLD RETACRIT # in 2 weeks-cbc/Retacrit # in 4 weeks- cbc/possible retacrit/  -  Dr.B   All questions were answered. The patient knows to call the clinic with any problems, questions or concerns.    GCammie Sickle MD 01/02/2020 10:48 PM

## 2019-12-27 NOTE — Assessment & Plan Note (Addendum)
#  Anemia secondary to CKD/mild iron deficiency-on Retacrit; status post IV Venofer-hemoglobin today is 10.5.  Hold Retacrit today.  #Chronic ITP-symptomatic with increased bruising [on Coumadin]-on prendisone 40 mg/day.  Recommend tapering to 20 mg a day over the next 1 week; add Promacta 50mg  once a day.  Reviewed the potential side effects including but not limited to risk of blood clots; also risk of elevated abnormalities.  Low risk of any bone marrow fibrosis.  #History of A. fib on Coumadin-followed by cardiology; see above with regards to thrombocytopenia.  Stable.  # DISPOSITION:  # HOLD RETACRIT # in 2 weeks-cbc/Retacrit # in 4 weeks- cbc/possible retacrit/  -  Dr.B

## 2020-01-10 ENCOUNTER — Other Ambulatory Visit: Payer: Self-pay

## 2020-01-10 ENCOUNTER — Inpatient Hospital Stay: Payer: Medicare Other | Attending: Internal Medicine

## 2020-01-10 ENCOUNTER — Inpatient Hospital Stay: Payer: Medicare Other

## 2020-01-10 VITALS — BP 90/56 | HR 87

## 2020-01-10 DIAGNOSIS — D631 Anemia in chronic kidney disease: Secondary | ICD-10-CM

## 2020-01-10 DIAGNOSIS — N183 Chronic kidney disease, stage 3 unspecified: Secondary | ICD-10-CM | POA: Insufficient documentation

## 2020-01-10 LAB — CBC WITH DIFFERENTIAL/PLATELET
Abs Immature Granulocytes: 0.07 10*3/uL (ref 0.00–0.07)
Basophils Absolute: 0 10*3/uL (ref 0.0–0.1)
Basophils Relative: 0 %
Eosinophils Absolute: 0.1 10*3/uL (ref 0.0–0.5)
Eosinophils Relative: 1 %
HCT: 29 % — ABNORMAL LOW (ref 39.0–52.0)
Hemoglobin: 9.8 g/dL — ABNORMAL LOW (ref 13.0–17.0)
Immature Granulocytes: 1 %
Lymphocytes Relative: 9 %
Lymphs Abs: 0.8 10*3/uL (ref 0.7–4.0)
MCH: 32.1 pg (ref 26.0–34.0)
MCHC: 33.8 g/dL (ref 30.0–36.0)
MCV: 95.1 fL (ref 80.0–100.0)
Monocytes Absolute: 1.1 10*3/uL — ABNORMAL HIGH (ref 0.1–1.0)
Monocytes Relative: 13 %
Neutro Abs: 6.9 10*3/uL (ref 1.7–7.7)
Neutrophils Relative %: 76 %
Platelets: 83 10*3/uL — ABNORMAL LOW (ref 150–400)
RBC: 3.05 MIL/uL — ABNORMAL LOW (ref 4.22–5.81)
RDW: 18.6 % — ABNORMAL HIGH (ref 11.5–15.5)
WBC: 9 10*3/uL (ref 4.0–10.5)
nRBC: 0 % (ref 0.0–0.2)

## 2020-01-10 MED ORDER — EPOETIN ALFA-EPBX 10000 UNIT/ML IJ SOLN
20000.0000 [IU] | Freq: Once | INTRAMUSCULAR | Status: AC
  Administered 2020-01-10: 20000 [IU] via SUBCUTANEOUS
  Filled 2020-01-10: qty 2

## 2020-01-24 ENCOUNTER — Inpatient Hospital Stay: Payer: Medicare Other

## 2020-01-24 ENCOUNTER — Other Ambulatory Visit: Payer: Self-pay

## 2020-01-24 VITALS — BP 102/66

## 2020-01-24 DIAGNOSIS — N183 Chronic kidney disease, stage 3 unspecified: Secondary | ICD-10-CM

## 2020-01-24 DIAGNOSIS — D631 Anemia in chronic kidney disease: Secondary | ICD-10-CM

## 2020-01-24 LAB — CBC WITH DIFFERENTIAL/PLATELET
Abs Immature Granulocytes: 0.09 10*3/uL — ABNORMAL HIGH (ref 0.00–0.07)
Basophils Absolute: 0.1 10*3/uL (ref 0.0–0.1)
Basophils Relative: 1 %
Eosinophils Absolute: 0.1 10*3/uL (ref 0.0–0.5)
Eosinophils Relative: 1 %
HCT: 29 % — ABNORMAL LOW (ref 39.0–52.0)
Hemoglobin: 9.4 g/dL — ABNORMAL LOW (ref 13.0–17.0)
Immature Granulocytes: 1 %
Lymphocytes Relative: 17 %
Lymphs Abs: 1.6 10*3/uL (ref 0.7–4.0)
MCH: 31.4 pg (ref 26.0–34.0)
MCHC: 32.4 g/dL (ref 30.0–36.0)
MCV: 97 fL (ref 80.0–100.0)
Monocytes Absolute: 1.4 10*3/uL — ABNORMAL HIGH (ref 0.1–1.0)
Monocytes Relative: 15 %
Neutro Abs: 6 10*3/uL (ref 1.7–7.7)
Neutrophils Relative %: 65 %
Platelets: 290 10*3/uL (ref 150–400)
RBC: 2.99 MIL/uL — ABNORMAL LOW (ref 4.22–5.81)
RDW: 18.5 % — ABNORMAL HIGH (ref 11.5–15.5)
WBC: 9.2 10*3/uL (ref 4.0–10.5)
nRBC: 0 % (ref 0.0–0.2)

## 2020-01-24 MED ORDER — EPOETIN ALFA-EPBX 10000 UNIT/ML IJ SOLN
20000.0000 [IU] | Freq: Once | INTRAMUSCULAR | Status: AC
  Administered 2020-01-24: 20000 [IU] via SUBCUTANEOUS
  Filled 2020-01-24: qty 2

## 2020-02-18 ENCOUNTER — Telehealth: Payer: Self-pay | Admitting: *Deleted

## 2020-02-18 DIAGNOSIS — D631 Anemia in chronic kidney disease: Secondary | ICD-10-CM

## 2020-02-18 DIAGNOSIS — D693 Immune thrombocytopenic purpura: Secondary | ICD-10-CM

## 2020-02-18 NOTE — Telephone Encounter (Signed)
Call regarding the fact that patient has not had any appointments since May and he does not have any scheduled. On 4/26 check out the appointment with Dr B did not get scheduled Follow-up and Disposition History  12/27/2019 1459 - Cammie Sickle, MD  Check-out note:  # DISPOSITION:  # HOLD RETACRIT  # in 2 weeks-cbc/possible Retacrit  # in 4 weeks- cbc/possible retacrit/ - Dr.B   Please advise of when this patient should return and call with an appointment.

## 2020-02-18 NOTE — Telephone Encounter (Signed)
Dr. Jacinto Reap - please advise on the labs that need to be drawn next week.

## 2020-02-18 NOTE — Telephone Encounter (Signed)
Reviewed chart - Patient did come for lab/possible retacrit on 5/10 and 5/24.  Colette, please contact patient with apts next week. Lab/md/retacrit.

## 2020-02-18 NOTE — Telephone Encounter (Signed)
Pt has an apt on Tuesday.

## 2020-02-18 NOTE — Telephone Encounter (Signed)
Spoke with Dr. Jacinto Reap- will order a cbc/metb at next week

## 2020-02-22 ENCOUNTER — Other Ambulatory Visit: Payer: Self-pay

## 2020-02-22 ENCOUNTER — Inpatient Hospital Stay (HOSPITAL_BASED_OUTPATIENT_CLINIC_OR_DEPARTMENT_OTHER): Payer: Medicare Other | Admitting: Internal Medicine

## 2020-02-22 ENCOUNTER — Inpatient Hospital Stay: Payer: Medicare Other | Attending: Internal Medicine

## 2020-02-22 ENCOUNTER — Other Ambulatory Visit
Admission: RE | Admit: 2020-02-22 | Discharge: 2020-02-22 | Disposition: A | Discharge status: Home / Self Care | Payer: Medicare Other | Source: Ambulatory Visit | Attending: Cardiology | Admitting: Cardiology

## 2020-02-22 ENCOUNTER — Inpatient Hospital Stay: Payer: Medicare Other

## 2020-02-22 DIAGNOSIS — Z87891 Personal history of nicotine dependence: Secondary | ICD-10-CM | POA: Diagnosis not present

## 2020-02-22 DIAGNOSIS — D693 Immune thrombocytopenic purpura: Secondary | ICD-10-CM | POA: Diagnosis not present

## 2020-02-22 DIAGNOSIS — N183 Chronic kidney disease, stage 3 unspecified: Secondary | ICD-10-CM | POA: Insufficient documentation

## 2020-02-22 DIAGNOSIS — Z20822 Contact with and (suspected) exposure to covid-19: Secondary | ICD-10-CM | POA: Insufficient documentation

## 2020-02-22 DIAGNOSIS — D631 Anemia in chronic kidney disease: Secondary | ICD-10-CM | POA: Insufficient documentation

## 2020-02-22 DIAGNOSIS — I4891 Unspecified atrial fibrillation: Secondary | ICD-10-CM | POA: Insufficient documentation

## 2020-02-22 DIAGNOSIS — Z7901 Long term (current) use of anticoagulants: Secondary | ICD-10-CM | POA: Diagnosis not present

## 2020-02-22 DIAGNOSIS — Z01812 Encounter for preprocedural laboratory examination: Secondary | ICD-10-CM | POA: Diagnosis present

## 2020-02-22 LAB — CBC WITH DIFFERENTIAL/PLATELET
Abs Immature Granulocytes: 0.11 10*3/uL — ABNORMAL HIGH (ref 0.00–0.07)
Basophils Absolute: 0.1 10*3/uL (ref 0.0–0.1)
Basophils Relative: 1 %
Eosinophils Absolute: 0.2 10*3/uL (ref 0.0–0.5)
Eosinophils Relative: 3 %
HCT: 34 % — ABNORMAL LOW (ref 39.0–52.0)
Hemoglobin: 11 g/dL — ABNORMAL LOW (ref 13.0–17.0)
Immature Granulocytes: 1 %
Lymphocytes Relative: 16 %
Lymphs Abs: 1.3 10*3/uL (ref 0.7–4.0)
MCH: 31.1 pg (ref 26.0–34.0)
MCHC: 32.4 g/dL (ref 30.0–36.0)
MCV: 96 fL (ref 80.0–100.0)
Monocytes Absolute: 1.1 10*3/uL — ABNORMAL HIGH (ref 0.1–1.0)
Monocytes Relative: 13 %
Neutro Abs: 5.3 10*3/uL (ref 1.7–7.7)
Neutrophils Relative %: 66 %
Platelets: 281 10*3/uL (ref 150–400)
RBC: 3.54 MIL/uL — ABNORMAL LOW (ref 4.22–5.81)
RDW: 17.4 % — ABNORMAL HIGH (ref 11.5–15.5)
WBC: 8.2 10*3/uL (ref 4.0–10.5)
nRBC: 0 % (ref 0.0–0.2)

## 2020-02-22 LAB — BASIC METABOLIC PANEL
Anion gap: 10 (ref 5–15)
BUN: 27 mg/dL — ABNORMAL HIGH (ref 8–23)
CO2: 28 mmol/L (ref 22–32)
Calcium: 9 mg/dL (ref 8.9–10.3)
Chloride: 102 mmol/L (ref 98–111)
Creatinine, Ser: 2.35 mg/dL — ABNORMAL HIGH (ref 0.61–1.24)
GFR calc Af Amer: 30 mL/min — ABNORMAL LOW (ref >60)
GFR calc non Af Amer: 26 mL/min — ABNORMAL LOW (ref >60)
Glucose, Bld: 124 mg/dL — ABNORMAL HIGH (ref 70–99)
Potassium: 4.1 mmol/L (ref 3.5–5.1)
Sodium: 140 mmol/L (ref 135–145)

## 2020-02-22 LAB — SARS CORONAVIRUS 2 (TAT 6-24 HRS): SARS Coronavirus 2: NEGATIVE

## 2020-02-22 NOTE — Progress Notes (Signed)
[place] NOTE  Patient Care Team: Kirk Ruths, MD as PCP - General (Internal Medicine) Cammie Sickle, MD as Consulting Physician (Internal Medicine)  CHIEF COMPLAINTS/PURPOSE OF CONSULTATION: Anemia of chronic kidney disease/thrombocytopenia   HEMATOLOGY HISTORY  # CHRONIC ANEMIA- [at least 2018]- worsening- July 2020- 9/ nomocytic; NO Coloscopy; EGD- [place], 2017; October 2020-bone marrow biopsy-no overt myelodysplasia noted; subtle dyspoietic changes noted/karyotype normal; MDS FISH[[facility]]- NEG panel; HOLD off NGS.   #Chronic mild thrombocytopenia [120s]-question etiology; ? 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  Patient has been on Promacta for the last 6 weeks or so.  Denies any bone pain or joint pains.  Appetite is fair.  Denies any back pain.  Any fevers or chills.  No nausea no vomiting.  Notes improvement of fatigue on Retacrit/iron.  However shortness of breath fatigue not completely resolved.  Review of Systems  Constitutional: Positive for malaise/fatigue. Negative for chills, diaphoresis, fever and weight loss.  HENT: Negative for nosebleeds and sore throat.   Eyes: Negative for double vision.  Respiratory: Positive for shortness of breath. Negative for cough, hemoptysis, sputum production and wheezing.   Cardiovascular: Negative for chest pain, palpitations, orthopnea and leg swelling.  Gastrointestinal: Negative for abdominal pain, blood in stool, constipation, diarrhea, heartburn, melena, nausea and vomiting.  Genitourinary: Negative for dysuria, frequency and  urgency.  Musculoskeletal: Negative for back pain and joint pain.  Skin: Negative.  Negative for itching and rash.  Neurological: Negative for dizziness, tingling, focal weakness, weakness and headaches.  Endo/Heme/Allergies: Does not bruise/bleed easily.  Psychiatric/Behavioral: Negative for depression. The patient is not nervous/anxious and does not have insomnia.     MEDICAL HISTORY:  Past Medical History:  Diagnosis Date  . Atrial fibrillation ([place])   . GAD (generalized anxiety disorder)   . Hypertension   . Prediabetes     SURGICAL HISTORY: Past Surgical History:  Procedure Laterality Date  . supraorbital artery repair  2005  . TOOTH EXTRACTION      SOCIAL HISTORY: Social History   Socioeconomic History  . Marital status: Single    Spouse name: Not on file  . Number of children: Not on file  . Years of education: Not on file  . Highest education level: Not on file  Occupational History  . Not on file  Tobacco Use  . Smoking status: Former Smoker    Types: Nurse, learning disability  . Vaping Use: Former  . Quit date: 03/14/2005  Substance and Sexual Activity  . Alcohol use: Never  . Drug use: Never  . Sexual activity: Not on file  Other Topics Concern  . 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    Social Determinants of Health   Financial Resource Strain:   . Difficulty of Paying Living Expenses:   Food Insecurity:   . Worried About Charity fundraiser in the Last Year:   . Arboriculturist in the Last Year:   Transportation Needs:   . Film/video editor (Medical):   Marland Kitchen Lack of Transportation (Non-Medical):   Physical  Activity:   . Days of Exercise per Week:   . Minutes of Exercise per Session:   Stress:   . Feeling of Stress :   Social Connections:   . Frequency of Communication with Friends and Family:   . Frequency of Social Gatherings with Friends and Family:   .  Attends Religious Services:   . Active Member of Clubs or Organizations:   . Attends Archivist Meetings:   Marland Kitchen Marital Status:   Intimate Partner Violence:   . Fear of Current or Ex-Partner:   . Emotionally Abused:   Marland Kitchen Physically Abused:   . Sexually Abused:     FAMILY HISTORY: Family History  Problem Relation Age of Onset  . Lung cancer Father     ALLERGIES:  is allergic to bupropion and amiodarone.  MEDICATIONS:  Current Outpatient Medications  Medication Sig Dispense Refill  . ALPRAZolam (XANAX) 0.5 MG tablet Take 0.5 mg by mouth 2 (two) times daily as needed for anxiety.     Marland Kitchen diltiazem (TIAZAC) 180 MG 24 hr capsule Take 180 mg by mouth daily.    Marland Kitchen eltrombopag (PROMACTA) 50 MG tablet Take 1 tablet (50 mg total) by mouth daily. Take on an empty stomach 1 hour before a meal or 2 hours after 30 tablet 3  . metoprolol tartrate (LOPRESSOR) 50 MG tablet Take 50 mg by mouth 2 (two) times daily.    Marland Kitchen warfarin (COUMADIN) 3 MG tablet Take 3-4.5 mg by mouth See admin instructions. Take 3 mg on Mon, Wed, Fri, and Sat. Take 4.5 mg on Tues, Thurs, and Sun.    Marland Kitchen aspirin 325 MG tablet Take 325 mg by mouth daily as needed for moderate pain. (Patient not taking: Reported on 02/22/2020)    . furosemide (LASIX) 20 MG tablet Take 20 mg by mouth daily.    . sertraline (ZOLOFT) 50 MG tablet Take 50 mg by mouth daily as needed (anxiety/depression).      No current facility-administered medications for this visit.      PHYSICAL EXAMINATION:   Vitals:   02/22/20 1510  BP: 107/66  Pulse: 95  Temp: (!) 96.4 F (35.8 C)   Filed Weights   02/22/20 1510  Weight: 173 lb (78.5 kg)    Physical Exam HENT:     Head: Normocephalic and atraumatic.     Mouth/Throat:     Pharynx: No oropharyngeal exudate.  Eyes:     Pupils: Pupils are equal, round, and reactive to light.  Cardiovascular:     Rate and Rhythm: Normal rate and regular rhythm.  Pulmonary:     Effort: Pulmonary effort is  normal. No respiratory distress.     Breath sounds: Normal breath sounds. No wheezing.  Abdominal:     General: Bowel sounds are normal. There is no distension.     Palpations: Abdomen is soft. There is no mass.     Tenderness: There is no abdominal tenderness. There is no guarding or rebound.  Musculoskeletal:        General: No tenderness. Normal range of motion.     Cervical back: Normal range of motion and neck supple.  Skin:    General: Skin is warm.     Comments: Chronic bruising noted.  Bilateral upper extremities.  Neurological:     Mental Status: He is alert and oriented to person, place, and time.  Psychiatric:        Mood and Affect: Affect normal.     LABORATORY DATA:  I have reviewed the data as listed Lab Results  Component Value Date   WBC 8.2 02/22/2020   HGB 11.0 (L) 02/22/2020   HCT 34.0 (L) 02/22/2020   MCV 96.0 02/22/2020   PLT 281 02/22/2020   Recent Labs    03/15/19 1206 05/24/19 1322 08/23/19 1239 10/04/19 1343 12/13/19 1412 12/27/19 1425 02/22/20 1501  NA 136   < > 137   < > 139 135 140  K 4.8   < > 4.6   < > 4.3 4.6 4.1  CL 107   < > 106   < > 102 99 102  CO2 23   < > 25   < > _0 GLUCOSE 100*   < > 95   < > 99 130* 124*  BUN 26*   < > 32*   < > 48* 51* 27*  CREATININE 1.61*   < > 1.72*   < > 1.77* 1.61* 2.35*  CALCIUM 9.0   < > 9.1   < > 9.1 8.9 9.0  GFRNONAA 40*   < > 37*   < > 36* 40* 26*  GFRAA 47*   < > 43*   < > 42* 47* 30*  PROT 8.8*  --  8.3*  --   --   --   --   ALBUMIN 4.4  --  4.4  --   --   --   --   AST 19  --  18  --   --   --   --   ALT 12  --  14  --   --   --   --   ALKPHOS 55  --  56  --   --   --   --   BILITOT 0.9  --  1.1  --   --   --   --    < > = values in this interval not displayed.     No results found. Anemia of chronic kidney failure, stage 3 (moderate) #Anemia secondary to CKD/mild iron deficiency-on Retacrit; status post IV Venofer-hemoglobin today is 11. Hold Retacrit today.  #Chronic ITP- [on  Coumadin]-currently on Promacta 52m once a day; platelets- 280; recommend taking promatca 50 mg every other day.   # History of A. fib on Coumadin; followed by cardiology; awaiting cardioversion this week.    # CKD stage-III; worse GFR today 26. Recommend evaluation with nephrology; defer to PCP; patient has appointment with PCP in the next couple of weeks.  Recommend increase hydration.  # DISPOSITION:  # HOLD RETACRIT # in 2 weeks-cbc/Retacrit # in 6 weeks- cbc/possible retacrit/  -  Dr.B   All questions were answered. The patient knows to call the clinic with any problems, questions or concerns.    GCammie Sickle MD 02/22/2020 4:11 PM

## 2020-02-22 NOTE — Assessment & Plan Note (Signed)
#  Anemia secondary to CKD/mild iron deficiency-on Retacrit; status post IV Venofer-hemoglobin today is 11. Hold Retacrit today.  #Chronic ITP- [on Coumadin]-currently on Promacta 50mg  once a day; platelets- 280; recommend taking promatca 50 mg every other day.   # History of A. fib on Coumadin; followed by cardiology; awaiting cardioversion this week.    # CKD stage-III; worse GFR today 26. Recommend evaluation with nephrology; defer to PCP; patient has appointment with PCP in the next couple of weeks.  Recommend increase hydration.  # DISPOSITION:  # HOLD RETACRIT # in 2 weeks-cbc/Retacrit # in 6 weeks- cbc/possible retacrit/  -  Dr.B

## 2020-02-22 NOTE — Patient Instructions (Signed)
#   take promacta 50 mg one every other day.

## 2020-02-24 ENCOUNTER — Other Ambulatory Visit: Payer: Self-pay

## 2020-02-24 ENCOUNTER — Ambulatory Visit: Payer: Medicare Other | Admitting: Certified Registered Nurse Anesthetist

## 2020-02-24 ENCOUNTER — Ambulatory Visit
Admission: RE | Admit: 2020-02-24 | Discharge: 2020-02-24 | Disposition: A | Discharge status: Home / Self Care | Payer: Medicare Other | Attending: Cardiology | Admitting: Cardiology

## 2020-02-24 ENCOUNTER — Encounter
Admission: RE | Disposition: A | Discharge status: Home / Self Care | Payer: Self-pay | Source: Home / Self Care | Attending: Cardiology

## 2020-02-24 ENCOUNTER — Encounter: Payer: Self-pay | Admitting: Cardiology

## 2020-02-24 DIAGNOSIS — I10 Essential (primary) hypertension: Secondary | ICD-10-CM | POA: Insufficient documentation

## 2020-02-24 DIAGNOSIS — Z7901 Long term (current) use of anticoagulants: Secondary | ICD-10-CM | POA: Insufficient documentation

## 2020-02-24 DIAGNOSIS — Z79899 Other long term (current) drug therapy: Secondary | ICD-10-CM | POA: Diagnosis not present

## 2020-02-24 DIAGNOSIS — I2510 Atherosclerotic heart disease of native coronary artery without angina pectoris: Secondary | ICD-10-CM | POA: Insufficient documentation

## 2020-02-24 DIAGNOSIS — I480 Paroxysmal atrial fibrillation: Secondary | ICD-10-CM | POA: Insufficient documentation

## 2020-02-24 DIAGNOSIS — F411 Generalized anxiety disorder: Secondary | ICD-10-CM | POA: Diagnosis not present

## 2020-02-24 DIAGNOSIS — R001 Bradycardia, unspecified: Secondary | ICD-10-CM | POA: Insufficient documentation

## 2020-02-24 DIAGNOSIS — N1831 Chronic kidney disease, stage 3a: Secondary | ICD-10-CM | POA: Insufficient documentation

## 2020-02-24 DIAGNOSIS — D696 Thrombocytopenia, unspecified: Secondary | ICD-10-CM | POA: Insufficient documentation

## 2020-02-24 DIAGNOSIS — Q231 Congenital insufficiency of aortic valve: Secondary | ICD-10-CM | POA: Insufficient documentation

## 2020-02-24 DIAGNOSIS — I5032 Chronic diastolic (congestive) heart failure: Secondary | ICD-10-CM | POA: Diagnosis not present

## 2020-02-24 DIAGNOSIS — D631 Anemia in chronic kidney disease: Secondary | ICD-10-CM | POA: Diagnosis not present

## 2020-02-24 DIAGNOSIS — I130 Hypertensive heart and chronic kidney disease with heart failure and stage 1 through stage 4 chronic kidney disease, or unspecified chronic kidney disease: Secondary | ICD-10-CM | POA: Diagnosis not present

## 2020-02-24 DIAGNOSIS — Z87891 Personal history of nicotine dependence: Secondary | ICD-10-CM | POA: Insufficient documentation

## 2020-02-24 HISTORY — PX: CARDIOVERSION: SHX1299

## 2020-02-24 SURGERY — CARDIOVERSION
Anesthesia: General

## 2020-02-24 MED ORDER — PROPOFOL 10 MG/ML IV BOLUS
INTRAVENOUS | Status: DC | PRN
  Administered 2020-02-24: 10 mg via INTRAVENOUS
  Administered 2020-02-24: 40 mg via INTRAVENOUS
  Administered 2020-02-24: 20 mg via INTRAVENOUS

## 2020-02-24 MED ORDER — SODIUM CHLORIDE 0.9 % IV SOLN
INTRAVENOUS | Status: DC | PRN
Start: 2020-02-24 — End: 2020-02-24

## 2020-02-24 MED ORDER — PHENYLEPHRINE HCL (PRESSORS) 10 MG/ML IV SOLN
INTRAVENOUS | Status: DC | PRN
  Administered 2020-02-24 (×2): 100 ug via INTRAVENOUS

## 2020-02-24 MED ORDER — EPHEDRINE SULFATE 50 MG/ML IJ SOLN
INTRAMUSCULAR | Status: DC | PRN
  Administered 2020-02-24: 5 mg via INTRAVENOUS

## 2020-02-24 NOTE — Op Note (Signed)
[facility]   02/24/2020                     8:17 AM  PATIENT:  Samuel Mcknight    PRE-OPERATIVE DIAGNOSIS:  Cardioversion   Afib OK 2nd case per Dr Bertell Maria   Start time 8a  POST-OPERATIVE DIAGNOSIS:  Same  PROCEDURE:  CARDIOVERSION  SURGEON:  Isaias Cowman, MD    ANESTHESIA:     PREOPERATIVE INDICATIONS:  [name] is a  79 y.o. male with a diagnosis of Cardioversion   Afib OK 2nd case per Dr Bertell Maria   Start time 8a who failed conservative measures and elected for surgical management.    The risks benefits and alternatives were discussed with the patient preoperatively including but not limited to the risks of infection, bleeding, cardiopulmonary complications, the need for revision surgery, among others, and the patient was willing to proceed.   OPERATIVE PROCEDURE: Patient was brought to special procedures holding area in a fasting state.  Patient received 70 mg of propofol.  The patient underwent elective electrical cardioversion with 70 J, 20 J, and 200 J with successful conversion to sinus rhythm.  There were no periprocedural complications.

## 2020-02-24 NOTE — Discharge Instructions (Signed)
Take Benadryl 25mg  twice daily for rash And otherwise resume all previously prescribed medications

## 2020-02-24 NOTE — Anesthesia Preprocedure Evaluation (Addendum)
Anesthesia Evaluation  Patient identified by MRN, date of birth, ID band Patient awake    Reviewed: Allergy & Precautions, H&P , NPO status , reviewed documented beta blocker date and time   Airway Mallampati: III  TM Distance: >3 FB Neck ROM: limited    Dental  (+) Upper Dentures, Chipped, Missing   Pulmonary former smoker,    Pulmonary exam normal        Cardiovascular hypertension, + dysrhythmias Atrial Fibrillation      Neuro/Psych PSYCHIATRIC DISORDERS Anxiety    GI/Hepatic neg GERD  ,  Endo/Other    Renal/GU Renal disease     Musculoskeletal   Abdominal   Peds  Hematology  (+) Blood dyscrasia, anemia ,   Anesthesia Other Findings Past Medical History: No date: Atrial fibrillation ([facility]) No date: GAD (generalized anxiety disorder) No date: Hypertension No date: Prediabetes  Past Surgical History: 2005: supraorbital artery repair No date: TOOTH EXTRACTION     Reproductive/Obstetrics                            Anesthesia Physical Anesthesia Plan  ASA: III  Anesthesia Plan: General   Post-op Pain Management:    Induction: Intravenous  PONV Risk Score and Plan: Treatment may vary due to age or medical condition and TIVA  Airway Management Planned: Nasal Cannula and Natural Airway  Additional Equipment:   Intra-op Plan:   Post-operative Plan:   Informed Consent: I have reviewed the patients History and Physical, chart, labs and discussed the procedure including the risks, benefits and alternatives for the proposed anesthesia with the patient or authorized representative who has indicated his/her understanding and acceptance.     Dental Advisory Given  Plan Discussed with: CRNA  Anesthesia Plan Comments:         Anesthesia Quick Evaluation

## 2020-02-24 NOTE — Anesthesia Postprocedure Evaluation (Signed)
Anesthesia Post Note  Patient: Vivien Presto  Procedure(s) Performed: CARDIOVERSION (N/A )  Patient location during evaluation: Specials Recovery Anesthesia Type: General Level of consciousness: awake and alert Pain management: pain level controlled Vital Signs Assessment: post-procedure vital signs reviewed and stable Respiratory status: spontaneous breathing, nonlabored ventilation and respiratory function stable Cardiovascular status: blood pressure returned to baseline and stable Postop Assessment: no apparent nausea or vomiting Anesthetic complications: no   No complications documented.   Last Vitals:  Vitals:   02/24/20 0845 02/24/20 0858  BP: (!) 91/45 (!) 91/51  Pulse: 62 62  Resp: 18 (!) 21  Temp:    SpO2: 93% 92%    Last Pain:  Vitals:   02/24/20 0858  TempSrc:   PainSc: 0-No pain                 Alphonsus Sias

## 2020-02-24 NOTE — Transfer of Care (Signed)
Immediate Anesthesia Transfer of Care Note  Patient: Samuel Mcknight  Procedure(s) Performed: CARDIOVERSION (N/A )  Patient Location: PACU  Anesthesia Type:General  Level of Consciousness: awake, alert  and oriented  Airway & Oxygen Therapy: Patient Spontanous Breathing and Patient connected to nasal cannula oxygen  Post-op Assessment: Report given to RN and Post -op Vital signs reviewed and stable  Post vital signs: Reviewed and stable  Last Vitals:  Vitals Value Taken Time  BP 118/51 02/24/20 0827  Temp    Pulse 61 02/24/20 0828  Resp 19 02/24/20 0828  SpO2 97 % 02/24/20 0828    Last Pain:  Vitals:   02/24/20 0757  TempSrc: Oral  PainSc: 0-No pain         Complications: No complications documented.

## 2020-02-25 ENCOUNTER — Encounter: Payer: Self-pay | Admitting: Cardiology

## 2020-03-07 ENCOUNTER — Inpatient Hospital Stay: Payer: Medicare Other

## 2020-03-07 ENCOUNTER — Other Ambulatory Visit: Payer: Self-pay

## 2020-03-07 ENCOUNTER — Inpatient Hospital Stay: Payer: Medicare Other | Attending: Internal Medicine

## 2020-03-07 DIAGNOSIS — D631 Anemia in chronic kidney disease: Secondary | ICD-10-CM | POA: Diagnosis not present

## 2020-03-07 DIAGNOSIS — N183 Chronic kidney disease, stage 3 unspecified: Secondary | ICD-10-CM | POA: Diagnosis not present

## 2020-03-07 DIAGNOSIS — D693 Immune thrombocytopenic purpura: Secondary | ICD-10-CM | POA: Diagnosis not present

## 2020-03-07 LAB — CBC WITH DIFFERENTIAL/PLATELET
Abs Immature Granulocytes: 0.05 10*3/uL (ref 0.00–0.07)
Basophils Absolute: 0.1 10*3/uL (ref 0.0–0.1)
Basophils Relative: 1 %
Eosinophils Absolute: 0.3 10*3/uL (ref 0.0–0.5)
Eosinophils Relative: 3 %
HCT: 31.4 % — ABNORMAL LOW (ref 39.0–52.0)
Hemoglobin: 10.4 g/dL — ABNORMAL LOW (ref 13.0–17.0)
Immature Granulocytes: 1 %
Lymphocytes Relative: 16 %
Lymphs Abs: 1.4 10*3/uL (ref 0.7–4.0)
MCH: 31.1 pg (ref 26.0–34.0)
MCHC: 33.1 g/dL (ref 30.0–36.0)
MCV: 94 fL (ref 80.0–100.0)
Monocytes Absolute: 1.1 10*3/uL — ABNORMAL HIGH (ref 0.1–1.0)
Monocytes Relative: 13 %
Neutro Abs: 5.8 10*3/uL (ref 1.7–7.7)
Neutrophils Relative %: 66 %
Platelets: 269 10*3/uL (ref 150–400)
RBC: 3.34 MIL/uL — ABNORMAL LOW (ref 4.22–5.81)
RDW: 16.9 % — ABNORMAL HIGH (ref 11.5–15.5)
WBC: 8.8 10*3/uL (ref 4.0–10.5)
nRBC: 0 % (ref 0.0–0.2)

## 2020-04-04 ENCOUNTER — Other Ambulatory Visit: Payer: Self-pay

## 2020-04-04 ENCOUNTER — Inpatient Hospital Stay (HOSPITAL_BASED_OUTPATIENT_CLINIC_OR_DEPARTMENT_OTHER): Payer: Medicare Other | Admitting: Internal Medicine

## 2020-04-04 ENCOUNTER — Inpatient Hospital Stay: Payer: Medicare Other

## 2020-04-04 ENCOUNTER — Inpatient Hospital Stay: Payer: Medicare Other | Attending: Internal Medicine

## 2020-04-04 VITALS — BP 110/79 | HR 106 | Temp 96.5°F | Resp 16 | Wt 166.2 lb

## 2020-04-04 DIAGNOSIS — N183 Chronic kidney disease, stage 3 unspecified: Secondary | ICD-10-CM

## 2020-04-04 DIAGNOSIS — D631 Anemia in chronic kidney disease: Secondary | ICD-10-CM | POA: Diagnosis present

## 2020-04-04 LAB — CBC WITH DIFFERENTIAL/PLATELET
Abs Immature Granulocytes: 0.02 10*3/uL (ref 0.00–0.07)
Basophils Absolute: 0.1 10*3/uL (ref 0.0–0.1)
Basophils Relative: 1 %
Eosinophils Absolute: 0.2 10*3/uL (ref 0.0–0.5)
Eosinophils Relative: 3 %
HCT: 30.1 % — ABNORMAL LOW (ref 39.0–52.0)
Hemoglobin: 9.9 g/dL — ABNORMAL LOW (ref 13.0–17.0)
Immature Granulocytes: 0 %
Lymphocytes Relative: 17 %
Lymphs Abs: 1.3 10*3/uL (ref 0.7–4.0)
MCH: 31.1 pg (ref 26.0–34.0)
MCHC: 32.9 g/dL (ref 30.0–36.0)
MCV: 94.7 fL (ref 80.0–100.0)
Monocytes Absolute: 1.1 10*3/uL — ABNORMAL HIGH (ref 0.1–1.0)
Monocytes Relative: 14 %
Neutro Abs: 5.2 10*3/uL (ref 1.7–7.7)
Neutrophils Relative %: 65 %
Platelets: 237 10*3/uL (ref 150–400)
RBC: 3.18 MIL/uL — ABNORMAL LOW (ref 4.22–5.81)
RDW: 17.6 % — ABNORMAL HIGH (ref 11.5–15.5)
WBC: 8 10*3/uL (ref 4.0–10.5)
nRBC: 0 % (ref 0.0–0.2)

## 2020-04-04 MED ORDER — EPOETIN ALFA-EPBX 10000 UNIT/ML IJ SOLN
20000.0000 [IU] | Freq: Once | INTRAMUSCULAR | Status: AC
  Administered 2020-04-04: 20000 [IU] via SUBCUTANEOUS
  Filled 2020-04-04: qty 2

## 2020-04-04 MED ORDER — EPOETIN ALFA-EPBX 10000 UNIT/ML IJ SOLN: 20000.0000 [IU] | Freq: Once | INTRAMUSCULAR | Status: DC

## 2020-04-04 NOTE — Assessment & Plan Note (Addendum)
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 Proceed with Retacrit today.  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  STABLE.   #  A. fib on Coumadin;s/p [Dr.Paraschoes] cardioversion; currently in A.fib-HR 110- ? Cause of fatigue- recommend follow up with cardiology.   # CKD stage-III-IV- follow up with PCP.   # DISPOSITION:  # RETACRIT #in 2 months- MD; cbc/bmp/iron studies/ferritin-possible retacrit/ -  Dr.B

## 2020-04-04 NOTE — Progress Notes (Signed)
[name] NOTE  Patient Care Team: Kirk Ruths, MD as PCP - General (Internal Medicine) Cammie Sickle, MD as Consulting Physician (Internal Medicine)  CHIEF COMPLAINTS/PURPOSE OF CONSULTATION: Anemia of chronic kidney disease/thrombocytopenia   HEMATOLOGY HISTORY  # CHRONIC ANEMIA- [at least 2018]- worsening- July 2020- 9/ nomocytic; NO Coloscopy; EGD- [place], 2017; October 2020-bone marrow biopsy-no overt myelodysplasia noted; subtle dyspoietic changes noted/karyotype normal; MDS FISH[neogenomics]- NEG panel; HOLD off NGS.   #Chronic mild thrombocytopenia [120s]-question etiology; ? 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  In the interim patient underwent cardioversion for his A. fib.  Patient denies any blood in stools or black or stools.  Complains of fatigue.  Notes to have continued shortness of breath and fatigue although improved from baseline starting Retacrit.  Review of Systems  Constitutional: Positive for malaise/fatigue. Negative for chills, diaphoresis, fever and weight loss.  HENT: Negative for nosebleeds and sore throat.   Eyes: Negative for double vision.  Respiratory: Positive for shortness of breath. Negative for cough, hemoptysis, sputum production and wheezing.   Cardiovascular: Negative for chest pain, palpitations, orthopnea and leg swelling.  Gastrointestinal: Negative for abdominal pain, blood in stool, constipation, diarrhea, heartburn, melena, nausea and vomiting.  Genitourinary: Negative for dysuria, frequency and urgency.  Musculoskeletal: Negative for back  pain and joint pain.  Skin: Negative.  Negative for itching and rash.  Neurological: Negative for dizziness, tingling, focal weakness, weakness and headaches.  Endo/Heme/Allergies: Does not bruise/bleed easily.  Psychiatric/Behavioral: Negative for depression. The patient is not nervous/anxious and does not have insomnia.     MEDICAL HISTORY:  Past Medical History:  Diagnosis Date  . Atrial fibrillation ([place])   . GAD (generalized anxiety disorder)   . Hypertension   . Prediabetes     SURGICAL HISTORY: Past Surgical History:  Procedure Laterality Date  . CARDIOVERSION N/A 02/24/2020   Procedure: CARDIOVERSION;  Surgeon: Isaias Cowman, MD;  Location: [facility] ORS;  Service: Cardiovascular;  Laterality: N/A;  . supraorbital artery repair  2005  . TOOTH EXTRACTION      SOCIAL HISTORY: Social History   Socioeconomic History  . Marital status: Single    Spouse name: Not on file  . Number of children: Not on file  . Years of education: Not on file  . Highest education level: Not on file  Occupational History  . Not on file  Tobacco Use  . Smoking status: Former Smoker    Types: Cigarettes  . Smokeless tobacco: Former Systems developer    Quit date: 2006  Vaping Use  . Vaping Use: Former  . Quit date: 03/14/2005  Substance and Sexual Activity  . Alcohol use: Not Currently    Comment: quit   . Drug use: Not Currently  . Sexual activity: Not on file  Other Topics Concern  . 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    Social Determinants of Health   Financial Resource Strain:   . Difficulty of Paying Living Expenses:   Food Insecurity:   . Worried About Charity fundraiser in the Last Year:   .  Ran Out of Food in the Last Year:   Transportation Needs:   . Film/video editor (Medical):   Marland Kitchen Lack of Transportation (Non-Medical):   Physical Activity:   . Days of Exercise per Week:   . Minutes  of Exercise per Session:   Stress:   . Feeling of Stress :   Social Connections:   . Frequency of Communication with Friends and Family:   . Frequency of Social Gatherings with Friends and Family:   . Attends Religious Services:   . Active Member of Clubs or Organizations:   . Attends Archivist Meetings:   Marland Kitchen Marital Status:   Intimate Partner Violence:   . Fear of Current or Ex-Partner:   . Emotionally Abused:   Marland Kitchen Physically Abused:   . Sexually Abused:     FAMILY HISTORY: Family History  Problem Relation Age of Onset  . Lung cancer Father     ALLERGIES:  is allergic to bupropion, amiodarone, and diltiazem.  MEDICATIONS:  Current Outpatient Medications  Medication Sig Dispense Refill  . ALPRAZolam (XANAX) 0.5 MG tablet Take 0.5 mg by mouth 2 (two) times daily as needed for anxiety.     Marland Kitchen diltiazem (TIAZAC) 180 MG 24 hr capsule Take 180 mg by mouth daily.    Marland Kitchen eltrombopag (PROMACTA) 50 MG tablet Take 1 tablet (50 mg total) by mouth daily. Take on an empty stomach 1 hour before a meal or 2 hours after 30 tablet 3  . furosemide (LASIX) 20 MG tablet Take 20 mg by mouth daily.    . metoprolol tartrate (LOPRESSOR) 50 MG tablet Take 50 mg by mouth 2 (two) times daily.    Marland Kitchen warfarin (COUMADIN) 3 MG tablet Take 3-4.5 mg by mouth See admin instructions. Take 3 mg on Mon, Wed, Fri, and Sat. Take 4.5 mg on Tues, Thurs, and Sun.    . sertraline (ZOLOFT) 50 MG tablet Take 50 mg by mouth daily as needed (anxiety/depression).      Current Facility-Administered Medications  Medication Dose Route Frequency Provider Last Rate Last Admin  . epoetin alfa-epbx (RETACRIT) injection 20,000 Units  20,000 Units Subcutaneous Once Charlaine Dalton R, MD          PHYSICAL EXAMINATION:   Vitals:   04/04/20 1440  BP: 110/79  Pulse: (!) 106  Resp: 16  Temp: (!) 96.5 F (35.8 C)  SpO2: 94%   Filed Weights   04/04/20 1440  Weight: 166 lb 3.2 oz (75.4 kg)    Physical  Exam HENT:     Head: Normocephalic and atraumatic.     Mouth/Throat:     Pharynx: No oropharyngeal exudate.  Eyes:     Pupils: Pupils are equal, round, and reactive to light.  Cardiovascular:     Comments: Irregular irregular rhythm; mild tachycardia; positive for murmur. Pulmonary:     Effort: Pulmonary effort is normal. No respiratory distress.     Breath sounds: Normal breath sounds. No wheezing.  Abdominal:     General: Bowel sounds are normal. There is no distension.     Palpations: Abdomen is soft. There is no mass.     Tenderness: There is no abdominal tenderness. There is no guarding or rebound.  Musculoskeletal:        General: No tenderness. Normal range of motion.     Cervical back: Normal range of motion and neck supple.  Skin:    General: Skin is warm.     Comments: Chronic bruising noted.  Bilateral  upper extremities.  Neurological:     Mental Status: He is alert and oriented to person, place, and time.  Psychiatric:        Mood and Affect: Affect normal.     LABORATORY DATA:  I have reviewed the data as listed Lab Results  Component Value Date   WBC 8.0 04/04/2020   HGB 9.9 (L) 04/04/2020   HCT 30.1 (L) 04/04/2020   MCV 94.7 04/04/2020   PLT 237 04/04/2020   Recent Labs    08/23/19 1239 10/04/19 1343 12/13/19 1412 12/27/19 1425 02/22/20 1501  NA 137   < > 139 135 140  K 4.6   < > 4.3 4.6 4.1  CL 106   < > 102 99 102  CO2 25   < > '27 25 28  ' GLUCOSE 95   < > 99 130* 124*  BUN 32*   < > 48* 51* 27*  CREATININE 1.72*   < > 1.77* 1.61* 2.35*  CALCIUM 9.1   < > 9.1 8.9 9.0  GFRNONAA 37*   < > 36* 40* 26*  GFRAA 43*   < > 42* 47* 30*  PROT 8.3*  --   --   --   --   ALBUMIN 4.4  --   --   --   --   AST 18  --   --   --   --   ALT 14  --   --   --   --   ALKPHOS 56  --   --   --   --   BILITOT 1.1  --   --   --   --    < > = values in this interval not displayed.     No results found. 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 Proceed with Retacrit today.  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  STABLE.   #  A. fib on Coumadin;s/p [Dr.Paraschoes] cardioversion; currently in A.fib-HR 110- ? Cause of fatigue- recommend follow up with cardiology.   # CKD stage-III-IV- follow up with PCP.   # DISPOSITION:  # RETACRIT #in 2 months- MD; cbc/bmp/iron studies/ferritin-possible retacrit/ -  Dr.B   All questions were answered. The patient knows to call the clinic with any problems, questions or concerns.    GCammie Sickle MD 04/04/2020 3:06 PM

## 2020-06-06 ENCOUNTER — Other Ambulatory Visit: Payer: Self-pay

## 2020-06-06 ENCOUNTER — Inpatient Hospital Stay: Payer: Medicare Other | Attending: Internal Medicine

## 2020-06-06 ENCOUNTER — Encounter: Payer: Self-pay | Admitting: Internal Medicine

## 2020-06-06 ENCOUNTER — Inpatient Hospital Stay (HOSPITAL_BASED_OUTPATIENT_CLINIC_OR_DEPARTMENT_OTHER): Payer: Medicare Other | Admitting: Internal Medicine

## 2020-06-06 ENCOUNTER — Inpatient Hospital Stay: Payer: Medicare Other

## 2020-06-06 DIAGNOSIS — N183 Chronic kidney disease, stage 3 unspecified: Secondary | ICD-10-CM

## 2020-06-06 DIAGNOSIS — D631 Anemia in chronic kidney disease: Secondary | ICD-10-CM

## 2020-06-06 LAB — CBC WITH DIFFERENTIAL/PLATELET
Abs Immature Granulocytes: 0.04 10*3/uL (ref 0.00–0.07)
Basophils Absolute: 0 10*3/uL (ref 0.0–0.1)
Basophils Relative: 0 %
Eosinophils Absolute: 0.1 10*3/uL (ref 0.0–0.5)
Eosinophils Relative: 1 %
HCT: 28.5 % — ABNORMAL LOW (ref 39.0–52.0)
Hemoglobin: 9.5 g/dL — ABNORMAL LOW (ref 13.0–17.0)
Immature Granulocytes: 0 %
Lymphocytes Relative: 10 %
Lymphs Abs: 1 10*3/uL (ref 0.7–4.0)
MCH: 31 pg (ref 26.0–34.0)
MCHC: 33.3 g/dL (ref 30.0–36.0)
MCV: 93.1 fL (ref 80.0–100.0)
Monocytes Absolute: 1.7 10*3/uL — ABNORMAL HIGH (ref 0.1–1.0)
Monocytes Relative: 17 %
Neutro Abs: 7 10*3/uL (ref 1.7–7.7)
Neutrophils Relative %: 72 %
Platelets: 201 10*3/uL (ref 150–400)
RBC: 3.06 MIL/uL — ABNORMAL LOW (ref 4.22–5.81)
RDW: 18.5 % — ABNORMAL HIGH (ref 11.5–15.5)
WBC: 9.9 10*3/uL (ref 4.0–10.5)
nRBC: 0 % (ref 0.0–0.2)

## 2020-06-06 MED ORDER — EPOETIN ALFA-EPBX 10000 UNIT/ML IJ SOLN
20000.0000 [IU] | Freq: Once | INTRAMUSCULAR | Status: AC
  Administered 2020-06-06: 20000 [IU] via SUBCUTANEOUS
  Filled 2020-06-06: qty 2

## 2020-06-06 NOTE — Progress Notes (Signed)
[place] NOTE  Patient Care Team: Kirk Ruths, MD as PCP - General (Internal Medicine) Cammie Sickle, MD as Consulting Physician (Internal Medicine)  CHIEF COMPLAINTS/PURPOSE OF CONSULTATION: Anemia of chronic kidney disease/thrombocytopenia   HEMATOLOGY HISTORY  # CHRONIC ANEMIA- [at least 2018]- worsening- July 2020- 9/ nomocytic; NO Coloscopy; EGD- [place], 2017; October 2020-bone marrow biopsy-no overt myelodysplasia noted; subtle dyspoietic changes noted/karyotype normal; MDS FISH[neogenomics]- NEG panel; HOLD off NGS.   #Chronic mild thrombocytopenia [120s]-question etiology; ? 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  Patient states that he taken out meat out of his diet; try to lose weight.  He lost about 16 pounds in the last 6 months.  He feels good otherwise.  Chronic mild fatigue.  Mild shortness of breath on exertion.  No swelling in the legs.  No abdominal pain.  He ran out of Promacta stopped taking it.  No bleeding or bruising.  Review of Systems  Constitutional: Positive for malaise/fatigue. Negative for chills, diaphoresis, fever and weight loss.  HENT: Negative for nosebleeds and sore throat.   Eyes: Negative for double vision.  Respiratory: Positive for shortness of breath. Negative for cough, hemoptysis, sputum production and wheezing.   Cardiovascular: Negative for chest pain, palpitations, orthopnea and leg swelling.  Gastrointestinal: Negative for abdominal pain, blood in stool, constipation, diarrhea, heartburn, melena, nausea and  vomiting.  Genitourinary: Negative for dysuria, frequency and urgency.  Musculoskeletal: Negative for back pain and joint pain.  Skin: Negative.  Negative for itching and rash.  Neurological: Negative for dizziness, tingling, focal weakness, weakness and headaches.  Endo/Heme/Allergies: Does not bruise/bleed easily.  Psychiatric/Behavioral: Negative for depression. The patient is not nervous/anxious and does not have insomnia.     MEDICAL HISTORY:  Past Medical History:  Diagnosis Date  . Atrial fibrillation ([place])   . GAD (generalized anxiety disorder)   . Hypertension   . Prediabetes     SURGICAL HISTORY: Past Surgical History:  Procedure Laterality Date  . CARDIOVERSION N/A 02/24/2020   Procedure: CARDIOVERSION;  Surgeon: Isaias Cowman, MD;  Location: [facility] ORS;  Service: Cardiovascular;  Laterality: N/A;  . supraorbital artery repair  2005  . TOOTH EXTRACTION      SOCIAL HISTORY: Social History   Socioeconomic History  . Marital status: Single    Spouse name: Not on file  . Number of children: Not on file  . Years of education: Not on file  . Highest education level: Not on file  Occupational History  . Not on file  Tobacco Use  . Smoking status: Former Smoker    Types: Cigarettes  . Smokeless tobacco: Former Systems developer    Quit date: 2006  Vaping Use  . Vaping Use: Former  . Quit date: 03/14/2005  Substance and Sexual Activity  . Alcohol use: Not Currently    Comment: quit   . Drug use: Not Currently  . Sexual activity: Not on file  Other Topics Concern  . 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    Social Determinants of Health  Financial Resource Strain:   . Difficulty of Paying Living Expenses: Not on file  Food Insecurity:   . Worried About Charity fundraiser in the Last Year: Not on file  . Ran Out of Food in the Last Year: Not on file  Transportation Needs:   .  Lack of Transportation (Medical): Not on file  . Lack of Transportation (Non-Medical): Not on file  Physical Activity:   . Days of Exercise per Week: Not on file  . Minutes of Exercise per Session: Not on file  Stress:   . Feeling of Stress : Not on file  Social Connections:   . Frequency of Communication with Friends and Family: Not on file  . Frequency of Social Gatherings with Friends and Family: Not on file  . Attends Religious Services: Not on file  . Active Member of Clubs or Organizations: Not on file  . Attends Archivist Meetings: Not on file  . Marital Status: Not on file  Intimate Partner Violence:   . Fear of Current or Ex-Partner: Not on file  . Emotionally Abused: Not on file  . Physically Abused: Not on file  . Sexually Abused: Not on file    FAMILY HISTORY: Family History  Problem Relation Age of Onset  . Lung cancer Father     ALLERGIES:  is allergic to bupropion, amiodarone, and diltiazem.  MEDICATIONS:  Current Outpatient Medications  Medication Sig Dispense Refill  . ALPRAZolam (XANAX) 0.5 MG tablet Take 0.5 mg by mouth 2 (two) times daily as needed for anxiety.     . digoxin (LANOXIN) 0.125 MG tablet Take by mouth.    . furosemide (LASIX) 20 MG tablet Take 20 mg by mouth daily.    . metoprolol tartrate (LOPRESSOR) 50 MG tablet Take 50 mg by mouth 2 (two) times daily.    Marland Kitchen warfarin (COUMADIN) 3 MG tablet Take 3-4.5 mg by mouth See admin instructions. Take 3 mg on Mon, Wed, Fri, and Sat. Take 4.5 mg on Tues, Thurs, and Sun.    . diltiazem (TIAZAC) 180 MG 24 hr capsule Take 180 mg by mouth daily. (Patient not taking: Reported on 06/06/2020)    . eltrombopag (PROMACTA) 50 MG tablet Take 1 tablet (50 mg total) by mouth daily. Take on an empty stomach 1 hour before a meal or 2 hours after (Patient not taking: Reported on 06/06/2020) 30 tablet 3  . sertraline (ZOLOFT) 50 MG tablet Take 50 mg by mouth daily as needed (anxiety/depression).  (Patient not  taking: Reported on 06/06/2020)     No current facility-administered medications for this visit.   Facility-Administered Medications Ordered in Other Visits  Medication Dose Route Frequency Provider Last Rate Last Admin  . epoetin alfa-epbx (RETACRIT) injection 20,000 Units  20,000 Units Subcutaneous Once Charlaine Dalton R, MD          PHYSICAL EXAMINATION:   Vitals:   06/06/20 1331  BP: 116/72  Pulse: 89  Resp: 16  Temp: (!) 97.5 F (36.4 C)  SpO2: 98%   Filed Weights   06/06/20 1331  Weight: 156 lb 9.6 oz (71 kg)    Physical Exam HENT:     Head: Normocephalic and atraumatic.     Mouth/Throat:     Pharynx: No oropharyngeal exudate.  Eyes:     Pupils: Pupils are equal, round, and reactive to light.  Cardiovascular:     Comments: Irregular irregular rhythm; mild tachycardia; positive for murmur. Pulmonary:     Effort: Pulmonary  effort is normal. No respiratory distress.     Breath sounds: Normal breath sounds. No wheezing.  Abdominal:     General: Bowel sounds are normal. There is no distension.     Palpations: Abdomen is soft. There is no mass.     Tenderness: There is no abdominal tenderness. There is no guarding or rebound.  Musculoskeletal:        General: No tenderness. Normal range of motion.     Cervical back: Normal range of motion and neck supple.  Skin:    General: Skin is warm.     Comments: Chronic bruising noted.  Bilateral upper extremities.  Neurological:     Mental Status: He is alert and oriented to person, place, and time.  Psychiatric:        Mood and Affect: Affect normal.     LABORATORY DATA:  I have reviewed the data as listed Lab Results  Component Value Date   WBC 9.9 06/06/2020   HGB 9.5 (L) 06/06/2020   HCT 28.5 (L) 06/06/2020   MCV 93.1 06/06/2020   PLT 201 06/06/2020   Recent Labs    08/23/19 1239 10/04/19 1343 12/13/19 1412 12/27/19 1425 02/22/20 1501  NA 137   < > 139 135 140  K 4.6   < > 4.3 4.6 4.1  CL 106    < > 102 99 102  CO2 25   < > _0 GLUCOSE 95   < > 99 130* 124*  BUN 32*   < > 48* 51* 27*  CREATININE 1.72*   < > 1.77* 1.61* 2.35*  CALCIUM 9.1   < > 9.1 8.9 9.0  GFRNONAA 37*   < > 36* 40* 26*  GFRAA 43*   < > 42* 47* 30*  PROT 8.3*  --   --   --   --   ALBUMIN 4.4  --   --   --   --   AST 18  --   --   --   --   ALT 14  --   --   --   --   ALKPHOS 56  --   --   --   --   BILITOT 1.1  --   --   --   --    < > = values in this interval not displayed.     No results found. 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 Proceed with Retacrit today.  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   #  A. fib on Coumadin;s/p [Dr.Paraschoes] s/p cardioversion. INR-2.6 KC; STABLE  # weight loss- intentional  [16 pounds/ 671month. Monitor for now.  If more weight loss would recommend CT scan abdomen pelvis.  Ultrasound 2020 no hepatosplenomegaly.  # CKD stage-III-IV- follow up with PCP.   # DISPOSITION:  # RETACRIT today #in 2 months- MD; cbc/bmp/iron studies/ferritin-possible retacrit/ -  Dr.B   All questions were answered. The patient knows to call the clinic with any problems, questions or concerns.    GoCammie SickleMD 06/06/2020 2:10 PM

## 2020-06-06 NOTE — Progress Notes (Signed)
Pt states he has not taken promacta in a month. States he ran out of it and was not sure if he was to continue it or not.

## 2020-06-06 NOTE — Assessment & Plan Note (Addendum)
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 Proceed with Retacrit today.  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   #  A. fib on Coumadin;s/p [Dr.Paraschoes] s/p cardioversion. INR-2.6 KC; STABLE  # weight loss- intentional  [16 pounds/ 52months]. Monitor for now.  If more weight loss would recommend CT scan abdomen pelvis.  Ultrasound 2020 no hepatosplenomegaly.  # CKD stage-III-IV- follow up with PCP.   # DISPOSITION:  # RETACRIT today #in 2 months- MD; cbc/bmp/iron studies/ferritin-possible retacrit/ -  Dr.B

## 2020-08-08 ENCOUNTER — Other Ambulatory Visit: Payer: Self-pay

## 2020-08-08 ENCOUNTER — Encounter: Payer: Self-pay | Admitting: Internal Medicine

## 2020-08-08 ENCOUNTER — Inpatient Hospital Stay: Payer: Medicare Other

## 2020-08-08 ENCOUNTER — Inpatient Hospital Stay (HOSPITAL_BASED_OUTPATIENT_CLINIC_OR_DEPARTMENT_OTHER): Payer: Medicare Other | Admitting: Internal Medicine

## 2020-08-08 ENCOUNTER — Inpatient Hospital Stay: Payer: Medicare Other | Attending: Internal Medicine

## 2020-08-08 ENCOUNTER — Telehealth: Payer: Self-pay | Admitting: Pharmacy Technician

## 2020-08-08 DIAGNOSIS — D631 Anemia in chronic kidney disease: Secondary | ICD-10-CM

## 2020-08-08 DIAGNOSIS — N183 Chronic kidney disease, stage 3 unspecified: Secondary | ICD-10-CM | POA: Diagnosis not present

## 2020-08-08 LAB — CBC WITH DIFFERENTIAL/PLATELET
Abs Immature Granulocytes: 0.02 10*3/uL (ref 0.00–0.07)
Basophils Absolute: 0.1 10*3/uL (ref 0.0–0.1)
Basophils Relative: 1 %
Eosinophils Absolute: 0.8 10*3/uL — ABNORMAL HIGH (ref 0.0–0.5)
Eosinophils Relative: 9 %
HCT: 27.9 % — ABNORMAL LOW (ref 39.0–52.0)
Hemoglobin: 9.2 g/dL — ABNORMAL LOW (ref 13.0–17.0)
Immature Granulocytes: 0 %
Lymphocytes Relative: 25 %
Lymphs Abs: 2.1 10*3/uL (ref 0.7–4.0)
MCH: 32.1 pg (ref 26.0–34.0)
MCHC: 33 g/dL (ref 30.0–36.0)
MCV: 97.2 fL (ref 80.0–100.0)
Monocytes Absolute: 1.5 10*3/uL — ABNORMAL HIGH (ref 0.1–1.0)
Monocytes Relative: 18 %
Neutro Abs: 3.8 10*3/uL (ref 1.7–7.7)
Neutrophils Relative %: 47 %
Platelets: 103 10*3/uL — ABNORMAL LOW (ref 150–400)
RBC: 2.87 MIL/uL — ABNORMAL LOW (ref 4.22–5.81)
RDW: 17.6 % — ABNORMAL HIGH (ref 11.5–15.5)
WBC: 8.3 10*3/uL (ref 4.0–10.5)
nRBC: 0 % (ref 0.0–0.2)

## 2020-08-08 MED ORDER — EPOETIN ALFA-EPBX 10000 UNIT/ML IJ SOLN
20000.0000 [IU] | Freq: Once | INTRAMUSCULAR | Status: AC
  Administered 2020-08-08: 20000 [IU] via SUBCUTANEOUS
  Filled 2020-08-08: qty 2

## 2020-08-08 NOTE — Telephone Encounter (Signed)
Oral Oncology Patient Advocate Encounter  Spoke with patient at appointment to sign re-enrollment application for [employer] Patient Assistance [employer] in an effort to reduce patient's out of pocket expense for Promacta to $0.    Once application is signed by the physician I will fax to [employer].   [place] Patient [place] Phone [phone] Fax [phone] 08/10/2020 1:55 PM

## 2020-08-08 NOTE — Progress Notes (Signed)
[place] NOTE  Patient Care Team: Kirk Ruths, MD as PCP - General (Internal Medicine) Cammie Sickle, MD as Consulting Physician (Internal Medicine)  CHIEF COMPLAINTS/PURPOSE OF CONSULTATION: Anemia of chronic kidney disease/thrombocytopenia   HEMATOLOGY HISTORY  # CHRONIC ANEMIA- [at least 2018]- worsening- July 2020- 9/ nomocytic; NO Coloscopy; EGD- [place], 2017; October 2020-bone marrow biopsy-no overt myelodysplasia noted; subtle dyspoietic changes noted/karyotype normal; MDS FISH[neogenomics]- NEG panel; HOLD off NGS.   #Chronic mild thrombocytopenia [120s]-question etiology; ? 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  Patient stated that he ran out of Promacta about 3 months ago.  Denies any easy bruising or bleeding.  He continues to be on Coumadin.  Denies any swelling in the legs but denies any worsening shortness of breath or cough.  Mild to moderate fatigue.  Review of Systems  Constitutional: Positive for malaise/fatigue. Negative for chills, diaphoresis, fever and weight loss.  HENT: Negative for nosebleeds and sore throat.   Eyes: Negative for double vision.  Respiratory: Positive for shortness of breath. Negative for cough, hemoptysis, sputum production and wheezing.   Cardiovascular: Negative for chest pain, palpitations, orthopnea and leg swelling.  Gastrointestinal: Negative for abdominal pain, blood in stool, constipation, diarrhea, heartburn, melena, nausea and  vomiting.  Genitourinary: Negative for dysuria, frequency and urgency.  Musculoskeletal: Negative for back pain and joint pain.  Skin: Negative.  Negative for itching and rash.  Neurological: Negative for dizziness, tingling, focal weakness, weakness and headaches.  Endo/Heme/Allergies: Does not bruise/bleed easily.  Psychiatric/Behavioral: Negative for depression. The patient is not nervous/anxious and does not have insomnia.     MEDICAL HISTORY:  Past Medical History:  Diagnosis Date  . Atrial fibrillation ([place])   . GAD (generalized anxiety disorder)   . Hypertension   . Prediabetes     SURGICAL HISTORY: Past Surgical History:  Procedure Laterality Date  . CARDIOVERSION N/A 02/24/2020   Procedure: CARDIOVERSION;  Surgeon: Isaias Cowman, MD;  Location: [facility] ORS;  Service: Cardiovascular;  Laterality: N/A;  . supraorbital artery repair  2005  . TOOTH EXTRACTION      SOCIAL HISTORY: Social History   Socioeconomic History  . Marital status: Single    Spouse name: Not on file  . Number of children: Not on file  . Years of education: Not on file  . Highest education level: Not on file  Occupational History  . Not on file  Tobacco Use  . Smoking status: Former Smoker    Types: Cigarettes  . Smokeless tobacco: Former Systems developer    Quit date: 2006  Vaping Use  . Vaping Use: Former  . Quit date: 03/14/2005  Substance and Sexual Activity  . Alcohol use: Not Currently    Comment: quit   . Drug use: Not Currently  . Sexual activity: Not on file  Other Topics Concern  . 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    Social Determinants of Health   Financial Resource Strain:   .  Difficulty of Paying Living Expenses: Not on file  Food Insecurity:   . Worried About Charity fundraiser in the Last Year: Not on file  . Ran Out of Food in the Last Year: Not on file  Transportation Needs:   .  Lack of Transportation (Medical): Not on file  . Lack of Transportation (Non-Medical): Not on file  Physical Activity:   . Days of Exercise per Week: Not on file  . Minutes of Exercise per Session: Not on file  Stress:   . Feeling of Stress : Not on file  Social Connections:   . Frequency of Communication with Friends and Family: Not on file  . Frequency of Social Gatherings with Friends and Family: Not on file  . Attends Religious Services: Not on file  . Active Member of Clubs or Organizations: Not on file  . Attends Archivist Meetings: Not on file  . Marital Status: Not on file  Intimate Partner Violence:   . Fear of Current or Ex-Partner: Not on file  . Emotionally Abused: Not on file  . Physically Abused: Not on file  . Sexually Abused: Not on file    FAMILY HISTORY: Family History  Problem Relation Age of Onset  . Lung cancer Father     ALLERGIES:  is allergic to bupropion, amiodarone, and diltiazem.  MEDICATIONS:  Current Outpatient Medications  Medication Sig Dispense Refill  . ALPRAZolam (XANAX) 0.5 MG tablet Take 0.5 mg by mouth 2 (two) times daily as needed for anxiety.     . digoxin (LANOXIN) 0.125 MG tablet Take by mouth.    . furosemide (LASIX) 20 MG tablet Take 20 mg by mouth daily.    . metoprolol tartrate (LOPRESSOR) 50 MG tablet Take 50 mg by mouth 2 (two) times daily.    Marland Kitchen warfarin (COUMADIN) 3 MG tablet Take 3-4.5 mg by mouth See admin instructions. Take 3 mg on Mon, Wed, Fri, and Sat. Take 4.5 mg on Tues, Thurs, and Sun.    Marland Kitchen eltrombopag (PROMACTA) 50 MG tablet Take 1 tablet (50 mg total) by mouth daily. Take on an empty stomach 1 hour before a meal or 2 hours after (Patient not taking: Reported on 06/06/2020) 30 tablet 3   No current facility-administered medications for this visit.      PHYSICAL EXAMINATION:   Vitals:   08/08/20 1444  BP: 122/62  Pulse: 93  Resp: 16  Temp: (!) 96 F (35.6 C)  SpO2: 97%   Filed Weights    08/08/20 1444  Weight: 153 lb 12.8 oz (69.8 kg)    Physical Exam HENT:     Head: Normocephalic and atraumatic.     Mouth/Throat:     Pharynx: No oropharyngeal exudate.  Eyes:     Pupils: Pupils are equal, round, and reactive to light.  Cardiovascular:     Comments: Irregular irregular rhythm; mild tachycardia; positive for murmur. Pulmonary:     Effort: Pulmonary effort is normal. No respiratory distress.     Breath sounds: Normal breath sounds. No wheezing.  Abdominal:     General: Bowel sounds are normal. There is no distension.     Palpations: Abdomen is soft. There is no mass.     Tenderness: There is no abdominal tenderness. There is no guarding or rebound.  Musculoskeletal:        General: No tenderness. Normal range of motion.     Cervical back: Normal range of motion and neck supple.  Skin:  General: Skin is warm.     Comments: Chronic bruising noted.  Bilateral upper extremities.  Neurological:     Mental Status: He is alert and oriented to person, place, and time.  Psychiatric:        Mood and Affect: Affect normal.     LABORATORY DATA:  I have reviewed the data as listed Lab Results  Component Value Date   WBC 8.3 08/08/2020   HGB 9.2 (L) 08/08/2020   HCT 27.9 (L) 08/08/2020   MCV 97.2 08/08/2020   PLT 103 (L) 08/08/2020   Recent Labs    08/23/19 1239 10/04/19 1343 12/13/19 1412 12/27/19 1425 02/22/20 1501  NA 137   < > 139 135 140  K 4.6   < > 4.3 4.6 4.1  CL 106   < > 102 99 102  CO2 25   < > '27 25 28  ' GLUCOSE 95   < > 99 130* 124*  BUN 32*   < > 48* 51* 27*  CREATININE 1.72*   < > 1.77* 1.61* 2.35*  CALCIUM 9.1   < > 9.1 8.9 9.0  GFRNONAA 37*   < > 36* 40* 26*  GFRAA 43*   < > 42* 47* 30*  PROT 8.3*  --   --   --   --   ALBUMIN 4.4  --   --   --   --   AST 18  --   --   --   --   ALT 14  --   --   --   --   ALKPHOS 56  --   --   --   --   BILITOT 1.1  --   --   --   --    < > = values in this interval not displayed.     No results  found. Anemia of chronic kidney failure, stage 3 (moderate) #Anemia secondary to CKD/mild iron deficiency-on Retacrit; status post IV Venofer-hemoglobin today is 9.2. Proceed with Retacrit today.  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 Will check with pharmacy.   #  A. fib on Coumadin;s/p [Dr.Paraschoes] s/p cardioversion. INR-2.6 KC; STABLE.   # CKD stage-III-IV- follow up with PCP.   # DISPOSITION:  # RETACRIT today # iin 2 months- MD; cbc/bmp/-possible retacrit/ -  Dr.B   All questions were answered. The patient knows to call the clinic with any problems, questions or concerns.    GCammie Sickle MD 08/08/2020 8:54 PM

## 2020-08-08 NOTE — Assessment & Plan Note (Addendum)
#  Anemia secondary to CKD/mild iron deficiency-on Retacrit; status post IV Venofer-hemoglobin today is 9.2. Proceed with Retacrit today.  #Chronic ITP- [on Coumadin]-s/p Promacta 50mg  QOD [ran out in sep 2021] platelets-101; re-start promacta. Will check with pharmacy.   #  A. fib on Coumadin;s/p [Dr.Paraschoes] s/p cardioversion. INR-2.6 KC; STABLE.   # CKD stage-III-IV- follow up with PCP.   # DISPOSITION:  # RETACRIT today # iin 2 months- MD; cbc/bmp/-possible retacrit/ -  Dr.B

## 2020-08-08 NOTE — Progress Notes (Signed)
States he promacta was supposed to be checked on at last visit and he has not heard anything else about it. Has not been taking the medication at all.

## 2020-08-15 NOTE — Telephone Encounter (Signed)
Oral Oncology Patient Advocate Encounter  MD portion completed 08/15/20.  The renewal application has been completed and faxed in an effort to keep the patient's out of pocket expense for Promacta at $0.     Application completed and faxed to [phone].    [employer] patient assistance phone number for follow up is [phone].    This encounter will be updated until final determination.  [place] [facility] [place] Phone [phone] Fax [phone] 08/15/2020 2:58 PM

## 2020-09-25 ENCOUNTER — Other Ambulatory Visit
Admission: RE | Admit: 2020-09-25 | Discharge: 2020-09-25 | Disposition: A | Discharge status: Home / Self Care | Payer: Medicare Other | Source: Ambulatory Visit | Attending: Specialist | Admitting: Specialist

## 2020-09-25 ENCOUNTER — Other Ambulatory Visit: Payer: Self-pay

## 2020-09-25 ENCOUNTER — Other Ambulatory Visit: Payer: Self-pay | Admitting: Specialist

## 2020-09-25 DIAGNOSIS — Z01812 Encounter for preprocedural laboratory examination: Secondary | ICD-10-CM | POA: Diagnosis present

## 2020-09-25 DIAGNOSIS — Z20822 Contact with and (suspected) exposure to covid-19: Secondary | ICD-10-CM | POA: Diagnosis not present

## 2020-09-25 DIAGNOSIS — J90 Pleural effusion, not elsewhere classified: Secondary | ICD-10-CM

## 2020-09-25 NOTE — Telephone Encounter (Addendum)
Oral Oncology Patient Advocate Encounter  Received notification from [employer] Patient Assistance program that patient has been successfully enrolled into their program to receive Promacta from the manufacturer at $0 out of pocket until 09/01/21.    I called and had to leave a voicemail for Samuel Mcknight.  Specialty Pharmacy that will dispense medication is [facility].  Patient knows to call the office with questions or concerns.   Oral Oncology Clinic will continue to follow.  [place] Patient [place] Phone [phone] Fax [phone] 09/25/2020 2:55 PM

## 2020-09-26 ENCOUNTER — Other Ambulatory Visit: Payer: Self-pay | Admitting: Student

## 2020-09-26 ENCOUNTER — Ambulatory Visit
Admission: RE | Admit: 2020-09-26 | Discharge: 2020-09-26 | Disposition: A | Discharge status: Home / Self Care | Payer: Medicare Other | Source: Ambulatory Visit | Attending: Student | Admitting: Student

## 2020-09-26 ENCOUNTER — Other Ambulatory Visit: Payer: Self-pay

## 2020-09-26 ENCOUNTER — Ambulatory Visit
Admission: RE | Admit: 2020-09-26 | Discharge: 2020-09-26 | Disposition: A | Discharge status: Home / Self Care | Payer: Medicare Other | Source: Ambulatory Visit | Attending: Specialist | Admitting: Specialist

## 2020-09-26 DIAGNOSIS — J90 Pleural effusion, not elsewhere classified: Secondary | ICD-10-CM | POA: Diagnosis not present

## 2020-09-26 DIAGNOSIS — J449 Chronic obstructive pulmonary disease, unspecified: Secondary | ICD-10-CM | POA: Diagnosis not present

## 2020-09-26 DIAGNOSIS — I4891 Unspecified atrial fibrillation: Secondary | ICD-10-CM | POA: Diagnosis not present

## 2020-09-26 DIAGNOSIS — Z9889 Other specified postprocedural states: Secondary | ICD-10-CM

## 2020-09-26 DIAGNOSIS — Z8679 Personal history of other diseases of the circulatory system: Secondary | ICD-10-CM | POA: Diagnosis not present

## 2020-09-26 LAB — LACTATE DEHYDROGENASE, PLEURAL OR PERITONEAL FLUID: LD, Fluid: 97 U/L — ABNORMAL HIGH (ref 3–23)

## 2020-09-26 LAB — BODY FLUID CELL COUNT WITH DIFFERENTIAL
Eos, Fluid: 11 %
Lymphs, Fluid: 70 %
Monocyte-Macrophage-Serous Fluid: 13 %
Neutrophil Count, Fluid: 4 %
Total Nucleated Cell Count, Fluid: 368 cu mm

## 2020-09-26 LAB — PROTEIN, PLEURAL OR PERITONEAL FLUID: Total protein, fluid: <3 g/dL

## 2020-09-26 LAB — SARS CORONAVIRUS 2 (TAT 6-24 HRS): SARS Coronavirus 2: NEGATIVE

## 2020-09-26 LAB — GLUCOSE, PLEURAL OR PERITONEAL FLUID: Glucose, Fluid: 107 mg/dL

## 2020-09-26 LAB — AMYLASE, PLEURAL OR PERITONEAL FLUID: Amylase, Fluid: 25 U/L

## 2020-09-26 NOTE — Procedures (Signed)
PROCEDURE SUMMARY:  Successful US guided right thoracentesis. Yielded 1800 ml of amber-colored fluid. Pt tolerated procedure well. No immediate complications.  Specimen sent for labs. CXR ordered; no post-procedure pneumothorax identified  EBL < 2 mL  Theresa Duty, NP 09/26/2020 2:37 PM

## 2020-09-27 LAB — ACID FAST SMEAR (AFB, MYCOBACTERIA): Acid Fast Smear: NEGATIVE

## 2020-09-27 LAB — PROTEIN, BODY FLUID (OTHER): Total Protein, Body Fluid Other: 3 g/dL

## 2020-09-28 LAB — PH, BODY FLUID: pH, Body Fluid: 7.5

## 2020-09-28 LAB — CYTOLOGY - NON PAP

## 2020-09-29 LAB — CHOLESTEROL, BODY FLUID: Cholesterol, Fluid: 28 mg/dL

## 2020-09-30 LAB — BODY FLUID CULTURE: Culture: NO GROWTH

## 2020-10-06 ENCOUNTER — Other Ambulatory Visit: Payer: Self-pay

## 2020-10-06 ENCOUNTER — Other Ambulatory Visit: Payer: Self-pay | Admitting: Specialist

## 2020-10-06 ENCOUNTER — Other Ambulatory Visit
Admission: RE | Admit: 2020-10-06 | Discharge: 2020-10-06 | Disposition: A | Discharge status: Home / Self Care | Payer: Medicare Other | Source: Ambulatory Visit | Attending: Specialist | Admitting: Specialist

## 2020-10-06 DIAGNOSIS — J90 Pleural effusion, not elsewhere classified: Secondary | ICD-10-CM

## 2020-10-06 DIAGNOSIS — Z01812 Encounter for preprocedural laboratory examination: Secondary | ICD-10-CM | POA: Diagnosis present

## 2020-10-06 DIAGNOSIS — Z20822 Contact with and (suspected) exposure to covid-19: Secondary | ICD-10-CM | POA: Diagnosis not present

## 2020-10-06 DIAGNOSIS — R0600 Dyspnea, unspecified: Secondary | ICD-10-CM

## 2020-10-06 DIAGNOSIS — R0609 Other forms of dyspnea: Secondary | ICD-10-CM

## 2020-10-06 LAB — SARS CORONAVIRUS 2 (TAT 6-24 HRS): SARS Coronavirus 2: NEGATIVE

## 2020-10-09 ENCOUNTER — Ambulatory Visit
Admission: RE | Admit: 2020-10-09 | Discharge: 2020-10-09 | Disposition: A | Discharge status: Home / Self Care | Payer: Medicare Other | Source: Ambulatory Visit | Attending: Interventional Radiology | Admitting: Interventional Radiology

## 2020-10-09 ENCOUNTER — Other Ambulatory Visit: Payer: Self-pay

## 2020-10-09 ENCOUNTER — Ambulatory Visit
Admission: RE | Admit: 2020-10-09 | Discharge: 2020-10-09 | Disposition: A | Discharge status: Home / Self Care | Payer: Medicare Other | Source: Ambulatory Visit | Attending: Specialist | Admitting: Specialist

## 2020-10-09 ENCOUNTER — Other Ambulatory Visit: Payer: Self-pay | Admitting: Interventional Radiology

## 2020-10-09 DIAGNOSIS — J90 Pleural effusion, not elsewhere classified: Secondary | ICD-10-CM

## 2020-10-09 DIAGNOSIS — I700 Atherosclerosis of aorta: Secondary | ICD-10-CM | POA: Diagnosis not present

## 2020-10-09 DIAGNOSIS — Z9889 Other specified postprocedural states: Secondary | ICD-10-CM

## 2020-10-09 DIAGNOSIS — R0609 Other forms of dyspnea: Secondary | ICD-10-CM

## 2020-10-09 DIAGNOSIS — R0600 Dyspnea, unspecified: Secondary | ICD-10-CM

## 2020-10-09 LAB — BODY FLUID CELL COUNT WITH DIFFERENTIAL
Eos, Fluid: 8 %
Lymphs, Fluid: 73 %
Monocyte-Macrophage-Serous Fluid: 9 %
Neutrophil Count, Fluid: 7 %
Other Cells, Fluid: 3 %
Total Nucleated Cell Count, Fluid: 1328 cu mm

## 2020-10-09 LAB — AMYLASE, PLEURAL OR PERITONEAL FLUID: Amylase, Fluid: 23 U/L

## 2020-10-09 LAB — PROTEIN, PLEURAL OR PERITONEAL FLUID: Total protein, fluid: <3 g/dL

## 2020-10-09 LAB — LACTATE DEHYDROGENASE, PLEURAL OR PERITONEAL FLUID: LD, Fluid: 132 U/L — ABNORMAL HIGH (ref 3–23)

## 2020-10-09 LAB — GLUCOSE, PLEURAL OR PERITONEAL FLUID: Glucose, Fluid: 106 mg/dL

## 2020-10-09 NOTE — Procedures (Signed)
Interventional Radiology Procedure Note  Procedure: Thoracentesis  Indication: Recurrent right thoracentesis.  Findings: Please refer to procedural dictation for full description.  Complications: None  EBL: < 10 mL  Miachel Roux, MD [phone]

## 2020-10-10 ENCOUNTER — Inpatient Hospital Stay: Payer: Medicare Other | Attending: Internal Medicine

## 2020-10-10 ENCOUNTER — Other Ambulatory Visit: Payer: Self-pay | Admitting: *Deleted

## 2020-10-10 ENCOUNTER — Inpatient Hospital Stay (HOSPITAL_BASED_OUTPATIENT_CLINIC_OR_DEPARTMENT_OTHER): Payer: Medicare Other | Admitting: Internal Medicine

## 2020-10-10 ENCOUNTER — Inpatient Hospital Stay: Payer: Medicare Other

## 2020-10-10 ENCOUNTER — Other Ambulatory Visit: Payer: Self-pay

## 2020-10-10 DIAGNOSIS — D631 Anemia in chronic kidney disease: Secondary | ICD-10-CM

## 2020-10-10 DIAGNOSIS — N183 Chronic kidney disease, stage 3 unspecified: Secondary | ICD-10-CM

## 2020-10-10 DIAGNOSIS — D693 Immune thrombocytopenic purpura: Secondary | ICD-10-CM | POA: Diagnosis not present

## 2020-10-10 LAB — CBC WITH DIFFERENTIAL/PLATELET
Abs Immature Granulocytes: 0.02 10*3/uL (ref 0.00–0.07)
Basophils Absolute: 0.1 10*3/uL (ref 0.0–0.1)
Basophils Relative: 1 %
Eosinophils Absolute: 0.3 10*3/uL (ref 0.0–0.5)
Eosinophils Relative: 4 %
HCT: 25.7 % — ABNORMAL LOW (ref 39.0–52.0)
Hemoglobin: 8.5 g/dL — ABNORMAL LOW (ref 13.0–17.0)
Immature Granulocytes: 0 %
Lymphocytes Relative: 15 %
Lymphs Abs: 1.1 10*3/uL (ref 0.7–4.0)
MCH: 32.2 pg (ref 26.0–34.0)
MCHC: 33.1 g/dL (ref 30.0–36.0)
MCV: 97.3 fL (ref 80.0–100.0)
Monocytes Absolute: 1.2 10*3/uL — ABNORMAL HIGH (ref 0.1–1.0)
Monocytes Relative: 16 %
Neutro Abs: 4.9 10*3/uL (ref 1.7–7.7)
Neutrophils Relative %: 64 %
Platelets: 130 10*3/uL — ABNORMAL LOW (ref 150–400)
RBC: 2.64 MIL/uL — ABNORMAL LOW (ref 4.22–5.81)
RDW: 17.3 % — ABNORMAL HIGH (ref 11.5–15.5)
WBC: 7.7 10*3/uL (ref 4.0–10.5)
nRBC: 0 % (ref 0.0–0.2)

## 2020-10-10 LAB — ACID FAST SMEAR (AFB, MYCOBACTERIA): Acid Fast Smear: NEGATIVE

## 2020-10-10 LAB — BASIC METABOLIC PANEL
Anion gap: 8 (ref 5–15)
BUN: 31 mg/dL — ABNORMAL HIGH (ref 8–23)
CO2: 26 mmol/L (ref 22–32)
Calcium: 8.9 mg/dL (ref 8.9–10.3)
Chloride: 101 mmol/L (ref 98–111)
Creatinine, Ser: 1.76 mg/dL — ABNORMAL HIGH (ref 0.61–1.24)
GFR, Estimated: 39 mL/min — ABNORMAL LOW (ref >60)
Glucose, Bld: 116 mg/dL — ABNORMAL HIGH (ref 70–99)
Potassium: 4.1 mmol/L (ref 3.5–5.1)
Sodium: 135 mmol/L (ref 135–145)

## 2020-10-10 LAB — FERRITIN: Ferritin: 325 ng/mL (ref 24–336)

## 2020-10-10 LAB — IRON AND TIBC
Iron: 51 ug/dL (ref 45–182)
Saturation Ratios: 21 % (ref 17.9–39.5)
TIBC: 242 ug/dL — ABNORMAL LOW (ref 250–450)
UIBC: 191 ug/dL

## 2020-10-10 LAB — PROTEIN, BODY FLUID (OTHER): Total Protein, Body Fluid Other: 3.2 g/dL

## 2020-10-10 MED ORDER — EPOETIN ALFA-EPBX 10000 UNIT/ML IJ SOLN
20000.0000 [IU] | Freq: Once | INTRAMUSCULAR | Status: AC
  Administered 2020-10-10: 20000 [IU] via SUBCUTANEOUS
  Filled 2020-10-10: qty 2

## 2020-10-10 MED ORDER — ELTROMBOPAG OLAMINE 25 MG PO TABS: 25.0000 mg | ORAL_TABLET | Freq: Every day | ORAL | 6 refills | Status: DC

## 2020-10-10 NOTE — Assessment & Plan Note (Addendum)
#  Anemia secondary to CKD/mild iron deficiency-on Retacrit; status post IV Venofer-hemoglobin today is 8.5 Proceed with Retacrit today.  Iron studies -iron saturation 21%.  Recommend Feraheme weekly x3.Marland Kitchen  #Chronic ITP-stable.  [on Coumadin]-s/p Promacta 50mg  QOD [ran out in sep 2021] platelets-101; re-start promacta at 25 mg/day.  Discussed with pharmacy.   #CHF-right pleural effusion needing thoracentesis x2 [Jan 2022]/A. fib on Coumadin;s/p [Dr.Paraschoes/Dr.Fleming] s/p cardioversion. INR-2.6 KC; STABLE.   # MGUS-clinically stable.  Will repeat myeloma panel at next visit.  # CKD stage-III-IV- follow up with PCP.   # DISPOSITION:  # RETACRIT today # ferrahem weekly x3.  # 1 month-cbc-possible retacrit # iin 2 months- MD; cbc/bmp/-possible retacrit/ -  Dr.B

## 2020-10-10 NOTE — Progress Notes (Unsigned)
[place] NOTE  Patient Care Team: Kirk Ruths, MD as PCP - General (Internal Medicine) Cammie Sickle, MD as Consulting Physician (Internal Medicine)  CHIEF COMPLAINTS/PURPOSE OF CONSULTATION: Anemia of chronic kidney disease/thrombocytopenia   HEMATOLOGY HISTORY  # CHRONIC ANEMIA- [at least 2018]- worsening- July 2020- 9/ nomocytic; NO Coloscopy; EGD- [place], 2017; October 2020-bone marrow biopsy-no overt myelodysplasia noted; subtle dyspoietic changes noted/karyotype normal; MDS FISH[neogenomics]- NEG panel; HOLD off NGS.   #Chronic mild thrombocytopenia [120s]-question etiology; ? 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  In the interim patient was evaluated pulmonary, KC for worsening shortness of breath.  Patient noted to have right pleural effusion status post thoracentesis.    Patient stated that he ran out of Promacta about 5 months ago.  Denies any easy bruising or bleeding.  He continues to be on Coumadin.  Patient complains of worsening fatigue.  Otherwise no blood in stools or black or stools.  Review of Systems  Constitutional: Positive for malaise/fatigue. Negative for chills, diaphoresis, fever and weight loss.  HENT: Negative for nosebleeds and sore throat.   Eyes: Negative for double vision.  Respiratory: Positive for shortness of breath. Negative for cough, hemoptysis, sputum production and  wheezing.   Cardiovascular: Negative for chest pain, palpitations, orthopnea and leg swelling.  Gastrointestinal: Negative for abdominal pain, blood in stool, constipation, diarrhea, heartburn, melena, nausea and vomiting.  Genitourinary: Negative for dysuria, frequency and urgency.  Musculoskeletal: Negative for back pain and joint pain.  Skin: Negative.  Negative for itching and rash.  Neurological: Negative for dizziness, tingling, focal weakness, weakness and headaches.  Endo/Heme/Allergies: Does not bruise/bleed easily.  Psychiatric/Behavioral: Negative for depression. The patient is not nervous/anxious and does not have insomnia.     MEDICAL HISTORY:  Past Medical History:  Diagnosis Date  . Atrial fibrillation ([place])   . GAD (generalized anxiety disorder)   . Hypertension   . Prediabetes     SURGICAL HISTORY: Past Surgical History:  Procedure Laterality Date  . CARDIOVERSION N/A 02/24/2020   Procedure: CARDIOVERSION;  Surgeon: Isaias Cowman, MD;  Location: [facility] ORS;  Service: Cardiovascular;  Laterality: N/A;  . supraorbital artery repair  2005  . TOOTH EXTRACTION      SOCIAL HISTORY: Social History   Socioeconomic History  . Marital status: Single    Spouse name: Not on file  . Number of children: Not on file  . Years of education: Not on file  . Highest education level: Not on file  Occupational History  . Not on file  Tobacco Use  . Smoking status: Former Smoker    Types: Cigarettes  . Smokeless tobacco: Former Systems developer    Quit date: 2006  Vaping Use  . Vaping Use: Former  . Quit date: 03/14/2005  Substance and Sexual Activity  . Alcohol use: Not Currently    Comment: quit   . Drug use: Not Currently  . Sexual activity: Not on file  Other Topics Concern  . 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    Social Determinants of Health   Financial Resource Strain: Not on  file  Food Insecurity: Not on file  Transportation Needs: Not on file  Physical Activity: Not on file  Stress: Not on file  Social Connections: Not on file  Intimate Partner Violence: Not on file    FAMILY HISTORY: Family History  Problem Relation Age of Onset  . Lung cancer Father     ALLERGIES:  is allergic to bupropion, amiodarone, and diltiazem.  MEDICATIONS:  Current Outpatient Medications  Medication Sig Dispense Refill  . ALPRAZolam (XANAX) 0.5 MG tablet Take 0.5 mg by mouth 2 (two) times daily as needed for anxiety.     . digoxin (LANOXIN) 0.125 MG tablet Take by mouth.    . furosemide (LASIX) 20 MG tablet Take 20 mg by mouth daily.    . metoprolol tartrate (LOPRESSOR) 50 MG tablet Take 50 mg by mouth 2 (two) times daily.    Marland Kitchen warfarin (COUMADIN) 3 MG tablet Take 3-4.5 mg by mouth See admin instructions. Take 3 mg on Mon, Wed, Fri, and Sat. Take 4.5 mg on Tues, Thurs, and Sun.    Marland Kitchen eltrombopag (PROMACTA) 25 MG tablet Take 1 tablet (25 mg total) by mouth daily. Take on an empty stomach 1 hour before a meal or 2 hours after 30 tablet 6   No current facility-administered medications for this visit.      PHYSICAL EXAMINATION:   Vitals:   10/10/20 1433  BP: 118/75  Pulse: 89  Resp: 16  Temp: (!) 97.5 F (36.4 C)  SpO2: 97%   Filed Weights   10/10/20 1433  Weight: 147 lb 12.8 oz (67 kg)    Physical Exam HENT:     Head: Normocephalic and atraumatic.     Mouth/Throat:     Pharynx: No oropharyngeal exudate.  Eyes:     Pupils: Pupils are equal, round, and reactive to light.  Cardiovascular:     Comments: Irregular irregular rhythm; mild tachycardia; positive for murmur. Pulmonary:     Effort: Pulmonary effort is normal. No respiratory distress.     Breath sounds: Normal breath sounds. No wheezing.  Abdominal:     General: Bowel sounds are normal. There is no distension.     Palpations: Abdomen is soft. There is no mass.     Tenderness: There is no  abdominal tenderness. There is no guarding or rebound.  Musculoskeletal:        General: No tenderness. Normal range of motion.     Cervical back: Normal range of motion and neck supple.  Skin:    General: Skin is warm.     Comments: Chronic bruising noted.  Bilateral upper extremities.  Neurological:     Mental Status: He is alert and oriented to person, place, and time.  Psychiatric:        Mood and Affect: Affect normal.     LABORATORY DATA:  I have reviewed the data as listed Lab Results  Component Value Date   WBC 7.7 10/10/2020   HGB 8.5 (L) 10/10/2020   HCT 25.7 (L) 10/10/2020   MCV 97.3 10/10/2020   PLT 130 (L) 10/10/2020   Recent Labs    12/13/19 1412 12/27/19 1425 02/22/20 1501 10/10/20 1414  NA 139 135 140 135  K 4.3 4.6 4.1 4.1  CL 102 99 102 101  CO2 _0 GLUCOSE 99 130*  124* 116*  BUN 48* 51* 27* 31*  CREATININE 1.77* 1.61* 2.35* 1.76*  CALCIUM 9.1 8.9 9.0 8.9  GFRNONAA 36* 40* 26* 39*  GFRAA 42* 47* 30*  --      DG Chest Port 1 View  Result Date: 10/09/2020 CLINICAL DATA:  Status post thoracentesis EXAM: PORTABLE CHEST 1 VIEW COMPARISON:  September 26, 2020 FINDINGS: No pneumothorax evident. Small right pleural effusion. Areas of persistent patchy airspace opacity bilaterally. There is cardiomegaly, stable, with pulmonary vascularity normal. No adenopathy. There is aortic atherosclerosis. No bone lesions IMPRESSION: No pneumothorax. Small right pleural effusion. Areas of ill-defined patchy opacity bilaterally, potentially representing scattered foci of pneumonia. Appearance similar to recent prior study. No consolidation. Stable cardiac prominence. Aortic Atherosclerosis (ICD10-I70.0). Electronically Signed   By: Lowella Grip III M.D.   On: 10/09/2020 13:57   DG Chest Port 1 View  Result Date: 09/26/2020 CLINICAL DATA:  Post RIGHT thoracentesis. EXAM: PORTABLE CHEST 1 VIEW COMPARISON:  Report from decubitus view of the chest from January of  2022 FINDINGS: Cardiomediastinal contours with suggestion of cardiac enlargement, accentuated by portable technique and AP projection. Subtle opacities in the LEFT chest. Nodular opacities suspected in the lingula. No visible pneumothorax following RIGHT thoracentesis. Blunting of the RIGHT costodiaphragmatic sulcus and to a similar extent the LEFT costodiaphragmatic sulcus. Biapical pleural and parenchymal scarring. On limited assessment no acute skeletal process. IMPRESSION: 1. No visible pneumothorax following RIGHT thoracentesis. 2. Nodular opacities in the LEFT chest, suggest CT follow-up for further evaluation. 3. Trace effusions versus is pleural scarring at the lung bases. 4. Cardiomegaly. These results were called by telephone at the time of interpretation on 09/26/2020 at 2:20 pm to provider [facility] , who verbally acknowledged these results. Electronically Signed   By: Zetta Bills M.D.   On: 09/26/2020 14:21   US THORACENTESIS ASP PLEURAL SPACE W/IMG GUIDE  Result Date: 10/09/2020 INDICATION: Recurrence of right pleural effusion EXAM: ULTRASOUND GUIDED RIGHT THORACENTESIS MEDICATIONS: None. COMPLICATIONS: None immediate. PROCEDURE: An ultrasound guided thoracentesis was thoroughly discussed with the patient and questions answered. The benefits, risks, alternatives and complications were also discussed. The patient understands and wishes to proceed with the procedure. Written consent was obtained. Ultrasound was performed to localize and mark an adequate pocket of fluid in the right chest. The area was then prepped and draped in the normal sterile fashion. 1% Lidocaine was used for local anesthesia. Under ultrasound guidance a 6 Fr Safe-T-Centesis catheter was introduced. Thoracentesis was performed. The catheter was removed and a dressing applied. FINDINGS: A total of approximately 1.75 L of straw-colored fluid was removed. Samples were sent to the laboratory as requested by the clinical team.  IMPRESSION: Successful ultrasound guided right thoracentesis yielding 1.75 L of pleural fluid. Electronically Signed   By: Miachel Roux M.D.   On: 10/09/2020 14:28   US THORACENTESIS ASP PLEURAL SPACE W/IMG GUIDE  Result Date: 09/26/2020 INDICATION: Patient with a history of heart failure, atrial fibrillation and COPD presents today with new onset right-sided pleural effusion. Interventional radiology asked to perform a therapeutic and diagnostic thoracentesis. EXAM: ULTRASOUND GUIDED THORACENTESIS MEDICATIONS: 1% lidocaine 10 mL COMPLICATIONS: None immediate. PROCEDURE: An ultrasound guided thoracentesis was thoroughly discussed with the patient and questions answered. The benefits, risks, alternatives and complications were also discussed. The patient understands and wishes to proceed with the procedure. Written consent was obtained. Ultrasound was performed to localize and mark an adequate pocket of fluid in the right chest. The area was then prepped and  draped in the normal sterile fashion. 1% Lidocaine was used for local anesthesia. Under ultrasound guidance a 6 Fr Safe-T-Centesis catheter was introduced. Thoracentesis was performed. The catheter was removed and a dressing applied. FINDINGS: A total of approximately 1.8 L of amber-colored fluid was removed. Samples were sent to the laboratory as requested by the clinical team. IMPRESSION: Successful ultrasound guided right thoracentesis yielding 1.8 L of pleural fluid. Read by: Soyla Dryer, NP Electronically Signed   By: Miachel Roux M.D.   On: 09/26/2020 14:27   Anemia of chronic kidney failure, stage 3 (moderate) #Anemia secondary to CKD/mild iron deficiency-on Retacrit; status post IV Venofer-hemoglobin today is 8.5 Proceed with Retacrit today.  Iron studies -iron saturation 21%.  Recommend Feraheme weekly x3.Marland Kitchen  #Chronic ITP-stable.  [on Coumadin]-s/p Promacta 94m QOD [ran out in sep 2021] platelets-101; re-start promacta at 25 mg/day.   Discussed with pharmacy.   #CHF-right pleural effusion needing thoracentesis x2 [Jan 2022]/A. fib on Coumadin;s/p [Dr.Paraschoes/Dr.Fleming] s/p cardioversion. INR-2.6 KC; STABLE.   # MGUS-clinically stable.  Will repeat myeloma panel at next visit.  # CKD stage-III-IV- follow up with PCP.   # DISPOSITION:  # RETACRIT today # ferrahem weekly x3.  # 1 month-cbc-possible retacrit # iin 2 months- MD; cbc/bmp/-possible retacrit/ -  Dr.B   All questions were answered. The patient knows to call the clinic with any problems, questions or concerns.    GCammie Sickle MD 10/11/2020 7:30 AM

## 2020-10-11 LAB — CYTOLOGY - NON PAP

## 2020-10-12 LAB — CHOLESTEROL, BODY FLUID: Cholesterol, Fluid: 27 mg/dL

## 2020-10-13 LAB — BODY FLUID CULTURE
Culture: NO GROWTH
Gram Stain: NONE SEEN

## 2020-10-17 ENCOUNTER — Telehealth: Payer: Self-pay | Admitting: *Deleted

## 2020-10-17 NOTE — Telephone Encounter (Signed)
Patient called and requested a call back to change his apts to a different date. Colette contacted patient and r/s his apt per his request.

## 2020-10-18 ENCOUNTER — Inpatient Hospital Stay: Payer: Medicare Other

## 2020-10-18 VITALS — BP 115/69 | HR 89 | Temp 96.9°F | Resp 16

## 2020-10-18 DIAGNOSIS — N183 Chronic kidney disease, stage 3 unspecified: Secondary | ICD-10-CM | POA: Diagnosis not present

## 2020-10-18 DIAGNOSIS — D631 Anemia in chronic kidney disease: Secondary | ICD-10-CM

## 2020-10-18 MED ORDER — EPOETIN ALFA-EPBX 40000 UNIT/ML IJ SOLN: 20000.0000 [IU] | Freq: Once | INTRAMUSCULAR | Status: DC

## 2020-10-18 MED ORDER — SODIUM CHLORIDE 0.9 % IV SOLN
510.0000 mg | Freq: Once | INTRAVENOUS | Status: AC
  Administered 2020-10-18: 510 mg via INTRAVENOUS
  Filled 2020-10-18: qty 17

## 2020-10-18 MED ORDER — SODIUM CHLORIDE 0.9 % IV SOLN
Freq: Once | INTRAVENOUS | Status: AC
  Filled 2020-10-18: qty 250

## 2020-10-18 MED ORDER — ACETAMINOPHEN 325 MG PO TABS
650.0000 mg | ORAL_TABLET | Freq: Once | ORAL | Status: AC
  Administered 2020-10-18: 650 mg via ORAL
  Filled 2020-10-18: qty 2

## 2020-10-25 ENCOUNTER — Other Ambulatory Visit: Payer: Self-pay | Admitting: Specialist

## 2020-10-25 ENCOUNTER — Ambulatory Visit: Payer: Medicare Other

## 2020-10-25 DIAGNOSIS — J90 Pleural effusion, not elsewhere classified: Secondary | ICD-10-CM

## 2020-10-26 ENCOUNTER — Inpatient Hospital Stay: Payer: Medicare Other

## 2020-10-26 ENCOUNTER — Other Ambulatory Visit: Payer: Self-pay

## 2020-10-26 ENCOUNTER — Other Ambulatory Visit
Admission: RE | Admit: 2020-10-26 | Discharge: 2020-10-26 | Disposition: A | Discharge status: Home / Self Care | Payer: Medicare Other | Source: Ambulatory Visit | Attending: Specialist | Admitting: Specialist

## 2020-10-26 VITALS — BP 104/62 | HR 53 | Temp 97.8°F | Resp 20

## 2020-10-26 DIAGNOSIS — Z20822 Contact with and (suspected) exposure to covid-19: Secondary | ICD-10-CM | POA: Diagnosis not present

## 2020-10-26 DIAGNOSIS — Z01812 Encounter for preprocedural laboratory examination: Secondary | ICD-10-CM | POA: Insufficient documentation

## 2020-10-26 DIAGNOSIS — N183 Chronic kidney disease, stage 3 unspecified: Secondary | ICD-10-CM | POA: Diagnosis not present

## 2020-10-26 DIAGNOSIS — D631 Anemia in chronic kidney disease: Secondary | ICD-10-CM

## 2020-10-26 LAB — SARS CORONAVIRUS 2 (TAT 6-24 HRS): SARS Coronavirus 2: NEGATIVE

## 2020-10-26 MED ORDER — SODIUM CHLORIDE 0.9 % IV SOLN
Freq: Once | INTRAVENOUS | Status: AC
  Filled 2020-10-26: qty 250

## 2020-10-26 MED ORDER — EPOETIN ALFA-EPBX 40000 UNIT/ML IJ SOLN: 20000.0000 [IU] | Freq: Once | INTRAMUSCULAR | Status: DC

## 2020-10-26 MED ORDER — ACETAMINOPHEN 325 MG PO TABS
650.0000 mg | ORAL_TABLET | Freq: Once | ORAL | Status: AC
  Administered 2020-10-26: 650 mg via ORAL
  Filled 2020-10-26: qty 2

## 2020-10-26 MED ORDER — SODIUM CHLORIDE 0.9 % IV SOLN
510.0000 mg | Freq: Once | INTRAVENOUS | Status: AC
  Administered 2020-10-26: 510 mg via INTRAVENOUS
  Filled 2020-10-26: qty 510

## 2020-10-27 ENCOUNTER — Other Ambulatory Visit: Payer: Self-pay

## 2020-10-27 ENCOUNTER — Ambulatory Visit
Admission: RE | Admit: 2020-10-27 | Discharge: 2020-10-27 | Disposition: A | Discharge status: Home / Self Care | Payer: Medicare Other | Source: Ambulatory Visit | Attending: Specialist | Admitting: Specialist

## 2020-10-27 ENCOUNTER — Other Ambulatory Visit: Payer: Self-pay | Admitting: Specialist

## 2020-10-27 DIAGNOSIS — J449 Chronic obstructive pulmonary disease, unspecified: Secondary | ICD-10-CM | POA: Insufficient documentation

## 2020-10-27 DIAGNOSIS — J90 Pleural effusion, not elsewhere classified: Secondary | ICD-10-CM

## 2020-10-27 DIAGNOSIS — Z9889 Other specified postprocedural states: Secondary | ICD-10-CM | POA: Diagnosis not present

## 2020-10-27 DIAGNOSIS — I509 Heart failure, unspecified: Secondary | ICD-10-CM | POA: Diagnosis not present

## 2020-10-27 LAB — FUNGAL ORGANISM REFLEX

## 2020-10-27 LAB — FUNGUS CULTURE WITH STAIN

## 2020-10-27 LAB — BODY FLUID CELL COUNT WITH DIFFERENTIAL
Eos, Fluid: 0 %
Lymphs, Fluid: 54 %
Monocyte-Macrophage-Serous Fluid: 35 %
Neutrophil Count, Fluid: 9 %
Other Cells, Fluid: 2 %
Total Nucleated Cell Count, Fluid: 697 cu mm

## 2020-10-27 LAB — FUNGUS CULTURE RESULT

## 2020-10-27 NOTE — Procedures (Signed)
PROCEDURE SUMMARY:  Successful US guided right thoracentesis. Yielded 1.75 L of clear amber fluid. Pt tolerated procedure well. No immediate complications.  Specimen was sent for labs. CXR ordered.  EBL < 5 mL  Ascencion Dike PA-C 10/27/2020 3:00 PM

## 2020-10-31 LAB — CYTOLOGY - NON PAP

## 2020-11-01 ENCOUNTER — Inpatient Hospital Stay: Payer: Medicare Other | Attending: Internal Medicine

## 2020-11-01 VITALS — BP 95/53 | HR 67 | Temp 96.7°F | Resp 20

## 2020-11-01 DIAGNOSIS — D631 Anemia in chronic kidney disease: Secondary | ICD-10-CM | POA: Diagnosis present

## 2020-11-01 DIAGNOSIS — N183 Chronic kidney disease, stage 3 unspecified: Secondary | ICD-10-CM | POA: Insufficient documentation

## 2020-11-01 MED ORDER — SODIUM CHLORIDE 0.9 % IV SOLN
510.0000 mg | Freq: Once | INTRAVENOUS | Status: AC
  Administered 2020-11-01: 510 mg via INTRAVENOUS
  Filled 2020-11-01: qty 17

## 2020-11-01 MED ORDER — SODIUM CHLORIDE 0.9 % IV SOLN
Freq: Once | INTRAVENOUS | Status: AC
  Filled 2020-11-01: qty 250

## 2020-11-02 ENCOUNTER — Ambulatory Visit: Payer: Medicare Other

## 2020-11-07 ENCOUNTER — Other Ambulatory Visit: Payer: Self-pay

## 2020-11-07 ENCOUNTER — Inpatient Hospital Stay: Payer: Medicare Other

## 2020-11-07 ENCOUNTER — Ambulatory Visit
Admission: RE | Admit: 2020-11-07 | Discharge: 2020-11-07 | Disposition: A | Discharge status: Home / Self Care | Payer: Medicare Other | Source: Ambulatory Visit | Attending: Specialist | Admitting: Specialist

## 2020-11-07 VITALS — BP 103/67 | HR 96

## 2020-11-07 DIAGNOSIS — J90 Pleural effusion, not elsewhere classified: Secondary | ICD-10-CM | POA: Diagnosis present

## 2020-11-07 DIAGNOSIS — N183 Chronic kidney disease, stage 3 unspecified: Secondary | ICD-10-CM | POA: Diagnosis not present

## 2020-11-07 DIAGNOSIS — D631 Anemia in chronic kidney disease: Secondary | ICD-10-CM

## 2020-11-07 DIAGNOSIS — D693 Immune thrombocytopenic purpura: Secondary | ICD-10-CM

## 2020-11-07 LAB — CBC WITH DIFFERENTIAL/PLATELET
Abs Immature Granulocytes: 0.02 10*3/uL (ref 0.00–0.07)
Basophils Absolute: 0.1 10*3/uL (ref 0.0–0.1)
Basophils Relative: 1 %
Eosinophils Absolute: 0.2 10*3/uL (ref 0.0–0.5)
Eosinophils Relative: 3 %
HCT: 26.2 % — ABNORMAL LOW (ref 39.0–52.0)
Hemoglobin: 8.4 g/dL — ABNORMAL LOW (ref 13.0–17.0)
Immature Granulocytes: 0 %
Lymphocytes Relative: 15 %
Lymphs Abs: 1 10*3/uL (ref 0.7–4.0)
MCH: 32.3 pg (ref 26.0–34.0)
MCHC: 32.1 g/dL (ref 30.0–36.0)
MCV: 100.8 fL — ABNORMAL HIGH (ref 80.0–100.0)
Monocytes Absolute: 1.1 10*3/uL — ABNORMAL HIGH (ref 0.1–1.0)
Monocytes Relative: 16 %
Neutro Abs: 4.3 10*3/uL (ref 1.7–7.7)
Neutrophils Relative %: 65 %
Platelets: 124 10*3/uL — ABNORMAL LOW (ref 150–400)
RBC: 2.6 MIL/uL — ABNORMAL LOW (ref 4.22–5.81)
RDW: 19.1 % — ABNORMAL HIGH (ref 11.5–15.5)
WBC: 6.7 10*3/uL (ref 4.0–10.5)
nRBC: 0 % (ref 0.0–0.2)

## 2020-11-07 LAB — BASIC METABOLIC PANEL
Anion gap: 10 (ref 5–15)
BUN: 33 mg/dL — ABNORMAL HIGH (ref 8–23)
CO2: 25 mmol/L (ref 22–32)
Calcium: 8.8 mg/dL — ABNORMAL LOW (ref 8.9–10.3)
Chloride: 102 mmol/L (ref 98–111)
Creatinine, Ser: 1.66 mg/dL — ABNORMAL HIGH (ref 0.61–1.24)
GFR, Estimated: 42 mL/min — ABNORMAL LOW (ref >60)
Glucose, Bld: 107 mg/dL — ABNORMAL HIGH (ref 70–99)
Potassium: 4.2 mmol/L (ref 3.5–5.1)
Sodium: 137 mmol/L (ref 135–145)

## 2020-11-07 MED ORDER — EPOETIN ALFA-EPBX 10000 UNIT/ML IJ SOLN
20000.0000 [IU] | Freq: Once | INTRAMUSCULAR | Status: AC
  Administered 2020-11-07: 20000 [IU] via SUBCUTANEOUS
  Filled 2020-11-07: qty 2

## 2020-11-09 LAB — ACID FAST CULTURE WITH REFLEXED SENSITIVITIES (MYCOBACTERIA): Acid Fast Culture: NEGATIVE

## 2020-11-13 LAB — FUNGUS CULTURE WITH STAIN

## 2020-11-13 LAB — FUNGAL ORGANISM REFLEX

## 2020-11-13 LAB — FUNGUS CULTURE RESULT

## 2020-11-15 ENCOUNTER — Other Ambulatory Visit: Payer: Self-pay | Admitting: Specialist

## 2020-11-15 DIAGNOSIS — R918 Other nonspecific abnormal finding of lung field: Secondary | ICD-10-CM

## 2020-11-22 LAB — ACID FAST CULTURE WITH REFLEXED SENSITIVITIES (MYCOBACTERIA): Acid Fast Culture: NEGATIVE

## 2020-12-05 ENCOUNTER — Inpatient Hospital Stay (HOSPITAL_BASED_OUTPATIENT_CLINIC_OR_DEPARTMENT_OTHER): Payer: Medicare Other | Admitting: Internal Medicine

## 2020-12-05 ENCOUNTER — Encounter: Payer: Self-pay | Admitting: Internal Medicine

## 2020-12-05 ENCOUNTER — Inpatient Hospital Stay: Payer: Medicare Other | Attending: Internal Medicine

## 2020-12-05 ENCOUNTER — Encounter: Payer: Self-pay | Admitting: Pharmacist

## 2020-12-05 ENCOUNTER — Inpatient Hospital Stay: Payer: Medicare Other

## 2020-12-05 VITALS — BP 117/47 | HR 73 | Temp 96.3°F | Resp 16 | Ht 67.0 in | Wt 140.0 lb

## 2020-12-05 DIAGNOSIS — D693 Immune thrombocytopenic purpura: Secondary | ICD-10-CM | POA: Diagnosis not present

## 2020-12-05 DIAGNOSIS — N183 Chronic kidney disease, stage 3 unspecified: Secondary | ICD-10-CM

## 2020-12-05 DIAGNOSIS — D631 Anemia in chronic kidney disease: Secondary | ICD-10-CM | POA: Diagnosis present

## 2020-12-05 LAB — BASIC METABOLIC PANEL
Anion gap: 9 (ref 5–15)
BUN: 26 mg/dL — ABNORMAL HIGH (ref 8–23)
CO2: 29 mmol/L (ref 22–32)
Calcium: 9.5 mg/dL (ref 8.9–10.3)
Chloride: 99 mmol/L (ref 98–111)
Creatinine, Ser: 1.52 mg/dL — ABNORMAL HIGH (ref 0.61–1.24)
GFR, Estimated: 46 mL/min — ABNORMAL LOW (ref >60)
Glucose, Bld: 109 mg/dL — ABNORMAL HIGH (ref 70–99)
Potassium: 4.5 mmol/L (ref 3.5–5.1)
Sodium: 137 mmol/L (ref 135–145)

## 2020-12-05 LAB — CBC WITH DIFFERENTIAL/PLATELET
Abs Immature Granulocytes: 0.02 10*3/uL (ref 0.00–0.07)
Basophils Absolute: 0.1 10*3/uL (ref 0.0–0.1)
Basophils Relative: 1 %
Eosinophils Absolute: 0.5 10*3/uL (ref 0.0–0.5)
Eosinophils Relative: 8 %
HCT: 30.1 % — ABNORMAL LOW (ref 39.0–52.0)
Hemoglobin: 9.9 g/dL — ABNORMAL LOW (ref 13.0–17.0)
Immature Granulocytes: 0 %
Lymphocytes Relative: 20 %
Lymphs Abs: 1.2 10*3/uL (ref 0.7–4.0)
MCH: 33 pg (ref 26.0–34.0)
MCHC: 32.9 g/dL (ref 30.0–36.0)
MCV: 100.3 fL — ABNORMAL HIGH (ref 80.0–100.0)
Monocytes Absolute: 0.8 10*3/uL (ref 0.1–1.0)
Monocytes Relative: 14 %
Neutro Abs: 3.6 10*3/uL (ref 1.7–7.7)
Neutrophils Relative %: 57 %
Platelets: 97 10*3/uL — ABNORMAL LOW (ref 150–400)
RBC: 3 MIL/uL — ABNORMAL LOW (ref 4.22–5.81)
RDW: 17.2 % — ABNORMAL HIGH (ref 11.5–15.5)
WBC: 6.2 10*3/uL (ref 4.0–10.5)
nRBC: 0 % (ref 0.0–0.2)

## 2020-12-05 MED ORDER — EPOETIN ALFA-EPBX 10000 UNIT/ML IJ SOLN
20000.0000 [IU] | Freq: Once | INTRAMUSCULAR | Status: AC
  Administered 2020-12-05: 20000 [IU] via SUBCUTANEOUS
  Filled 2020-12-05: qty 2

## 2020-12-05 NOTE — Progress Notes (Signed)
[facility] NOTE  Patient Care Team: Kirk Ruths, MD as PCP - General (Internal Medicine) Cammie Sickle, MD as Consulting Physician (Internal Medicine)  CHIEF COMPLAINTS/PURPOSE OF CONSULTATION: Anemia of chronic kidney disease/thrombocytopenia   HEMATOLOGY HISTORY  # CHRONIC ANEMIA- [at least 2018]- worsening- July 2020- 9/ nomocytic; NO Coloscopy; EGD- [place], 2017; October 2020-bone marrow biopsy-no overt myelodysplasia noted; subtle dyspoietic changes noted/karyotype normal; MDS FISH[[facility]]- NEG panel; HOLD off NGS.   #Chronic mild thrombocytopenia [120s]-question etiology; ? 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  Patient has not had any other hospitalizations.  Denies any blood in stools or black or stools.  Denies any nausea vomiting.  Complains of mild shortness of with exertion.  He has a good appetite.  However admits to weight loss.  Is currently on the same dose of furosemide.  Review of Systems  Constitutional: Positive for malaise/fatigue and weight loss. Negative for chills, diaphoresis and fever.  HENT: Negative for nosebleeds and sore throat.   Eyes: Negative for double vision.  Respiratory: Positive for shortness of breath. Negative for cough, hemoptysis, sputum production and wheezing.   Cardiovascular: Negative for chest pain, palpitations, orthopnea and leg swelling.   Gastrointestinal: Negative for abdominal pain, blood in stool, constipation, diarrhea, heartburn, melena, nausea and vomiting.  Genitourinary: Negative for dysuria, frequency and urgency.  Musculoskeletal: Negative for back pain and joint pain.  Skin: Negative.  Negative for itching and rash.  Neurological: Negative for dizziness, tingling, focal weakness, weakness and headaches.  Endo/Heme/Allergies: Does not bruise/bleed easily.  Psychiatric/Behavioral: Negative for depression. The patient is not nervous/anxious and does not have insomnia.     MEDICAL HISTORY:  Past Medical History:  Diagnosis Date  . Atrial fibrillation ([place])   . GAD (generalized anxiety disorder)   . Hypertension   . Prediabetes     SURGICAL HISTORY: Past Surgical History:  Procedure Laterality Date  . CARDIOVERSION N/A 02/24/2020   Procedure: CARDIOVERSION;  Surgeon: Isaias Cowman, MD;  Location: [facility] ORS;  Service: Cardiovascular;  Laterality: N/A;  . supraorbital artery repair  2005  . TOOTH EXTRACTION      SOCIAL HISTORY: Social History   Socioeconomic History  . Marital status: Single    Spouse name: Not on file  . Number of children: Not on file  . Years of education: Not on file  . Highest education level: Not on file  Occupational History  . Not on file  Tobacco Use  . Smoking status: Former Smoker    Types: Cigarettes  . Smokeless tobacco: Former Systems developer    Quit date: 2006  Vaping Use  . Vaping Use: Former  . Quit date: 03/14/2005  Substance and Sexual Activity  . Alcohol use: Not Currently    Comment: quit   . Drug use: Not Currently  . Sexual activity: Not on file  Other Topics Concern  . 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  Social Determinants of Health   Financial Resource Strain: Not on file  Food Insecurity: Not on file  Transportation Needs: Not on file  Physical Activity: Not  on file  Stress: Not on file  Social Connections: Not on file  Intimate Partner Violence: Not on file    FAMILY HISTORY: Family History  Problem Relation Age of Onset  . Lung cancer Father     ALLERGIES:  is allergic to bupropion, amiodarone, and diltiazem.  MEDICATIONS:  Current Outpatient Medications  Medication Sig Dispense Refill  . ALPRAZolam (XANAX) 0.5 MG tablet Take 0.5 mg by mouth 2 (two) times daily as needed for anxiety.     . digoxin (LANOXIN) 0.125 MG tablet Take by mouth.    . furosemide (LASIX) 20 MG tablet Take 20 mg by mouth daily.    . metoprolol tartrate (LOPRESSOR) 50 MG tablet Take 50 mg by mouth 2 (two) times daily.    Marland Kitchen warfarin (COUMADIN) 3 MG tablet Take 3-4.5 mg by mouth See admin instructions. Take 3 mg on Mon, Wed, Fri, and Sat. Take 4.5 mg on Tues, Thurs, and Sun.    Marland Kitchen eltrombopag (PROMACTA) 25 MG tablet Take 1 tablet (25 mg total) by mouth daily. Take on an empty stomach 1 hour before a meal or 2 hours after (Patient not taking: Reported on 12/05/2020) 30 tablet 6   No current facility-administered medications for this visit.   Facility-Administered Medications Ordered in Other Visits  Medication Dose Route Frequency Provider Last Rate Last Admin  . epoetin alfa-epbx (RETACRIT) injection 20,000 Units  20,000 Units Subcutaneous Once Charlaine Dalton R, MD          PHYSICAL EXAMINATION:   Vitals:   12/05/20 1426  BP: (!) 117/47  Pulse: 73  Resp: 16  Temp: (!) 96.3 F (35.7 C)  SpO2: 97%   Filed Weights   12/05/20 1426  Weight: 140 lb (63.5 kg)    Physical Exam HENT:     Head: Normocephalic and atraumatic.     Mouth/Throat:     Pharynx: No oropharyngeal exudate.  Eyes:     Pupils: Pupils are equal, round, and reactive to light.  Cardiovascular:     Comments: Irregular irregular rhythm; mild tachycardia; positive for murmur. Pulmonary:     Effort: Pulmonary effort is normal. No respiratory distress.     Breath sounds: Normal  breath sounds. No wheezing.  Abdominal:     General: Bowel sounds are normal. There is no distension.     Palpations: Abdomen is soft. There is no mass.     Tenderness: There is no abdominal tenderness. There is no guarding or rebound.  Musculoskeletal:        General: No tenderness. Normal range of motion.     Cervical back: Normal range of motion and neck supple.  Skin:    General: Skin is warm.     Comments: Chronic bruising noted.  Bilateral upper extremities.  Neurological:     Mental Status: He is alert and oriented to person, place, and time.  Psychiatric:        Mood and Affect: Affect normal.     LABORATORY DATA:  I have reviewed the data as listed Lab Results  Component Value Date   WBC 6.2 12/05/2020   HGB 9.9 (L) 12/05/2020   HCT 30.1 (L) 12/05/2020   MCV 100.3 (H) 12/05/2020   PLT 97 (L) 12/05/2020   Recent Labs    12/13/19 1412 12/27/19 1425 02/22/20 1501 10/10/20 1414  11/07/20 1253 12/05/20 1412  NA 139 135 140 135 137 137  K 4.3 4.6 4.1 4.1 4.2 4.5  CL 102 99 102 101 102 99  CO2 '27 25 28 26 25 29  ' GLUCOSE 99 130* 124* 116* 107* 109*  BUN 48* 51* 27* 31* 33* 26*  CREATININE 1.77* 1.61* 2.35* 1.76* 1.66* 1.52*  CALCIUM 9.1 8.9 9.0 8.9 8.8* 9.5  GFRNONAA 36* 40* 26* 39* 42* 46*  GFRAA 42* 47* 30*  --   --   --      CT CHEST WO CONTRAST  Result Date: 11/08/2020 CLINICAL DATA:  Recurrent right pleural effusion, history of smoking EXAM: CT CHEST WITHOUT CONTRAST TECHNIQUE: Multidetector CT imaging of the chest was performed following the standard protocol without IV contrast. COMPARISON:  None. FINDINGS: Cardiovascular: Aortic atherosclerosis. Enlargement of the tubular ascending thoracic aorta measuring up to 4.7 x 4.7 cm. Dense aortic valve calcifications. Mild cardiomegaly. Left coronary artery calcifications. No pericardial effusion. Gross enlargement of the main pulmonary artery measuring up to 4.2 cm in caliber. Mediastinum/Nodes: No enlarged  mediastinal, hilar, or axillary lymph nodes. Thyroid gland, trachea, and esophagus demonstrate no significant findings. Lungs/Pleura: Moderate centrilobular and paraseptal emphysema. Moderate right pleural effusion with associated atelectasis or consolidation. Trace left pleural effusion. There are adjacent nodular opacities of the anterior right upper lobe measuring 6 mm (series 3, image 52) and 7 mm (series 3, image 46). No pleural effusion or pneumothorax. Upper Abdomen: No acute abnormality. Nonobstructive calculi of the partially imaged kidneys. Musculoskeletal: No chest wall mass or suspicious bone lesions identified. IMPRESSION: 1. Moderate right pleural effusion with associated atelectasis or consolidation. Trace left pleural effusion. No specific etiology identified by noncontrast CT. 2. There are adjacent nodular opacities of the anterior right upper lobe measuring 6 mm and 7 mm, nonspecific. Non-contrast chest CT at 3-6 months is recommended. If the nodules are stable at time of repeat CT, then future CT at 18-24 months (from today's scan) is considered optional for low-risk patients, but is recommended for high-risk patients. This recommendation follows the consensus statement: Guidelines for Management of Incidental Pulmonary Nodules Detected on CT Images: From the Fleischner Society 2017; Radiology 2017; 284:228-243. 3. Moderate emphysema. 4. Coronary artery disease. 5. Enlargement of the tubular ascending thoracic aorta measuring up to 4.7 x 4.7 cm. Ascending thoracic aortic aneurysm. Recommend semi-annual imaging followup by CTA or MRA and referral to cardiothoracic surgery if not already obtained. This recommendation follows 2010 ACCF/AHA/AATS/ACR/ASA/SCA/SCAI/SIR/STS/SVM Guidelines for the Diagnosis and Management of Patients With Thoracic Aortic Disease. Circulation. 2010; 121: Z610-R604. Aortic aneurysm NOS (ICD10-I71.9) 6. Dense aortic valve calcifications. Correlate for echocardiographic  evidence of aortic valve dysfunction. 7. Gross enlargement of the main pulmonary artery measuring up to 4.2 cm in caliber, as can be seen in pulmonary hypertension. 8. Nonobstructive bilateral nephrolithiasis. Aortic Atherosclerosis (ICD10-I70.0) and Emphysema (ICD10-J43.9). Electronically Signed   By: Eddie Candle M.D.   On: 11/08/2020 08:28   Anemia of chronic kidney failure, stage 3 (moderate) #Anemia secondary to CKD/mild iron deficiency-on Retacrit; status post IV Venofer-hemoglobin today is 9.3;  Proceed with Retacrit today.  We will recheck iron studies in 3 months.  #Chronic ITP-stable.  [on Coumadin]-s/p Promacta 25m QOD [ran out in sep 2021] platelets-97; slowly trending down.  Recommend importance of restarting back on Promacta 25 mg especially as he is on Coumadin.  Again stressed compliance  #CHF-right pleural effusion needing thoracentesis x2 [Jan 2022]/A. fib on Coumadin;s/p [Dr.Paraschoes/Dr.Fleming] s/p cardioversion. INR-2.6 KC; stable  #  MGUS-clinically stable.  Will repeat myeloma panel at next visit.  # CKD stage-III-IV- follow up with PCP.   # weight loss: 13 pounds ? Sec to diuretics; monitor for now.  Do not suspect any other malignancy.  # DISPOSITION:  # RETACRIT today  # 1 month-cbc-possible retacrit; myeloma panel; kappa lambda light chain ratio #2  month-cbc-possible retacrit # iin 3 months- MD; cbc/bmp/iron  possible retacrit/ -  Dr.B   All questions were answered. The patient knows to call the clinic with any problems, questions or concerns.    Cammie Sickle, MD 12/05/2020 3:13 PM

## 2020-12-05 NOTE — Progress Notes (Signed)
Oral Chemotherapy Pharmacist Encounter   Patient mentions when checking in today that he has not started or received his Promacta. Patient advocate Dennison Nancy had previuosly spoke with Mr. Samuel Mcknight and instructed him to call [employer] Patient Assistance to get set-up for medication delivery. Today in clinic he stated he thought they would call him. I provided him with the number to the assistance program and instructed him to call to set-up his delivery. He stated his understanding.  Darl Pikes, PharmD, BCPS, BCOP, CPP Hematology/Oncology Clinical Pharmacist [facility]/AP Oral [facility] [phone]  12/05/2020 3:37 PM

## 2020-12-05 NOTE — Progress Notes (Signed)
Has not taken promacta in a year or so.

## 2020-12-05 NOTE — Assessment & Plan Note (Addendum)
#  Anemia secondary to CKD/mild iron deficiency-on Retacrit; status post IV Venofer-hemoglobin today is 9.3;  Proceed with Retacrit today.  We will recheck iron studies in 3 months.  #Chronic ITP-stable.  [on Coumadin]-s/p Promacta 50mg  QOD [ran out in sep 2021] platelets-97; slowly trending down.  Recommend importance of restarting back on Promacta 25 mg especially as he is on Coumadin.  Again stressed compliance  #CHF-right pleural effusion needing thoracentesis x2 [Jan 2022]/A. fib on Coumadin;s/p [Dr.Paraschoes/Dr.Fleming] s/p cardioversion. INR-2.6 KC; stable  # MGUS-clinically stable.  Will repeat myeloma panel at next visit.  # CKD stage-III-IV- follow up with PCP.   # weight loss: 13 pounds ? Sec to diuretics; monitor for now.  Do not suspect any other malignancy.  # DISPOSITION:  # RETACRIT today  # 1 month-cbc-possible retacrit; myeloma panel; kappa lambda light chain ratio #2  month-cbc-possible retacrit # iin 3 months- MD; cbc/bmp/iron  possible retacrit/ -  Dr.B

## 2020-12-23 IMAGING — US US ABDOMEN COMPLETE
1 series · 14 of 25 positions shown · non-contrast
Comparison: None.

CLINICAL DATA: Normocytic anemia

EXAM:
ABDOMEN ULTRASOUND COMPLETE

[Series 1: us abdomen complete · 0.19mm/px · 14 of 105 slices shown]
[im 1/105]
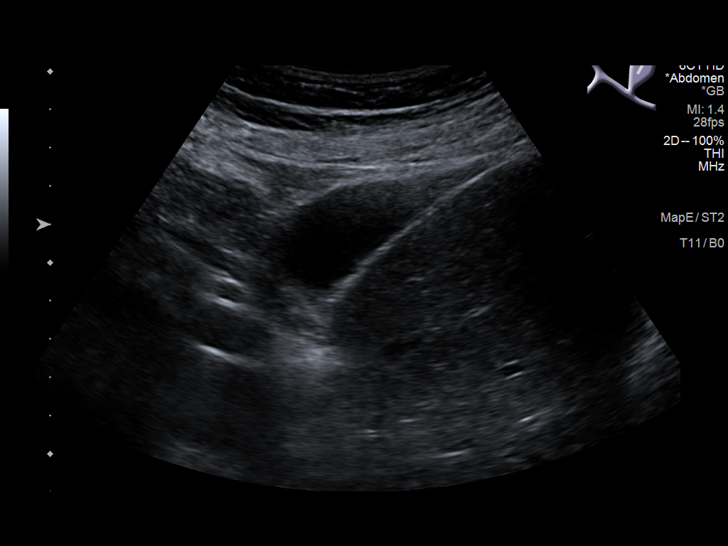
[im 9/105]
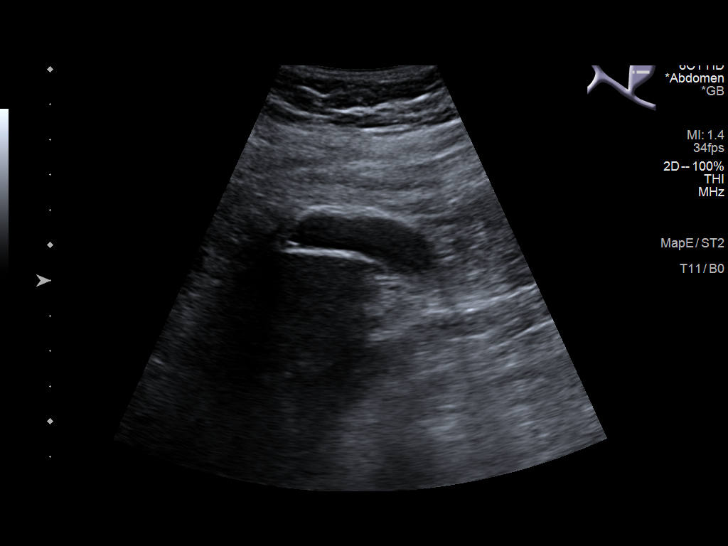
[im 18/105]
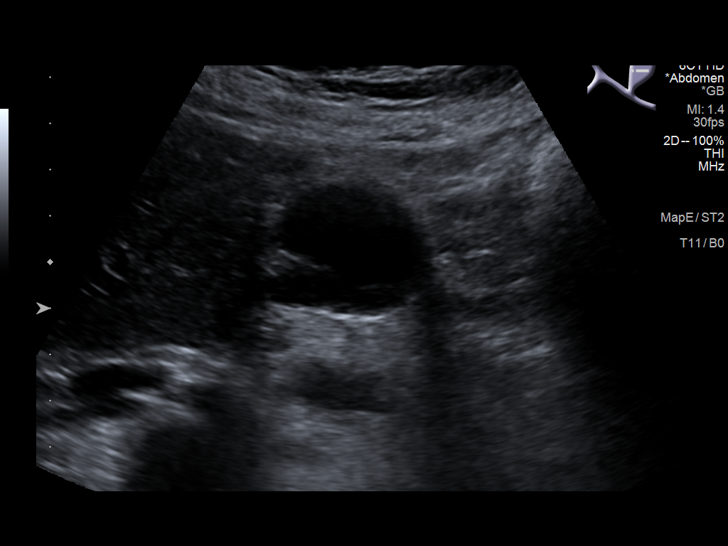
[im 27/105]
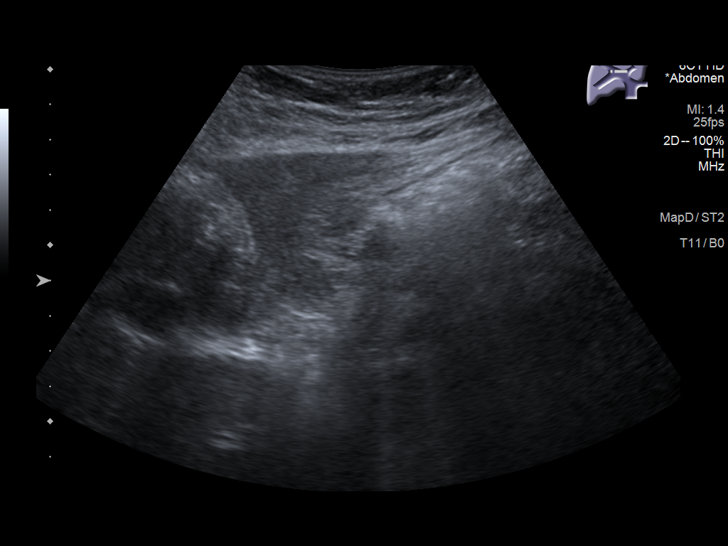
[im 35/105]
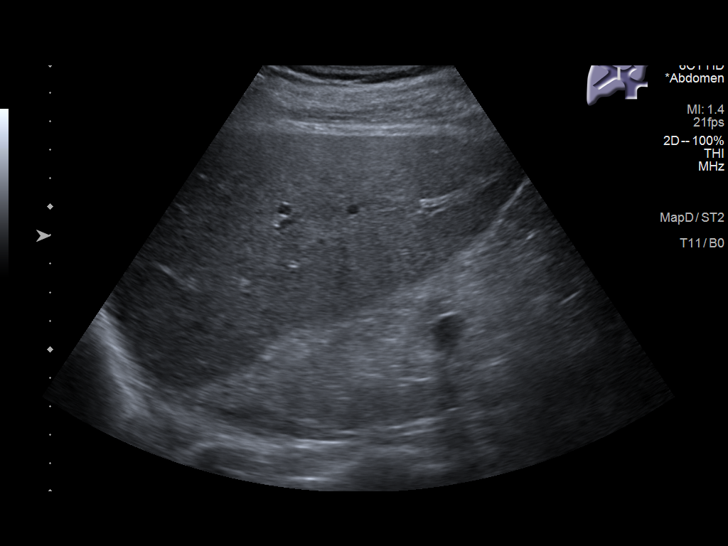
[im 40/105]
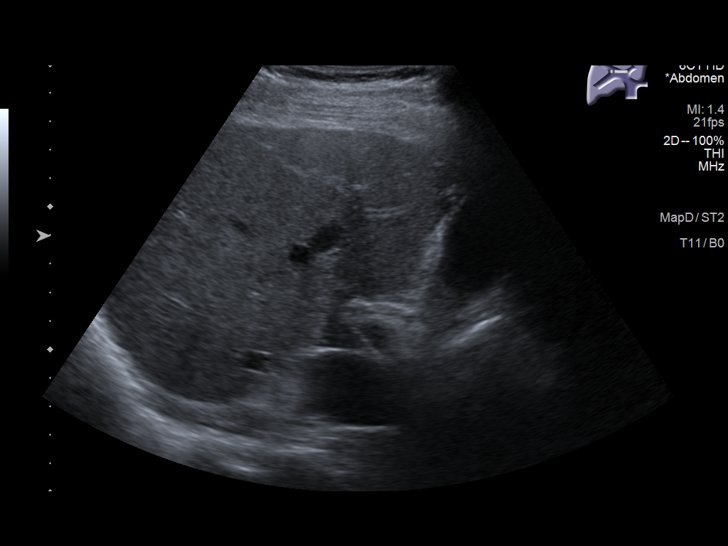
[im 48/105]
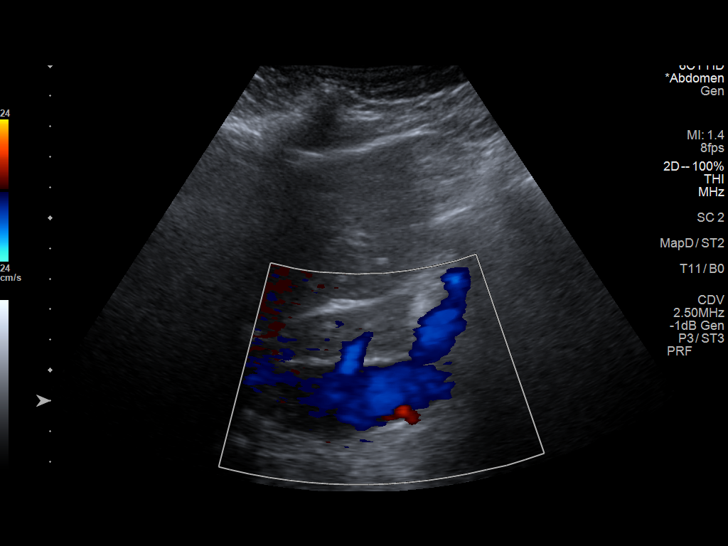
[im 57/105]
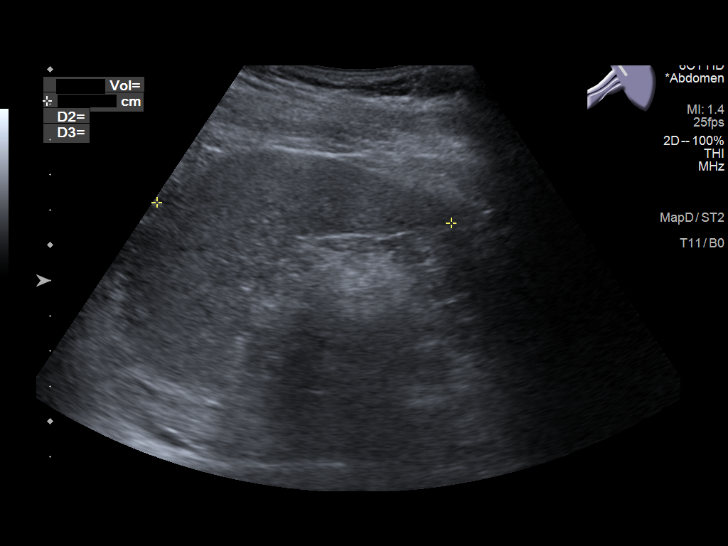
[im 66/105]
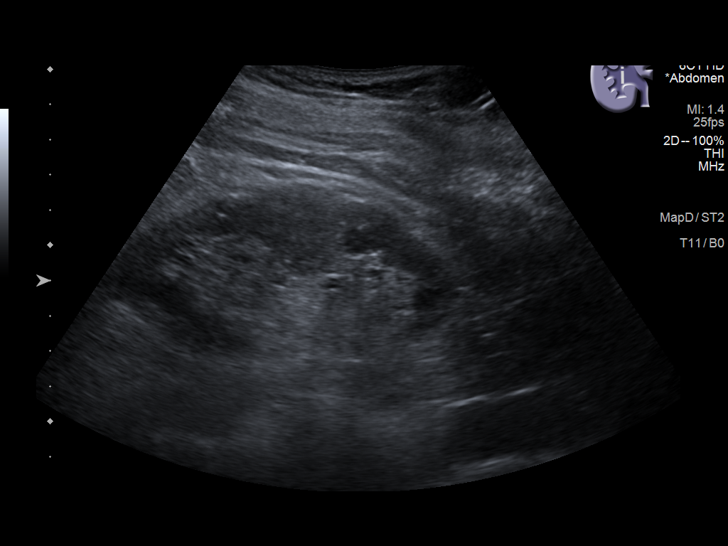
[im 70/105]
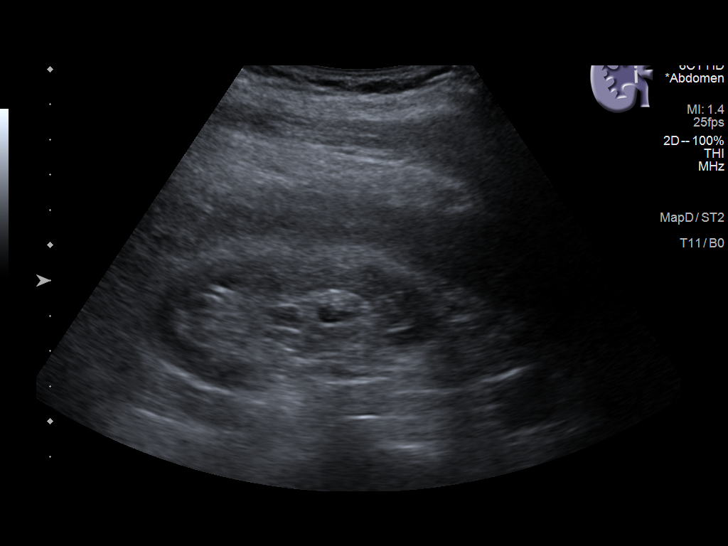
[im 79/105]
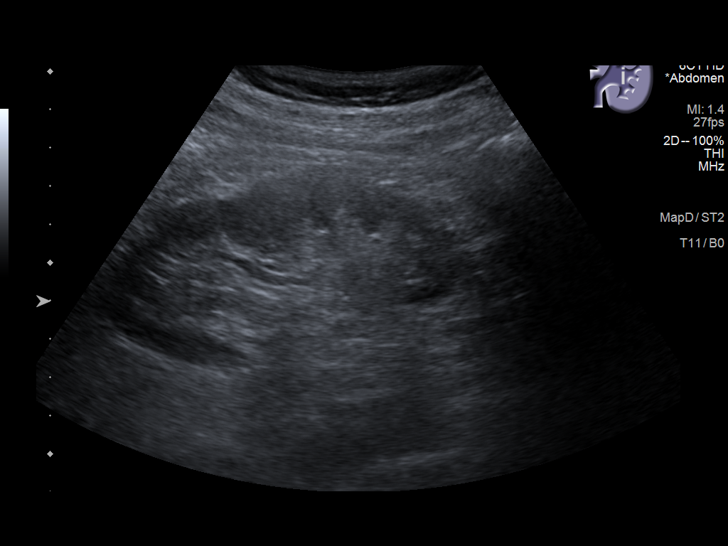
[im 87/105]
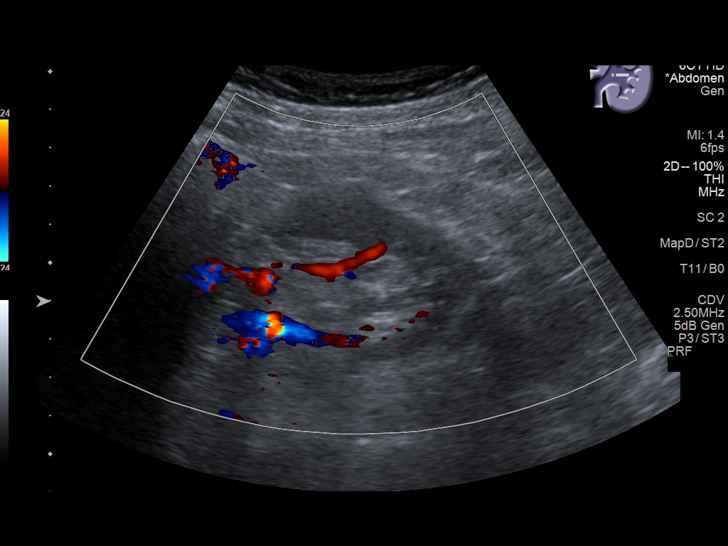
[im 96/105]
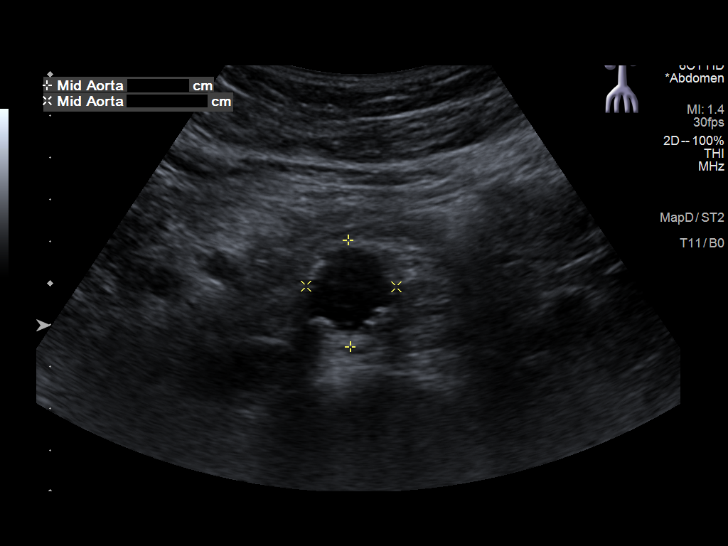
[im 105/105]
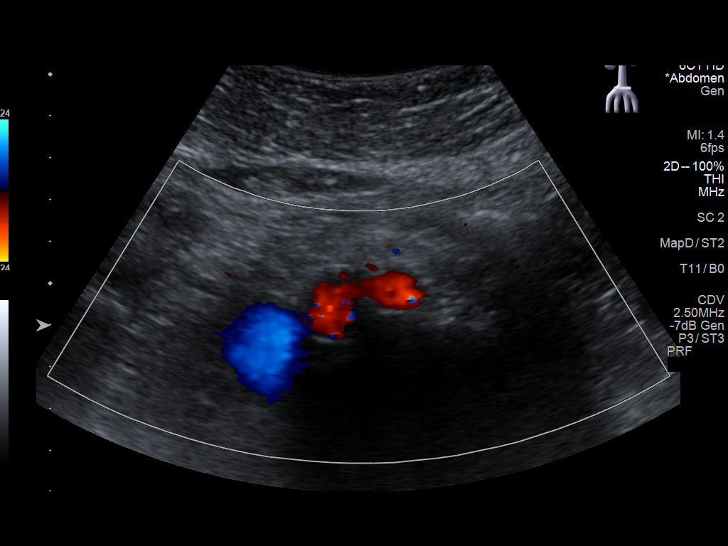

[14 of 25 positions shown; findings below may reference images not displayed]

FINDINGS: Gallbladder: Gallbladder is well distended. Multiple small calculi
are identified. No wall thickening or pericholecystic fluid is seen.

Common bile duct: Diameter: 4.3 mm.

Liver: Mild nodularity to the contour of the liver is noted. No
focal mass is seen. Portal vein is patent on color Doppler imaging
with normal direction of blood flow towards the liver.

IVC: No abnormality visualized.

Pancreas: Visualized portion unremarkable.

Spleen: Size and appearance within normal limits.

Right Kidney: Length: 9.8 cm. No mass lesion or hydronephrosis is
noted. Mild cortical thinning is seen.

Left Kidney: Length: 10.1 cm. No mass lesion or hydronephrosis is
noted. Mild cortical thinning is seen.

Abdominal aorta: Atherosclerotic changes without evidence of
aneurysmal dilatation.

Other findings: None.
IMPRESSION: Mild nodularity of the liver likely representing some early
cirrhosis.

Small gallstones without complicating factors.

Mild cortical thinning in the kidneys bilaterally.

## 2020-12-29 ENCOUNTER — Other Ambulatory Visit: Payer: Self-pay

## 2020-12-29 DIAGNOSIS — N183 Chronic kidney disease, stage 3 unspecified: Secondary | ICD-10-CM

## 2021-01-02 ENCOUNTER — Inpatient Hospital Stay: Payer: Medicare Other | Attending: Internal Medicine

## 2021-01-02 ENCOUNTER — Inpatient Hospital Stay: Payer: Medicare Other

## 2021-01-02 VITALS — BP 90/61 | HR 99

## 2021-01-02 DIAGNOSIS — N183 Chronic kidney disease, stage 3 unspecified: Secondary | ICD-10-CM

## 2021-01-02 DIAGNOSIS — D631 Anemia in chronic kidney disease: Secondary | ICD-10-CM | POA: Diagnosis present

## 2021-01-02 DIAGNOSIS — N1831 Chronic kidney disease, stage 3a: Secondary | ICD-10-CM | POA: Insufficient documentation

## 2021-01-02 LAB — CBC WITH DIFFERENTIAL/PLATELET
Abs Immature Granulocytes: 0.02 K/uL (ref 0.00–0.07)
Basophils Absolute: 0.1 K/uL (ref 0.0–0.1)
Basophils Relative: 1 %
Eosinophils Absolute: 0.5 K/uL (ref 0.0–0.5)
Eosinophils Relative: 9 %
HCT: 28.9 % — ABNORMAL LOW (ref 39.0–52.0)
Hemoglobin: 9.7 g/dL — ABNORMAL LOW (ref 13.0–17.0)
Immature Granulocytes: 0 %
Lymphocytes Relative: 17 %
Lymphs Abs: 1 K/uL (ref 0.7–4.0)
MCH: 33.2 pg (ref 26.0–34.0)
MCHC: 33.6 g/dL (ref 30.0–36.0)
MCV: 99 fL (ref 80.0–100.0)
Monocytes Absolute: 1 K/uL (ref 0.1–1.0)
Monocytes Relative: 16 %
Neutro Abs: 3.5 K/uL (ref 1.7–7.7)
Neutrophils Relative %: 57 %
Platelets: 278 K/uL (ref 150–400)
RBC: 2.92 MIL/uL — ABNORMAL LOW (ref 4.22–5.81)
RDW: 16.7 % — ABNORMAL HIGH (ref 11.5–15.5)
WBC: 6.1 K/uL (ref 4.0–10.5)
nRBC: 0 % (ref 0.0–0.2)

## 2021-01-02 MED ORDER — EPOETIN ALFA-EPBX 10000 UNIT/ML IJ SOLN
20000.0000 [IU] | Freq: Once | INTRAMUSCULAR | Status: AC
  Administered 2021-01-02: 20000 [IU] via SUBCUTANEOUS
  Filled 2021-01-02: qty 2

## 2021-01-03 LAB — KAPPA/LAMBDA LIGHT CHAINS
Kappa free light chain: 124.3 mg/L — ABNORMAL HIGH (ref 3.3–19.4)
Kappa, lambda light chain ratio: 1.79 — ABNORMAL HIGH (ref 0.26–1.65)
Lambda free light chains: 69.6 mg/L — ABNORMAL HIGH (ref 5.7–26.3)

## 2021-01-08 LAB — MULTIPLE MYELOMA PANEL, SERUM
Albumin SerPl Elph-Mcnc: 4 g/dL (ref 2.9–4.4)
Albumin/Glob SerPl: 1.1 (ref 0.7–1.7)
Alpha 1: 0.3 g/dL (ref 0.0–0.4)
Alpha2 Glob SerPl Elph-Mcnc: 0.6 g/dL (ref 0.4–1.0)
B-Globulin SerPl Elph-Mcnc: 0.8 g/dL (ref 0.7–1.3)
Gamma Glob SerPl Elph-Mcnc: 2.1 g/dL — ABNORMAL HIGH (ref 0.4–1.8)
Globulin, Total: 3.8 g/dL (ref 2.2–3.9)
IgA: 361 mg/dL (ref 61–437)
IgG (Immunoglobin G), Serum: 2210 mg/dL — ABNORMAL HIGH (ref 603–1613)
IgM (Immunoglobulin M), Srm: 174 mg/dL — ABNORMAL HIGH (ref 15–143)
M Protein SerPl Elph-Mcnc: 0.8 g/dL — ABNORMAL HIGH
Total Protein ELP: 7.8 g/dL (ref 6.0–8.5)

## 2021-01-26 ENCOUNTER — Encounter: Payer: Self-pay | Admitting: Internal Medicine

## 2021-02-06 ENCOUNTER — Other Ambulatory Visit: Payer: Self-pay

## 2021-02-06 ENCOUNTER — Encounter: Payer: Self-pay | Admitting: Internal Medicine

## 2021-02-06 ENCOUNTER — Inpatient Hospital Stay: Payer: Medicare Other | Attending: Internal Medicine

## 2021-02-06 ENCOUNTER — Inpatient Hospital Stay: Payer: Medicare Other

## 2021-02-06 VITALS — BP 118/55 | HR 66

## 2021-02-06 DIAGNOSIS — N183 Chronic kidney disease, stage 3 unspecified: Secondary | ICD-10-CM | POA: Diagnosis present

## 2021-02-06 DIAGNOSIS — D631 Anemia in chronic kidney disease: Secondary | ICD-10-CM | POA: Insufficient documentation

## 2021-02-06 LAB — CBC WITH DIFFERENTIAL/PLATELET
Abs Immature Granulocytes: 0.03 10*3/uL (ref 0.00–0.07)
Basophils Absolute: 0.1 10*3/uL (ref 0.0–0.1)
Basophils Relative: 1 %
Eosinophils Absolute: 0.5 10*3/uL (ref 0.0–0.5)
Eosinophils Relative: 6 %
HCT: 25.5 % — ABNORMAL LOW (ref 39.0–52.0)
Hemoglobin: 8.4 g/dL — ABNORMAL LOW (ref 13.0–17.0)
Immature Granulocytes: 0 %
Lymphocytes Relative: 18 %
Lymphs Abs: 1.3 10*3/uL (ref 0.7–4.0)
MCH: 32.4 pg (ref 26.0–34.0)
MCHC: 32.9 g/dL (ref 30.0–36.0)
MCV: 98.5 fL (ref 80.0–100.0)
Monocytes Absolute: 1.2 10*3/uL — ABNORMAL HIGH (ref 0.1–1.0)
Monocytes Relative: 15 %
Neutro Abs: 4.6 10*3/uL (ref 1.7–7.7)
Neutrophils Relative %: 60 %
Platelets: 277 10*3/uL (ref 150–400)
RBC: 2.59 MIL/uL — ABNORMAL LOW (ref 4.22–5.81)
RDW: 17 % — ABNORMAL HIGH (ref 11.5–15.5)
WBC: 7.6 10*3/uL (ref 4.0–10.5)
nRBC: 0 % (ref 0.0–0.2)

## 2021-02-06 MED ORDER — EPOETIN ALFA-EPBX 10000 UNIT/ML IJ SOLN
20000.0000 [IU] | Freq: Once | INTRAMUSCULAR | Status: AC
  Administered 2021-02-06: 20000 [IU] via SUBCUTANEOUS
  Filled 2021-02-06: qty 2

## 2021-02-28 ENCOUNTER — Inpatient Hospital Stay
Admission: EM | Admit: 2021-02-28 | Discharge: 2021-03-05 | DRG: 871 | Disposition: A | Discharge status: Home Health | Payer: Medicare Other | Attending: Internal Medicine | Admitting: Internal Medicine

## 2021-02-28 ENCOUNTER — Encounter: Payer: Self-pay | Admitting: Emergency Medicine

## 2021-02-28 ENCOUNTER — Emergency Department: Payer: Medicare Other

## 2021-02-28 ENCOUNTER — Other Ambulatory Visit: Payer: Self-pay

## 2021-02-28 DIAGNOSIS — Z79899 Other long term (current) drug therapy: Secondary | ICD-10-CM

## 2021-02-28 DIAGNOSIS — I129 Hypertensive chronic kidney disease with stage 1 through stage 4 chronic kidney disease, or unspecified chronic kidney disease: Secondary | ICD-10-CM | POA: Diagnosis present

## 2021-02-28 DIAGNOSIS — J189 Pneumonia, unspecified organism: Secondary | ICD-10-CM

## 2021-02-28 DIAGNOSIS — D539 Nutritional anemia, unspecified: Secondary | ICD-10-CM | POA: Diagnosis present

## 2021-02-28 DIAGNOSIS — N1831 Chronic kidney disease, stage 3a: Secondary | ICD-10-CM | POA: Diagnosis present

## 2021-02-28 DIAGNOSIS — J90 Pleural effusion, not elsewhere classified: Secondary | ICD-10-CM

## 2021-02-28 DIAGNOSIS — R7303 Prediabetes: Secondary | ICD-10-CM | POA: Diagnosis present

## 2021-02-28 DIAGNOSIS — D631 Anemia in chronic kidney disease: Secondary | ICD-10-CM | POA: Diagnosis present

## 2021-02-28 DIAGNOSIS — R001 Bradycardia, unspecified: Secondary | ICD-10-CM | POA: Diagnosis not present

## 2021-02-28 DIAGNOSIS — I2721 Secondary pulmonary arterial hypertension: Secondary | ICD-10-CM | POA: Diagnosis present

## 2021-02-28 DIAGNOSIS — I5033 Acute on chronic diastolic (congestive) heart failure: Secondary | ICD-10-CM

## 2021-02-28 DIAGNOSIS — J9621 Acute and chronic respiratory failure with hypoxia: Secondary | ICD-10-CM | POA: Diagnosis present

## 2021-02-28 DIAGNOSIS — D696 Thrombocytopenia, unspecified: Secondary | ICD-10-CM | POA: Diagnosis present

## 2021-02-28 DIAGNOSIS — I712 Thoracic aortic aneurysm, without rupture: Secondary | ICD-10-CM | POA: Diagnosis present

## 2021-02-28 DIAGNOSIS — Z9889 Other specified postprocedural states: Secondary | ICD-10-CM

## 2021-02-28 DIAGNOSIS — Z20822 Contact with and (suspected) exposure to covid-19: Secondary | ICD-10-CM | POA: Diagnosis present

## 2021-02-28 DIAGNOSIS — I480 Paroxysmal atrial fibrillation: Secondary | ICD-10-CM | POA: Diagnosis present

## 2021-02-28 DIAGNOSIS — Z801 Family history of malignant neoplasm of trachea, bronchus and lung: Secondary | ICD-10-CM

## 2021-02-28 DIAGNOSIS — F411 Generalized anxiety disorder: Secondary | ICD-10-CM | POA: Diagnosis present

## 2021-02-28 DIAGNOSIS — I700 Atherosclerosis of aorta: Secondary | ICD-10-CM | POA: Diagnosis present

## 2021-02-28 DIAGNOSIS — Z87891 Personal history of nicotine dependence: Secondary | ICD-10-CM

## 2021-02-28 DIAGNOSIS — Z7901 Long term (current) use of anticoagulants: Secondary | ICD-10-CM | POA: Diagnosis not present

## 2021-02-28 DIAGNOSIS — D472 Monoclonal gammopathy: Secondary | ICD-10-CM | POA: Diagnosis present

## 2021-02-28 DIAGNOSIS — Q231 Congenital insufficiency of aortic valve: Secondary | ICD-10-CM

## 2021-02-28 DIAGNOSIS — Z888 Allergy status to other drugs, medicaments and biological substances status: Secondary | ICD-10-CM

## 2021-02-28 DIAGNOSIS — J439 Emphysema, unspecified: Secondary | ICD-10-CM | POA: Diagnosis present

## 2021-02-28 DIAGNOSIS — J9611 Chronic respiratory failure with hypoxia: Secondary | ICD-10-CM | POA: Diagnosis not present

## 2021-02-28 DIAGNOSIS — Z9981 Dependence on supplemental oxygen: Secondary | ICD-10-CM

## 2021-02-28 DIAGNOSIS — A419 Sepsis, unspecified organism: Secondary | ICD-10-CM | POA: Diagnosis present

## 2021-02-28 DIAGNOSIS — J181 Lobar pneumonia, unspecified organism: Secondary | ICD-10-CM | POA: Diagnosis present

## 2021-02-28 LAB — CBC WITH DIFFERENTIAL/PLATELET
Abs Immature Granulocytes: 0.06 10*3/uL (ref 0.00–0.07)
Basophils Absolute: 0.1 10*3/uL (ref 0.0–0.1)
Basophils Relative: 1 %
Eosinophils Absolute: 0.5 10*3/uL (ref 0.0–0.5)
Eosinophils Relative: 4 %
HCT: 27 % — ABNORMAL LOW (ref 39.0–52.0)
Hemoglobin: 8.7 g/dL — ABNORMAL LOW (ref 13.0–17.0)
Immature Granulocytes: 0 %
Lymphocytes Relative: 13 %
Lymphs Abs: 1.9 10*3/uL (ref 0.7–4.0)
MCH: 32.6 pg (ref 26.0–34.0)
MCHC: 32.2 g/dL (ref 30.0–36.0)
MCV: 101.1 fL — ABNORMAL HIGH (ref 80.0–100.0)
Monocytes Absolute: 2.3 10*3/uL — ABNORMAL HIGH (ref 0.1–1.0)
Monocytes Relative: 16 %
Neutro Abs: 9.6 10*3/uL — ABNORMAL HIGH (ref 1.7–7.7)
Neutrophils Relative %: 66 %
Platelets: 340 10*3/uL (ref 150–400)
RBC: 2.67 MIL/uL — ABNORMAL LOW (ref 4.22–5.81)
RDW: 18.8 % — ABNORMAL HIGH (ref 11.5–15.5)
WBC: 14.4 10*3/uL — ABNORMAL HIGH (ref 4.0–10.5)
nRBC: 0 % (ref 0.0–0.2)

## 2021-02-28 LAB — BASIC METABOLIC PANEL
Anion gap: 7 (ref 5–15)
BUN: 36 mg/dL — ABNORMAL HIGH (ref 8–23)
CO2: 27 mmol/L (ref 22–32)
Calcium: 9.2 mg/dL (ref 8.9–10.3)
Chloride: 102 mmol/L (ref 98–111)
Creatinine, Ser: 1.59 mg/dL — ABNORMAL HIGH (ref 0.61–1.24)
GFR, Estimated: 44 mL/min — ABNORMAL LOW (ref >60)
Glucose, Bld: 124 mg/dL — ABNORMAL HIGH (ref 70–99)
Potassium: 4.8 mmol/L (ref 3.5–5.1)
Sodium: 136 mmol/L (ref 135–145)

## 2021-02-28 LAB — HEPATIC FUNCTION PANEL
ALT: 17 U/L (ref 0–44)
AST: 33 U/L (ref 15–41)
Albumin: 3.8 g/dL (ref 3.5–5.0)
Alkaline Phosphatase: 95 U/L (ref 38–126)
Bilirubin, Direct: 0.3 mg/dL — ABNORMAL HIGH (ref 0.0–0.2)
Indirect Bilirubin: 0.8 mg/dL (ref 0.3–0.9)
Total Bilirubin: 1.1 mg/dL (ref 0.3–1.2)
Total Protein: 8.5 g/dL — ABNORMAL HIGH (ref 6.5–8.1)

## 2021-02-28 LAB — URINALYSIS, COMPLETE (UACMP) WITH MICROSCOPIC
Bilirubin Urine: NEGATIVE
Glucose, UA: NEGATIVE mg/dL
Hgb urine dipstick: NEGATIVE
Ketones, ur: NEGATIVE mg/dL
Nitrite: POSITIVE — AB
Protein, ur: NEGATIVE mg/dL
Specific Gravity, Urine: 1.028 (ref 1.005–1.030)
pH: 5 (ref 5.0–8.0)

## 2021-02-28 LAB — PROTIME-INR
INR: 2 — ABNORMAL HIGH (ref 0.8–1.2)
Prothrombin Time: 22.3 seconds — ABNORMAL HIGH (ref 11.4–15.2)

## 2021-02-28 LAB — CBC
HCT: 26.9 % — ABNORMAL LOW (ref 39.0–52.0)
Hemoglobin: 8.8 g/dL — ABNORMAL LOW (ref 13.0–17.0)
MCH: 33.2 pg (ref 26.0–34.0)
MCHC: 32.7 g/dL (ref 30.0–36.0)
MCV: 101.5 fL — ABNORMAL HIGH (ref 80.0–100.0)
Platelets: 332 10*3/uL (ref 150–400)
RBC: 2.65 MIL/uL — ABNORMAL LOW (ref 4.22–5.81)
RDW: 18.8 % — ABNORMAL HIGH (ref 11.5–15.5)
WBC: 14.3 10*3/uL — ABNORMAL HIGH (ref 4.0–10.5)
nRBC: 0 % (ref 0.0–0.2)

## 2021-02-28 LAB — TROPONIN I (HIGH SENSITIVITY)
Troponin I (High Sensitivity): 17 ng/L (ref <18)
Troponin I (High Sensitivity): 21 ng/L — ABNORMAL HIGH (ref <18)

## 2021-02-28 LAB — LACTIC ACID, PLASMA
Lactic Acid, Venous: 2.7 mmol/L (ref 0.5–1.9)
Lactic Acid, Venous: 1.2 mmol/L (ref 0.5–1.9)

## 2021-02-28 LAB — RESP PANEL BY RT-PCR (FLU A&B, COVID) ARPGX2
Influenza A by PCR: NEGATIVE
Influenza B by PCR: NEGATIVE
SARS Coronavirus 2 by RT PCR: NEGATIVE

## 2021-02-28 LAB — PROCALCITONIN: Procalcitonin: 0.17 ng/mL

## 2021-02-28 LAB — BLOOD GAS, ARTERIAL
Acid-Base Excess: 1.6 mmol/L (ref 0.0–2.0)
Bicarbonate: 25.8 mmol/L (ref 20.0–28.0)
FIO2: 100
O2 Saturation: 97.9 %
Patient temperature: 37
pCO2 arterial: 38 mmHg (ref 32.0–48.0)
pH, Arterial: 7.44 (ref 7.350–7.450)
pO2, Arterial: 98 mmHg (ref 83.0–108.0)

## 2021-02-28 LAB — STREP PNEUMONIAE URINARY ANTIGEN: Strep Pneumo Urinary Antigen: NEGATIVE

## 2021-02-28 LAB — BRAIN NATRIURETIC PEPTIDE: B Natriuretic Peptide: 1033.9 pg/mL — ABNORMAL HIGH (ref 0.0–100.0)

## 2021-02-28 LAB — APTT: aPTT: 42 seconds — ABNORMAL HIGH (ref 24–36)

## 2021-02-28 MED ORDER — IOHEXOL 350 MG/ML SOLN
75.0000 mL | Freq: Once | INTRAVENOUS | Status: AC | PRN
  Administered 2021-02-28: 75 mL via INTRAVENOUS

## 2021-02-28 MED ORDER — WARFARIN SODIUM 2.5 MG PO TABS: 2.5000 mg | ORAL_TABLET | ORAL | Status: DC

## 2021-02-28 MED ORDER — ELTROMBOPAG OLAMINE 25 MG PO TABS
25.0000 mg | ORAL_TABLET | Freq: Every day | ORAL | Status: DC
  Administered 2021-03-03 – 2021-03-05 (×3): 25 mg via ORAL
  Filled 2021-02-28 (×9): qty 1

## 2021-02-28 MED ORDER — ACETAMINOPHEN 500 MG PO TABS
1000.0000 mg | ORAL_TABLET | Freq: Once | ORAL | Status: AC
  Administered 2021-02-28: 1000 mg via ORAL
  Filled 2021-02-28: qty 2

## 2021-02-28 MED ORDER — ALPRAZOLAM 0.5 MG PO TABS
0.5000 mg | ORAL_TABLET | Freq: Two times a day (BID) | ORAL | Status: DC | PRN
  Administered 2021-02-28 – 2021-03-04 (×5): 0.5 mg via ORAL
  Filled 2021-02-28 (×5): qty 1

## 2021-02-28 MED ORDER — VANCOMYCIN HCL 1500 MG/300ML IV SOLN
1500.0000 mg | Freq: Once | INTRAVENOUS | Status: AC
  Administered 2021-02-28: 1500 mg via INTRAVENOUS
  Filled 2021-02-28: qty 300

## 2021-02-28 MED ORDER — SODIUM CHLORIDE 0.9 % IV SOLN
2.0000 g | Freq: Once | INTRAVENOUS | Status: AC
  Administered 2021-02-28: 2 g via INTRAVENOUS
  Filled 2021-02-28: qty 2

## 2021-02-28 MED ORDER — SODIUM CHLORIDE 0.9 % IV SOLN
2.0000 g | INTRAVENOUS | Status: AC
  Administered 2021-02-28 – 2021-03-04 (×5): 2 g via INTRAVENOUS
  Filled 2021-02-28: qty 2
  Filled 2021-02-28 (×4): qty 20
  Filled 2021-02-28: qty 2

## 2021-02-28 MED ORDER — MAGNESIUM HYDROXIDE 400 MG/5ML PO SUSP
30.0000 mL | Freq: Every day | ORAL | Status: DC | PRN
  Filled 2021-02-28: qty 30

## 2021-02-28 MED ORDER — ONDANSETRON HCL 4 MG/2ML IJ SOLN: 4.0000 mg | Freq: Four times a day (QID) | INTRAMUSCULAR | Status: DC | PRN

## 2021-02-28 MED ORDER — DIGOXIN 125 MCG PO TABS
0.1250 mg | ORAL_TABLET | Freq: Every day | ORAL | Status: DC
  Administered 2021-02-28 – 2021-03-05 (×6): 0.125 mg via ORAL
  Filled 2021-02-28 (×7): qty 1

## 2021-02-28 MED ORDER — LACTATED RINGERS IV SOLN: INTRAVENOUS | Status: DC

## 2021-02-28 MED ORDER — WARFARIN - PHARMACIST DOSING INPATIENT
Freq: Every day | Status: DC
  Filled 2021-02-28: qty 1

## 2021-02-28 MED ORDER — METRONIDAZOLE 500 MG/100ML IV SOLN
500.0000 mg | Freq: Once | INTRAVENOUS | Status: AC
  Administered 2021-02-28: 500 mg via INTRAVENOUS

## 2021-02-28 MED ORDER — TRAZODONE HCL 50 MG PO TABS: 25.0000 mg | ORAL_TABLET | Freq: Every evening | ORAL | Status: DC | PRN

## 2021-02-28 MED ORDER — WARFARIN SODIUM 2 MG PO TABS
2.0000 mg | ORAL_TABLET | Freq: Once | ORAL | Status: AC
  Administered 2021-02-28: 2 mg via ORAL
  Filled 2021-02-28: qty 1

## 2021-02-28 MED ORDER — SODIUM CHLORIDE 0.9 % IV SOLN
500.0000 mg | INTRAVENOUS | Status: AC
  Administered 2021-02-28 – 2021-03-04 (×5): 500 mg via INTRAVENOUS
  Filled 2021-02-28 (×5): qty 500

## 2021-02-28 MED ORDER — ACETAMINOPHEN 325 MG PO TABS: 650.0000 mg | ORAL_TABLET | Freq: Four times a day (QID) | ORAL | Status: DC | PRN

## 2021-02-28 MED ORDER — ACETAMINOPHEN 650 MG RE SUPP: 650.0000 mg | Freq: Four times a day (QID) | RECTAL | Status: DC | PRN

## 2021-02-28 MED ORDER — WARFARIN SODIUM 2 MG PO TABS: 2.0000 mg | ORAL_TABLET | Freq: Every day | ORAL | Status: DC

## 2021-02-28 MED ORDER — METOPROLOL TARTRATE 50 MG PO TABS
100.0000 mg | ORAL_TABLET | Freq: Two times a day (BID) | ORAL | Status: DC
  Administered 2021-02-28 – 2021-03-02 (×4): 100 mg via ORAL
  Filled 2021-02-28 (×6): qty 2

## 2021-02-28 MED ORDER — ENOXAPARIN SODIUM 40 MG/0.4ML IJ SOSY: 40.0000 mg | PREFILLED_SYRINGE | INTRAMUSCULAR | Status: DC

## 2021-02-28 MED ORDER — VANCOMYCIN HCL IN DEXTROSE 1-5 GM/200ML-% IV SOLN: 1000.0000 mg | Freq: Once | INTRAVENOUS | Status: DC

## 2021-02-28 MED ORDER — LACTATED RINGERS IV BOLUS
2000.0000 mL | Freq: Once | INTRAVENOUS | Status: AC
  Administered 2021-02-28: 2000 mL via INTRAVENOUS

## 2021-02-28 MED ORDER — FUROSEMIDE 10 MG/ML IJ SOLN
40.0000 mg | Freq: Two times a day (BID) | INTRAMUSCULAR | Status: DC
  Administered 2021-02-28 – 2021-03-02 (×4): 40 mg via INTRAVENOUS
  Filled 2021-02-28 (×4): qty 4

## 2021-02-28 MED ORDER — SODIUM CHLORIDE 0.9 % IV SOLN: INTRAVENOUS | Status: DC

## 2021-02-28 MED ORDER — IPRATROPIUM-ALBUTEROL 0.5-2.5 (3) MG/3ML IN SOLN: 3.0000 mL | Freq: Four times a day (QID) | RESPIRATORY_TRACT | Status: DC | PRN

## 2021-02-28 MED ORDER — ONDANSETRON HCL 4 MG PO TABS: 4.0000 mg | ORAL_TABLET | Freq: Four times a day (QID) | ORAL | Status: DC | PRN

## 2021-02-28 MED ORDER — FUROSEMIDE 10 MG/ML IJ SOLN
40.0000 mg | Freq: Every day | INTRAMUSCULAR | Status: DC
  Administered 2021-02-28: 40 mg via INTRAVENOUS
  Filled 2021-02-28: qty 4

## 2021-02-28 NOTE — Progress Notes (Signed)
ANTICOAGULATION CONSULT NOTE - Initial Consult  Pharmacy Consult for warfarin Indication: atrial fibrillation  Allergies  Allergen Reactions   Bupropion Swelling   Amiodarone Rash    Broke out in dark blue blotches   Diltiazem Rash    Patient Measurements: Height: 5\' 7"  (170.2 cm) Weight: 63.5 kg (140 lb) IBW/kg (Calculated) : 66.1 Heparin Dosing Weight:    Vital Signs: Temp: 97.6 F (36.4 C) (06/29 0659) Temp Source: Oral (06/29 0659) BP: 113/43 (06/29 1030) Pulse Rate: 79 (06/29 1030)  Labs: Recent Labs    02/28/21 0101 02/28/21 0314 02/28/21 0316  HGB 8.7*  8.8*  --   --   HCT 27.0*  26.9*  --   --   PLT 340  332  --   --   APTT  --   --  42*  LABPROT  --   --  22.3*  INR  --   --  2.0*  CREATININE 1.59*  --   --   TROPONINIHS 17 21*  --     Estimated Creatinine Clearance: 33.3 mL/min (A) (by C-G formula based on SCr of 1.59 mg/dL (H)).   Medical History: Past Medical History:  Diagnosis Date   Atrial fibrillation ([facility])    GAD (generalized anxiety disorder)    Hypertension    Prediabetes     Medications:  Scheduled:   digoxin  0.125 mg Oral Daily   eltrombopag  25 mg Oral Daily   furosemide  40 mg Intravenous Daily   metoprolol tartrate  100 mg Oral BID   warfarin  2 mg Oral ONCE-1600   Warfarin - Pharmacist Dosing Inpatient   Does not apply q1600  Azithromycin IV Ceftriaxone IV  Assessment: 80 yo male on Warfarin PTA for Afib admitted with PNA, pleural effusion Home warfarin regimen (see 01/16/21 office note also): Warfarin 2 mg DAILY with 2.5 mg Every other day Hgb 8.8  plt 332  6/29 INR 2.0      Goal of Therapy:  INR 2-3 Monitor platelets by anticoagulation protocol: Yes   Plan:  INR therapeutic.  Per Med Rec it appears pt took 2 mg & 2.5 mg yesterday 6/28.  Will order Warfarin 2 mg x 1 dose today. DDI: Metronidazole x 1 dose and Azithromycin daily. Will f/u INR daily and CBC per protocol  Alayziah Tangeman A 02/28/2021,11:11  AM

## 2021-02-28 NOTE — ED Triage Notes (Addendum)
PT arrived via [facility] from home with reports of shortness of breath that started tonight. Pt reports he woke up short of breath. PT wears 4L [place] continuously. Pt is not sure why he is on oxygen. Per EMS pt was 83% on 4L when fire department arrived. PT unable to tolerate CPAP with EMS-pt able to tolerate NRB mask.  Pt c/o lower abd pain. Pt c/o shortness of breath without chest pain. Pt does report productive cough at times as well.

## 2021-02-28 NOTE — H&P (Addendum)
PATIENT NAME: Samuel Mcknight    MR#:  [id number]  DATE OF BIRTH:  [birth year]  DATE OF ADMISSION:  02/28/2021  PRIMARY CARE PHYSICIAN: Kirk Ruths, MD   Patient is coming from: Home  REQUESTING/REFERRING PHYSICIAN: Ward, Delice Bison, DO  CHIEF COMPLAINT:   Chief Complaint  Patient presents with   Shortness of Breath    HISTORY OF PRESENT ILLNESS:  Samuel Mcknight is a 80 y.o. Caucasian male with medical history significant for chronic atrial fibrillation, hypertension, prediabetes, thrombocytopenia and anxiety, who presented to the emergency room with acute onset of worsening dyspnea since last night with associated cough productive of yellowish sputum as well has mild tactile fever and cold chills.  The patient denies any wheezing.  No chest pain or palpitations.  No nausea or vomiting or abdominal pain.  He has been vaccinated twice for COVID-19 and received the 2 more boost t.  He has had thoracentesis both for the last 1 was about 7 to 8 months.  No dysuria, oliguria or hematuria or flank pain.  ED Course: When he came to the ER, blood pressure was 152/62 temperature 100 heart rate of 107 respiratory to 32, pulse oximetry of 100% on 100% percent nonrebreather and later  98% on 6L of O2 by nasal cannula.  Labs revealed BUN was 36 and creatinine 1.59 close to baseline.  CMP was otherwise unremarkable.  BNP was 1033.9 with high-sensitivity troponin of 17.  Lactic acid was 2.7 and later 1.2 and procalcitonin 0.170.  CBC showed leukocytosis of 14.3 with anemia with hemoglobin of 8.8 and hematocrit 26.9 close to previous levels.  ABG showed pH 7.44, HCO3 of 25.8, PCO2 38 and PO2 of 98 and O2 sat 97.9% initially on 100% FiO2.  Imaging: Chest x-ray showed cardiomegaly and small right pleural effusion and mild pulmonary edema. Chest CTA revealed the following, No evidence of pulmonary embolus.   Large right pleural effusion and small left pleural effusion. Airspace  disease in the right lower lobe and right middle lobe could reflect atelectasis or pneumonia. Patchy opacities in the left lower lobe concerning for early pneumonia.   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 Recommend semi-annual imaging followup by CTA or MRA and referral to cardiothoracic surgery if not already obtained  Aortic atherosclerosis and emphysema.  The patient was given 1 g of p.o. Tylenol, IV cefepime and vancomycin and Flagyl, 2 L bolus of IV lactated Ringer followed 150 mill per hour.  He will be admitted to a medical monitored bed for further evaluation and management.  PAST MEDICAL HISTORY:   Past Medical History:  Diagnosis Date   Atrial fibrillation ([place])    GAD (generalized anxiety disorder)    Hypertension    Prediabetes     PAST SURGICAL HISTORY:   Past Surgical History:  Procedure Laterality Date   CARDIOVERSION N/A 02/24/2020   Procedure: CARDIOVERSION;  Surgeon: Isaias Cowman, MD;  Location: [facility] ORS;  Service: Cardiovascular;  Laterality: N/A;   supraorbital artery repair  2005   TOOTH EXTRACTION      SOCIAL HISTORY:   Social History   Tobacco Use   Smoking status: Former    Pack years: 0.00    Types: Cigarettes   Smokeless tobacco: Former    Quit date: 2006  Substance Use Topics   Alcohol use: Not Currently    Comment: quit     FAMILY HISTORY:  Family History  Problem Relation Age of Onset   Lung cancer Father     DRUG ALLERGIES:   Allergies  Allergen Reactions   Bupropion Swelling   Amiodarone Rash    Broke out in dark blue blotches   Diltiazem Rash    REVIEW OF SYSTEMS:   ROS As per history of present illness. All pertinent systems were reviewed above. Constitutional, HEENT, cardiovascular, respiratory, GI, GU, musculoskeletal, neuro, psychiatric, endocrine, integumentary and hematologic systems were reviewed and are otherwise  negative/unremarkable except for positive findings mentioned above in the HPI.   MEDICATIONS AT HOME:   Prior to Admission medications   Medication Sig Start Date End Date Taking? Authorizing Provider  ALPRAZolam Duanne Moron) 0.5 MG tablet Take 0.5 mg by mouth 2 (two) times daily as needed for anxiety.  02/11/19  Yes [provider]  digoxin (LANOXIN) 0.125 MG tablet Take 0.125 mg by mouth daily. 03/09/20 03/09/21 Yes [provider]  eltrombopag (PROMACTA) 25 MG tablet Take 1 tablet (25 mg total) by mouth daily. Take on an empty stomach 1 hour before a meal or 2 hours after 10/10/20  Yes Cammie Sickle, MD  furosemide (LASIX) 20 MG tablet Take 20 mg by mouth daily. 05/25/19 02/28/21 Yes [provider]  metoprolol tartrate (LOPRESSOR) 100 MG tablet Take 100 mg by mouth 2 (two) times daily. 03/07/20 03/07/21 Yes [provider]  warfarin (COUMADIN) 2 MG tablet Take 2 mg by mouth daily. 08/07/20  Yes [provider]  warfarin (COUMADIN) 2.5 MG tablet Take 2.5 mg by mouth every other day. 02/07/21  Yes [provider]      VITAL SIGNS:  Blood pressure 109/60, pulse 82, temperature (!) 101.7 F (38.7 C), temperature source Rectal, resp. rate 20, height 5\' 7"  (1.702 m), weight 63.5 kg, SpO2 98 %.  PHYSICAL EXAMINATION:  Physical Exam  GENERAL:  80 y.o.-year-old Caucasian male patient lying in the bed with mild respiratory stress with conversational dyspnea.Marland Kitchen  EYES: Pupils equal, round, reactive to light and accommodation. No scleral icterus. Extraocular muscles intact.  HEENT: Head atraumatic, normocephalic. Oropharynx and nasopharynx clear.  NECK:  Supple, no jugular venous distention. No thyroid enlargement, no tenderness.  LUNGS: Diminished bibasal and right mid lung zone with associated crackles. CARDIOVASCULAR: Regular rate and rhythm, S1, S2 normal. No murmurs, rubs, or gallops.  ABDOMEN: Soft, nondistended, nontender. Bowel sounds present. No  organomegaly or mass.  EXTREMITIES: No pedal edema, cyanosis, or clubbing.  NEUROLOGIC: Cranial nerves II through XII are intact. Muscle strength 5/5 in all extremities. Sensation intact. Gait not checked.  PSYCHIATRIC: The patient is alert and oriented x 3.  Normal affect and good eye contact. SKIN: No obvious rash, lesion, or ulcer.   LABORATORY PANEL:   CBC Recent Labs  Lab 02/28/21 0101  WBC 14.4*  14.3*  HGB 8.7*  8.8*  HCT 27.0*  26.9*  PLT 340  332   ------------------------------------------------------------------------------------------------------------------  Chemistries  Recent Labs  Lab 02/28/21 0101  NA 136  K 4.8  CL 102  CO2 27  GLUCOSE 124*  BUN 36*  CREATININE 1.59*  CALCIUM 9.2  AST 33  ALT 17  ALKPHOS 95  BILITOT 1.1   ------------------------------------------------------------------------------------------------------------------  Cardiac Enzymes No results for input(s): TROPONINI in the last 168 hours. ------------------------------------------------------------------------------------------------------------------  RADIOLOGY:  CT Angio Chest PE W and/or Wo Contrast  Result Date: 02/28/2021 CLINICAL DATA:  Shortness of breath EXAM: CT ANGIOGRAPHY CHEST WITH CONTRAST TECHNIQUE: Multidetector CT imaging of the chest was performed  using the standard protocol during bolus administration of intravenous contrast. Multiplanar CT image reconstructions and MIPs were obtained to evaluate the vascular anatomy. CONTRAST:  44mL OMNIPAQUE IOHEXOL 350 MG/ML SOLN COMPARISON:  11/07/2020 FINDINGS: Cardiovascular: No filling defects in the pulmonary arteries to suggest pulmonary emboli. Cardiomegaly. Aneurysmal dilatation of the ascending thoracic aorta measures 4.7 cm. Mildly dilated main pulmonary artery measures 4.1 cm. Coronary artery and aortic calcifications. Mediastinum/Nodes: No mediastinal, hilar, or axillary adenopathy. Trachea and esophagus are  unremarkable. Thyroid unremarkable. Lungs/Pleura: Mild emphysema. Large right pleural effusion and small left pleural effusion. Airspace disease in the right lower lobe and right middle lobe could reflect atelectasis or pneumonia. Patchy ground-glass peripheral opacities in the left lower lobe. Upper Abdomen: Imaging into the upper abdomen demonstrates no acute findings. Musculoskeletal: Chest wall soft tissues are unremarkable. No acute bony abnormality. Review of the MIP images confirms the above findings. IMPRESSION: No evidence of pulmonary embolus. Large right pleural effusion and small left pleural effusion. Airspace disease in the right lower lobe and right middle lobe could reflect atelectasis or pneumonia. Patchy opacities in the left lower lobe concerning for early pneumonia. 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 Recommend semi-annual imaging followup by CTA or MRA and referral to cardiothoracic surgery if not already obtained. This recommendation follows 2010 ACCF/AHA/AATS/ACR/ASA/SCA/SCAI/SIR/STS/SVM Guidelines for the Diagnosis and Management of Patients With Thoracic Aortic Disease. Circulation. 2010; 121: D176-H607. Aortic aneurysm NOS (ICD10-I71.9) Aortic Atherosclerosis (ICD10-I70.0) and Emphysema (ICD10-J43.9). Electronically Signed   By: Rolm Baptise M.D.   On: 02/28/2021 02:52   DG Chest Portable 1 View  Result Date: 02/28/2021 CLINICAL DATA:  Shortness of breath EXAM: PORTABLE CHEST 1 VIEW COMPARISON:  10/27/2020 FINDINGS: Small right pleural effusion with basilar atelectasis. Mild cardiomegaly. Right-greater-than-left mild interstitial opacity. IMPRESSION: Cardiomegaly, small right pleural effusion and mild pulmonary edema. Electronically Signed   By: Ulyses Jarred M.D.   On: 02/28/2021 01:20      IMPRESSION AND PLAN:  Active Problems:   Multifocal pneumonia  1.  Multifocal pneumonia with subsequent  sepsis is manifested by tachycardia and tachypnea as well as leukocytosis with associated recurrent right pleural effusion.  The patient has mild pulmonary edema that could be associated with especially with elevated BNP.  The patient may have acute on chronic diastolic CHF.  Last EF was more than 55% with aortic stenosis, aortic insufficiency, aortic root dilatation and bicuspid aortic valve. That the patient will be admitted to a medical monitored bed. - We will continue IV antibiotic therapy with IV Rocephin and Zithromax. - We will follow blood and sputum culture. - We will check urine pneumonia antigens. - The patient will be diuresed with IV Lasix - We will follow lactic acid level.  2.  Acute hypoxic respiratory failure secondary to his pneumonia. - O2 protocol will be followed. - Management otherwise as above.  3.  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 - We will continue p.o. Coumadin, digoxin, Lopressor and follow INR.  4.  Essential hypertension. - We will continue p.o. Lopressor.  5.  Anxiety. - Continue Xanax.  6.  Chronic thrombocytopenia. - We will continue his Promacta.  DVT prophylaxis: Continue Coumadin.   Code Status: full code. Family Communication:  The plan of care was discussed in details with the patient (and family). I answered all questions. The patient agreed to proceed with the above mentioned plan. Further management will depend upon hospital course.  Disposition Plan: Back to previous home environment Consults called: none. All the records are reviewed and case discussed with ED provider.  Status is: Inpatient  Remains inpatient appropriate because:Ongoing diagnostic testing needed not appropriate for outpatient work up, Unsafe d/c plan, IV treatments appropriate due to intensity of illness or inability to take PO, and Inpatient level of care appropriate due to severity of illness  Dispo: The  patient is from: Home              Anticipated d/c is to: Home              Patient currently is not medically stable to d/c.   Difficult to place patient No  TOTAL TIME TAKING CARE OF THIS PATIENT: 55 minutes.    Christel Mormon M.D on 02/28/2021 at 6:07 AM  [facility]   From 7 PM-7 AM, contact night-coverage [email or web address]  CC: Primary care physician; Kirk Ruths, MD

## 2021-02-28 NOTE — Progress Notes (Signed)
CODE SEPSIS - PHARMACY COMMUNICATION  **Broad Spectrum Antibiotics should be administered within 1 hour of Sepsis diagnosis**  Time Code Sepsis Called/Page Received: 0113  Antibiotics Ordered: Cefepime, Flagyl, and Vancomycin  Time of 1st antibiotic administration: 0136  Renda Rolls, PharmD, MBA 02/28/2021 1:21 AM

## 2021-02-28 NOTE — ED Notes (Signed)
Metoprolol not given at this time d/t bp 116/56

## 2021-02-28 NOTE — Progress Notes (Signed)
PROGRESS NOTE    Samuel Mcknight  BJS:[id number] DOB: Jan 06, 1941 DOA: 02/28/2021 PCP: Kirk Ruths, MD   Assessment & Plan:   Active Problems:   Multifocal pneumonia  Sepsis: met criteria w/ tachycardia, tachypnea, leukocytosis and pneumonia. Continue on IV rocephin, azithromycin. Blood cxs NGTD. Urine cx is pending   Multifocal pneumonia: with associated recurrent right pleural effusion & a small left pleural effusion. Continue on IV rocephin, azithromycin. Continue on IV lasix. Monitor I/Os. Blood cxs NGTD. No hx of lung cancer  Acute hypoxic respiratory failure: secondary to pneumonia & pleural effusion. Continue on IV abxs, IV lasix, bronchodilators. Monitor I/Os. Continue on supplemental oxygen and wean as tolerated. CTA chest was neg for PE but shows large right pleural effusion & small left pleural effusion. 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 Will continue to monitor   Macrocytic anemia: will check B12 and folate level   Hx of ascending thoracic aortic aneurysm: will need semi-annual imaging w/ f/u in CTA or MRA as an outpatient   HTN: continue on metoprolol  Anxiety: severity unknown. Continue on xanax   Chronic thrombocytopenia: continue on home dose of promacta     DVT prophylaxis: coumadin  Code Status:  full  Family Communication:  Disposition Plan:  depends on PT/OT recs (not consulted yet)  Level of care: Med-Surg  Status is: Inpatient  Remains inpatient appropriate because:Unsafe d/c plan, IV treatments appropriate due to intensity of illness or inability to take PO, and Inpatient level of care appropriate due to severity of illness  Dispo: The patient is from: Home              Anticipated d/c is to: Home vs SNF              Patient currently is not medically stable to d/c.    Difficult to place patient: unclear        Consultants:    Procedures:   Antimicrobials: rocephin, azithromycin   Subjective: Pt c/o shortness of breath   Objective: Vitals:   02/28/21 0328 02/28/21 0530 02/28/21 0630 02/28/21 0659  BP:  108/62 (!) 112/54   Pulse:  70 76   Resp:  (!) 21 (!) 21   Temp:    97.6 F (36.4 C)  TempSrc:    Oral  SpO2: 98% 93% 96%   Weight:      Height:        Intake/Output Summary (Last 24 hours) at 02/28/2021 0856 Last data filed at 02/28/2021 0835 Gross per 24 hour  Intake 2649.91 ml  Output 275 ml  Net 2374.91 ml   Filed Weights   02/28/21 0053  Weight: 63.5 kg    Examination:  General exam: Appears calm and comfortable  Respiratory system: diminished breath sounds b/l R>L Cardiovascular system: irregularly irregular. No rubs, gallops or clicks.  Gastrointestinal system: Abdomen is nondistended, soft and nontender. Normal bowel sounds heard. Central nervous system: Alert and oriented. Moves all extremities  Psychiatry: Judgement and insight appear normal. Mood & affect appropriate.     Data Reviewed: I have personally reviewed following labs and imaging studies  CBC: Recent Labs  Lab 02/28/21 0101  WBC 14.4*  14.3*  NEUTROABS 9.6*  HGB 8.7*  8.8*  HCT 27.0*  26.9*  MCV 101.1*  101.5*  PLT 340  607   Basic Metabolic Panel:  Recent Labs  Lab 02/28/21 0101  NA 136  K 4.8  CL 102  CO2 27  GLUCOSE 124*  BUN 36*  CREATININE 1.59*  CALCIUM 9.2   GFR: Estimated Creatinine Clearance: 33.3 mL/min (A) (by C-G formula based on SCr of 1.59 mg/dL (H)). Liver Function Tests: Recent Labs  Lab 02/28/21 0101  AST 33  ALT 17  ALKPHOS 95  BILITOT 1.1  PROT 8.5*  ALBUMIN 3.8   No results for input(s): LIPASE, AMYLASE in the last 168 hours. No results for input(s): AMMONIA in the last 168 hours. Coagulation Profile: Recent Labs  Lab 02/28/21 0316  INR 2.0*   Cardiac Enzymes: No results for input(s):  CKTOTAL, CKMB, CKMBINDEX, TROPONINI in the last 168 hours. BNP (last 3 results) No results for input(s): PROBNP in the last 8760 hours. HbA1C: No results for input(s): HGBA1C in the last 72 hours. CBG: No results for input(s): GLUCAP in the last 168 hours. Lipid Profile: No results for input(s): CHOL, HDL, LDLCALC, TRIG, CHOLHDL, LDLDIRECT in the last 72 hours. Thyroid Function Tests: No results for input(s): TSH, T4TOTAL, FREET4, T3FREE, THYROIDAB in the last 72 hours. Anemia Panel: No results for input(s): VITAMINB12, FOLATE, FERRITIN, TIBC, IRON, RETICCTPCT in the last 72 hours. Sepsis Labs: Recent Labs  Lab 02/28/21 0101 02/28/21 0316  PROCALCITON  --  0.17  LATICACIDVEN 2.7* 1.2    Recent Results (from the past 240 hour(s))  Resp Panel by RT-PCR (Flu A&B, Covid) Nasopharyngeal Swab     Status: None   Collection Time: 02/28/21  1:01 AM   Specimen: Nasopharyngeal Swab; Nasopharyngeal(NP) swabs in vial transport medium  Result Value Ref Range Status   SARS Coronavirus 2 by RT PCR NEGATIVE NEGATIVE Final    Comment: (NOTE) SARS-CoV-2 target nucleic acids are NOT DETECTED.  The SARS-CoV-2 RNA is generally detectable in upper respiratory specimens during the acute phase of infection. The lowest concentration of SARS-CoV-2 viral copies this assay can detect is 138 copies/mL. A negative result does not preclude SARS-Cov-2 infection and should not be used as the sole basis for treatment or other patient management decisions. A negative result may occur with  improper specimen collection/handling, submission of specimen other than nasopharyngeal swab, presence of viral mutation(s) within the areas targeted by this assay, and inadequate number of viral copies(<138 copies/mL). A negative result must be combined with clinical observations, patient history, and epidemiological information. The expected result is Negative.  Fact Sheet for Patients:   EntrepreneurPulse.com.au  Fact Sheet for Healthcare Providers:  IncredibleEmployment.be  This test is no t yet approved or cleared by the [place] FDA and  has been authorized for detection and/or diagnosis of SARS-CoV-2 by FDA under an Emergency Use Authorization (EUA). This EUA will remain  in effect (meaning this test can be used) for the duration of the COVID-19 declaration under Section 564(b)(1) of the Act, 21 U.S.C.section 360bbb-3(b)(1), unless the authorization is terminated  or revoked sooner.       Influenza A by PCR NEGATIVE NEGATIVE Final   Influenza B by PCR NEGATIVE NEGATIVE Final    Comment: (NOTE) The Xpert Xpress SARS-CoV-2/FLU/RSV plus assay is intended as an aid in the diagnosis of influenza from Nasopharyngeal swab specimens and should not be used as a sole basis for treatment. Nasal washings and aspirates are unacceptable for Xpert Xpress SARS-CoV-2/FLU/RSV testing.  Fact Sheet for Patients: EntrepreneurPulse.com.au  Fact Sheet for Healthcare Providers: IncredibleEmployment.be  This test is not yet approved or cleared by the [place]  States FDA and has been authorized for detection and/or diagnosis of SARS-CoV-2 by FDA under an Emergency Use Authorization (EUA). This EUA will remain in effect (meaning this test can be used) for the duration of the COVID-19 declaration under Section 564(b)(1) of the Act, 21 U.S.C. section 360bbb-3(b)(1), unless the authorization is terminated or revoked.  Performed at [facility], [address] [place], [place] [address]   Blood Culture (routine x 2)     Status: None (Preliminary result)   Collection Time: 02/28/21  1:01 AM   Specimen: BLOOD  Result Value Ref Range Status   Specimen Description BLOOD LEFT WRIST  Final   Special Requests   Final    BOTTLES DRAWN AEROBIC AND ANAEROBIC Blood Culture adequate volume   Culture    Final    NO GROWTH < 12 HOURS Performed at [facility], [address] [place], [place] [address]    Report Status PENDING  Incomplete  Blood Culture (routine x 2)     Status: None (Preliminary result)   Collection Time: 02/28/21  1:33 AM   Specimen: BLOOD  Result Value Ref Range Status   Specimen Description BLOOD RIGHT ASSIST CONTROL  Final   Special Requests   Final    BOTTLES DRAWN AEROBIC AND ANAEROBIC Blood Culture adequate volume   Culture   Final    NO GROWTH < 12 HOURS Performed at [facility], [address] [place], [place] [address]    Report Status PENDING  Incomplete         Radiology Studies: CT Angio Chest PE W and/or Wo Contrast  Result Date: 02/28/2021 CLINICAL DATA:  Shortness of breath EXAM: CT ANGIOGRAPHY CHEST WITH CONTRAST TECHNIQUE: Multidetector CT imaging of the chest was performed using the standard protocol during bolus administration of intravenous contrast. Multiplanar CT image reconstructions and MIPs were obtained to evaluate the vascular anatomy. CONTRAST:  47m OMNIPAQUE IOHEXOL 350 MG/ML SOLN COMPARISON:  11/07/2020 FINDINGS: Cardiovascular: No filling defects in the pulmonary arteries to suggest pulmonary emboli. Cardiomegaly. Aneurysmal dilatation of the ascending thoracic aorta measures 4.7 cm. Mildly dilated main pulmonary artery measures 4.1 cm. Coronary artery and aortic calcifications. Mediastinum/Nodes: No mediastinal, hilar, or axillary adenopathy. Trachea and esophagus are unremarkable. Thyroid unremarkable. Lungs/Pleura: Mild emphysema. Large right pleural effusion and small left pleural effusion. Airspace disease in the right lower lobe and right middle lobe could reflect atelectasis or pneumonia. Patchy ground-glass peripheral opacities in the left lower lobe. Upper Abdomen: Imaging into the upper abdomen demonstrates no acute findings. Musculoskeletal: Chest wall soft tissues are unremarkable. No acute bony  abnormality. Review of the MIP images confirms the above findings. IMPRESSION: No evidence of pulmonary embolus. Large right pleural effusion and small left pleural effusion. Airspace disease in the right lower lobe and right middle lobe could reflect atelectasis or pneumonia. Patchy opacities in the left lower lobe concerning for early pneumonia. 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 Recommend semi-annual imaging followup by CTA or MRA and referral to cardiothoracic surgery if not already obtained. This recommendation follows 2010 ACCF/AHA/AATS/ACR/ASA/SCA/SCAI/SIR/STS/SVM Guidelines for the Diagnosis and Management of Patients With Thoracic Aortic Disease. Circulation. 2010; 121:: G256-L893 Aortic aneurysm NOS (ICD10-I71.9) Aortic Atherosclerosis (ICD10-I70.0) and Emphysema (ICD10-J43.9). Electronically Signed   By: KRolm BaptiseM.D.   On: 02/28/2021 02:52   DG Chest Portable 1 View  Result Date: 02/28/2021 CLINICAL DATA:  Shortness of breath EXAM: PORTABLE CHEST 1 VIEW COMPARISON:  10/27/2020  FINDINGS: Small right pleural effusion with basilar atelectasis. Mild cardiomegaly. Right-greater-than-left mild interstitial opacity. IMPRESSION: Cardiomegaly, small right pleural effusion and mild pulmonary edema. Electronically Signed   By: Ulyses Jarred M.D.   On: 02/28/2021 01:20        Scheduled Meds:  digoxin  0.125 mg Oral Daily   eltrombopag  25 mg Oral Daily   enoxaparin (LOVENOX) injection  40 mg Subcutaneous Q24H   furosemide  40 mg Intravenous Daily   metoprolol tartrate  100 mg Oral BID   warfarin  2 mg Oral Daily   warfarin  2.5 mg Oral QODAY   Continuous Infusions:  azithromycin Stopped (02/28/21 0835)   cefTRIAXone (ROCEPHIN)  IV 2 g (02/28/21 0833)     LOS: 0 days    Time spent: 34 mins     Wyvonnia Dusky, MD [facility] Pager [phone]  If 7PM-7AM, please contact  night-coverage 02/28/2021, 8:56 AM

## 2021-02-28 NOTE — ED Notes (Signed)
Unable to given 10 am meds at this time d/t meds not verified

## 2021-02-28 NOTE — ED Notes (Signed)
First set of blood cultures drawn by Francoise Schaumann

## 2021-02-28 NOTE — ED Notes (Signed)
Received call from Dr Jimmye Norman answering page, notified Dr Jimmye Norman of pt condition. Per Dr Jimmye Norman, pt to stay on NRB and 6L [place] at this time.  Pt skin color improved. 100% SpO2

## 2021-02-28 NOTE — ED Notes (Signed)
Verbal order to d/c blood cultures ordered at 0600 today. Blood cultures already drawn and sent at 0100

## 2021-02-28 NOTE — Progress Notes (Signed)
Code Sepsis tracking by eLINK 

## 2021-02-28 NOTE — Progress Notes (Signed)
PHARMACY -  BRIEF ANTIBIOTIC NOTE   Pharmacy has received consult(s) for Cefepime and Vancomycin from an ED provider.  The patient's profile has been reviewed for ht/wt/allergies/indication/available labs.    One time order(s) placed for Cefepime 2 gm and Vancomycin 1500 mg per pt wt:  63.5 kg.  Further antibiotics/pharmacy consults should be ordered by admitting physician if indicated.                       Renda Rolls, PharmD, MBA 02/28/2021 1:22 AM

## 2021-02-28 NOTE — ED Notes (Signed)
Samuel Mcknight with RT notified ABG order

## 2021-02-28 NOTE — ED Notes (Signed)
Pt visualized resting at this time, RR even and unlabored. NAD noted. Call bell in reach, stretcher locked in lowest position

## 2021-02-28 NOTE — ED Notes (Signed)
Dr. Ward at bedside.

## 2021-02-28 NOTE — ED Notes (Signed)
Patient given carton of milk as requested.

## 2021-02-28 NOTE — ED Notes (Signed)
RN to bedside to answer call light. Pt laying in bed states cannot breathe. SpO2 66% on 6L [place] with good pleth. Pt sat up in bed, placed on NRB in addition to 6L . Improvement to 96%, pt states NRB is helping. Accessory muscle use noted when breathing, pt diaphoretic Dr Jimmye Norman paged to reassess pt

## 2021-02-28 NOTE — ED Notes (Signed)
Meal tray at bedside.  

## 2021-02-28 NOTE — ED Provider Notes (Signed)
[facility] Emergency Department Provider Note  ____________________________________________   Event Date/Time   First MD Initiated Contact with Patient 02/28/21 [phone]     (approximate)  I have reviewed the triage vital signs and the nursing notes.   HISTORY  Chief Complaint Shortness of Breath    HPI Samuel Mcknight is a 80 y.o. 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  States he has been short of breath for 2 years but symptoms worsened last night.  Denies any known fevers but has had chills.  No cough, chest pain.  No nausea, vomiting or diarrhea.  No abdominal pain.  Has been vaccinated against COVID-19 and influenza.  Has had recurrent pleural effusions requiring thoracentesis x3.  Wears 4 L of oxygen at home intermittently.  Brought in on nonrebreather by EMS.        Past Medical History:  Diagnosis Date   Atrial fibrillation ([place])    GAD (generalized anxiety disorder)    Hypertension    Prediabetes     Patient Active Problem List   Diagnosis Date Noted   Normocytic anemia 03/15/2019   Anemia of chronic kidney failure, stage 3 (moderate) 03/15/2019    Past Surgical History:  Procedure Laterality Date   CARDIOVERSION N/A 02/24/2020   Procedure: CARDIOVERSION;  Surgeon: Isaias Cowman, MD;  Location: [facility] ORS;  Service: Cardiovascular;  Laterality: N/A;   supraorbital artery repair  2005   TOOTH EXTRACTION      Prior to Admission medications   Medication Sig Start Date End Date Taking? Authorizing Provider  ALPRAZolam Duanne Moron) 0.5 MG tablet Take 0.5 mg by mouth 2 (two) times daily as needed for anxiety.  02/11/19   [provider]  digoxin (LANOXIN) 0.125 MG tablet Take by mouth.  03/09/20 03/09/21  [provider]  eltrombopag (PROMACTA) 25 MG tablet Take 1 tablet (25 mg total) by mouth daily. Take on an empty stomach 1 hour before a meal or 2 hours after Patient not taking: Reported on 12/05/2020 10/10/20   Cammie Sickle, MD  furosemide (LASIX) 20 MG tablet Take 20 mg by mouth daily. 05/25/19 12/05/20  [provider]  metoprolol tartrate (LOPRESSOR) 50 MG tablet Take 50 mg by mouth 2 (two) times daily.    [provider]  warfarin (COUMADIN) 3 MG tablet Take 3-4.5 mg by mouth See admin instructions. Take 3 mg on Mon, Wed, Fri, and Sat. Take 4.5 mg on Tues, Thurs, and Sun. 08/06/18   [provider]    Allergies Bupropion, Amiodarone, and Diltiazem  Family History  Problem Relation Age of Onset   Lung cancer Father     Social History Social History   Tobacco Use   Smoking status: Former    Pack years: 0.00    Types: Cigarettes   Smokeless tobacco: Former    Quit date: 2006  Vaping Use   Vaping Use: Former   Quit date: 03/14/2005  Substance Use Topics   Alcohol use: Not Currently    Comment: quit    Drug use: Not Currently    Review of Systems Constitutional: No fever. Eyes: No visual changes. ENT: No sore throat. Cardiovascular: Denies chest pain. Respiratory: + shortness of breath. Gastrointestinal: No nausea, vomiting, diarrhea. Genitourinary: Negative for dysuria. Musculoskeletal: Negative for  back pain. Skin: Negative for rash. Neurological: Negative for focal weakness or numbness.  ____________________________________________   PHYSICAL EXAM:  VITAL SIGNS: ED Triage Vitals  Enc Vitals Group     BP 02/28/21 0055 (!) 152/62     Pulse Rate 02/28/21 0055 (!) 107     Resp 02/28/21 0055 (!) 32     Temp 02/28/21 0055 100 F (37.8 C)     Temp Source 02/28/21 0055 Oral     SpO2 02/28/21 0055 100 %     Weight 02/28/21 0053 140 lb (63.5 kg)     Height 02/28/21 0053 5\' 7"  (1.702 m)     Head Circumference  --      Peak Flow --      Pain Score 02/28/21 0053 0     Pain Loc --      Pain Edu? --      Excl. in [place]? --    CONSTITUTIONAL: Alert and oriented and responds appropriately to questions.  Elderly, thin, chronically ill-appearing, pale HEAD: Normocephalic EYES: Conjunctivae clear, pupils appear equal, EOM appear intact, no conjunctival pallor, no scleral icterus ENT: normal nose; moist mucous membranes NECK: Supple, normal ROM CARD: Irregularly irregular and minimally tachycardic; S1 and S2 appreciated; + systolic murmur RESP: Diminished aeration diffusely.  Patient tachypneic and speaking short sentences.  Currently satting 100% on nonrebreather.  No rhonchi, rales or wheezing.  No significant distress. ABD/GI: Normal bowel sounds; non-distended; soft, non-tender, no rebound, no guarding, no peritoneal signs, no hepatosplenomegaly RECTAL: No gross blood or melena.  Guaiac negative. BACK: The back appears normal EXT: Normal ROM in all joints; no deformity noted, no edema; no cyanosis, no calf tenderness or calf swelling SKIN: Normal color for age and race; warm; no rash on exposed skin NEURO: Moves all extremities equally PSYCH: The patient's mood and manner are appropriate.  ____________________________________________   LABS (all labs ordered are listed, but only abnormal results are displayed)  Labs Reviewed  BASIC METABOLIC PANEL - Abnormal; Notable for the following components:      Result Value   Glucose, Bld 124 (*)    BUN 36 (*)    Creatinine, Ser 1.59 (*)    GFR, Estimated 44 (*)    All other components within normal limits  CBC - Abnormal; Notable for the following components:   WBC 14.3 (*)    RBC 2.65 (*)    Hemoglobin 8.8 (*)    HCT 26.9 (*)    MCV 101.5 (*)    RDW 18.8 (*)    All other components within normal limits  LACTIC ACID, PLASMA - Abnormal; Notable for the following components:   Lactic Acid, Venous 2.7 (*)    All other components within normal  limits  BRAIN NATRIURETIC PEPTIDE - Abnormal; Notable for the following components:   B Natriuretic Peptide 1,033.9 (*)    All other components within normal limits  HEPATIC FUNCTION PANEL - Abnormal; Notable for the following components:   Total Protein 8.5 (*)    Bilirubin, Direct 0.3 (*)    All other components within normal limits  CBC WITH DIFFERENTIAL/PLATELET - Abnormal; Notable for the following components:   WBC 14.4 (*)    RBC 2.67 (*)    Hemoglobin 8.7 (*)    HCT 27.0 (*)    MCV 101.1 (*)    RDW 18.8 (*)    Neutro Abs 9.6 (*)    Monocytes Absolute 2.3 (*)    All other components within normal limits  RESP PANEL BY RT-PCR (FLU A&B, COVID) [id number]  CULTURE, BLOOD (ROUTINE X 2)  CULTURE, BLOOD (ROUTINE X 2)  URINE CULTURE  BLOOD GAS, ARTERIAL  LACTIC ACID, PLASMA  URINALYSIS, COMPLETE (UACMP) WITH MICROSCOPIC  PROCALCITONIN  PROTIME-INR  APTT  TYPE AND SCREEN  TROPONIN I (HIGH SENSITIVITY)  TROPONIN I (HIGH SENSITIVITY)   ____________________________________________  EKG   Date: 02/28/2021 00:56  Rate: 108  Rhythm: Atrial fibrillation  QRS Axis: normal  Intervals: Prolonged QT interval  ST/T Wave abnormalities: Nonspecific T wave changes  Conduction Disutrbances: none  Narrative Interpretation: Atrial fibrillation, nonspecific T wave changes, PVCs, prolonged QT interval, artifact    ____________________________________________  RADIOLOGY I, Salem Mastrogiovanni, personally viewed and evaluated these images (plain radiographs) as part of my medical decision making, as well as reviewing the written report by the radiologist.  ED MD interpretation: CT imaging shows no PE.  Patient has a large right pleural effusion, small left pleural effusion, multifocal pneumonia.  Official radiology report(s): CT Angio Chest PE W and/or Wo Contrast  Result Date: 02/28/2021 CLINICAL DATA:  Shortness of breath EXAM: CT ANGIOGRAPHY CHEST WITH CONTRAST TECHNIQUE: Multidetector CT  imaging of the chest was performed using the standard protocol during bolus administration of intravenous contrast. Multiplanar CT image reconstructions and MIPs were obtained to evaluate the vascular anatomy. CONTRAST:  36mL OMNIPAQUE IOHEXOL 350 MG/ML SOLN COMPARISON:  11/07/2020 FINDINGS: Cardiovascular: No filling defects in the pulmonary arteries to suggest pulmonary emboli. Cardiomegaly. Aneurysmal dilatation of the ascending thoracic aorta measures 4.7 cm. Mildly dilated main pulmonary artery measures 4.1 cm. Coronary artery and aortic calcifications. Mediastinum/Nodes: No mediastinal, hilar, or axillary adenopathy. Trachea and esophagus are unremarkable. Thyroid unremarkable. Lungs/Pleura: Mild emphysema. Large right pleural effusion and small left pleural effusion. Airspace disease in the right lower lobe and right middle lobe could reflect atelectasis or pneumonia. Patchy ground-glass peripheral opacities in the left lower lobe. Upper Abdomen: Imaging into the upper abdomen demonstrates no acute findings. Musculoskeletal: Chest wall soft tissues are unremarkable. No acute bony abnormality. Review of the MIP images confirms the above findings. IMPRESSION: No evidence of pulmonary embolus. Large right pleural effusion and small left pleural effusion. Airspace disease in the right lower lobe and right middle lobe could reflect atelectasis or pneumonia. Patchy opacities in the left lower lobe concerning for early pneumonia. 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 Recommend semi-annual imaging followup by CTA or MRA and referral to cardiothoracic surgery if not already obtained. This recommendation follows 2010 ACCF/AHA/AATS/ACR/ASA/SCA/SCAI/SIR/STS/SVM Guidelines for the Diagnosis and Management of Patients With Thoracic Aortic Disease. Circulation. 2010; 121: J449-E010. Aortic aneurysm NOS (ICD10-I71.9) Aortic Atherosclerosis  (ICD10-I70.0) and Emphysema (ICD10-J43.9). Electronically Signed   By: Rolm Baptise M.D.   On: 02/28/2021 02:52   DG Chest Portable 1 View  Result Date: 02/28/2021 CLINICAL DATA:  Shortness of breath EXAM: PORTABLE CHEST 1 VIEW COMPARISON:  10/27/2020 FINDINGS: Small right pleural effusion with basilar atelectasis. Mild cardiomegaly. Right-greater-than-left mild interstitial opacity. IMPRESSION: Cardiomegaly, small right pleural effusion and mild pulmonary edema. Electronically Signed   By: Ulyses Jarred M.D.   On: 02/28/2021 01:20    ____________________________________________   PROCEDURES  Procedure(s) performed (including Critical Care):  Procedures  CRITICAL CARE Performed by: Cyril Mourning Amisadai Woodford   Total critical care time: 65 minutes  Critical care time was exclusive of separately billable procedures and treating other patients.  Critical care was necessary to treat or prevent imminent or life-threatening deterioration.  Critical care was  time spent personally by me on the following activities: development of treatment plan with patient and/or surrogate as well as nursing, discussions with consultants, evaluation of patient's response to treatment, examination of patient, obtaining history from patient or surrogate, ordering and performing treatments and interventions, ordering and review of laboratory studies, ordering and review of radiographic studies, pulse oximetry and re-evaluation of patient's condition.  ____________________________________________   INITIAL IMPRESSION / ASSESSMENT AND PLAN / ED COURSE  As part of my medical decision making, I reviewed the following data within the [place] History obtained from family, Nursing notes reviewed and incorporated, Labs reviewed , EKG interpreted , Old EKG reviewed, Old chart reviewed, Radiograph reviewed , Discussed with admitting physician , and Notes from prior ED visits         Patient here with  increasing shortness of breath.  Found to be febrile.  Differential includes pneumonia, COVID-19, influenza, recurrent pleural effusion, PE, ACS, CHF exacerbation.  Will give broad-spectrum antibiotics, obtain labs, cultures, urine, chest x-ray.  Patient will need admission given at this time he meets SIRS criteria.  ED PROGRESS  Chest x-ray shows possible mild pulmonary edema however I am more concerned about infectious etiology.  We will proceed with CTA of the chest to also rule out PE.  Patient appears to have multifocal pneumonia, large right pleural effusion, small left pleural effusion on CT scan.  No signs of pulmonary edema or PE.  Lactic is elevated at 2.7.  White count of 14 with left shift.  Given he meets sepsis criteria, will give 30 mL/kg IV fluid bolus.  He has been given broad-spectrum antibiotics.  Patient's ABG is reassuring.  He has now been weaned down to 6 L nasal cannula and reports he is feeling better.  He is COVID and flu negative.  Will admit.  3:50 AM Discussed patient's case with hospitalist, Dr. Sidney Ace.  I have recommended admission and patient (and family if present) agree with this plan. Admitting physician will place admission orders.   I reviewed all nursing notes, vitals, pertinent previous records and reviewed/interpreted all EKGs, lab and urine results, imaging (as available).  ____________________________________________   FINAL CLINICAL IMPRESSION(S) / ED DIAGNOSES  Final diagnoses:  Multifocal pneumonia  Recurrent right pleural effusion     ED Discharge Orders     None       *Please note:  Neizan Debruhl was evaluated in Emergency Department on 02/28/2021 for the symptoms described in the history of present illness. He was evaluated in the context of the global COVID-19 pandemic, which necessitated consideration that the patient might be at risk for infection with the SARS-CoV-2 virus that causes COVID-19. Institutional protocols and algorithms that  pertain to the evaluation of patients at risk for COVID-19 are in a state of rapid change based on information released by regulatory bodies including the CDC and federal and state organizations. These policies and algorithms were followed during the patient's care in the ED.  Some ED evaluations and interventions may be delayed as a result of limited staffing during and the pandemic.*   Note:  This document was prepared using Dragon voice recognition software and may include unintentional dictation errors.    Kristoff Coonradt, Delice Bison, DO 02/28/21 [phone]

## 2021-03-01 ENCOUNTER — Inpatient Hospital Stay: Payer: Medicare Other

## 2021-03-01 DIAGNOSIS — J9611 Chronic respiratory failure with hypoxia: Secondary | ICD-10-CM

## 2021-03-01 LAB — BODY FLUID CELL COUNT WITH DIFFERENTIAL
Eos, Fluid: 0 %
Lymphs, Fluid: 69 %
Monocyte-Macrophage-Serous Fluid: 21 %
Neutrophil Count, Fluid: 10 %
Total Nucleated Cell Count, Fluid: 457 cu mm

## 2021-03-01 LAB — CBC
HCT: 21.4 % — ABNORMAL LOW (ref 39.0–52.0)
Hemoglobin: 7.3 g/dL — ABNORMAL LOW (ref 13.0–17.0)
MCH: 33.3 pg (ref 26.0–34.0)
MCHC: 34.1 g/dL (ref 30.0–36.0)
MCV: 97.7 fL (ref 80.0–100.0)
Platelets: 267 10*3/uL (ref 150–400)
RBC: 2.19 MIL/uL — ABNORMAL LOW (ref 4.22–5.81)
RDW: 18.7 % — ABNORMAL HIGH (ref 11.5–15.5)
WBC: 12.1 10*3/uL — ABNORMAL HIGH (ref 4.0–10.5)
nRBC: 0 % (ref 0.0–0.2)

## 2021-03-01 LAB — BASIC METABOLIC PANEL
Anion gap: 7 (ref 5–15)
BUN: 34 mg/dL — ABNORMAL HIGH (ref 8–23)
CO2: 29 mmol/L (ref 22–32)
Calcium: 8.7 mg/dL — ABNORMAL LOW (ref 8.9–10.3)
Chloride: 101 mmol/L (ref 98–111)
Creatinine, Ser: 1.55 mg/dL — ABNORMAL HIGH (ref 0.61–1.24)
GFR, Estimated: 45 mL/min — ABNORMAL LOW (ref >60)
Glucose, Bld: 98 mg/dL (ref 70–99)
Potassium: 4.3 mmol/L (ref 3.5–5.1)
Sodium: 137 mmol/L (ref 135–145)

## 2021-03-01 LAB — PROTIME-INR
INR: 2.2 — ABNORMAL HIGH (ref 0.8–1.2)
Prothrombin Time: 24.7 seconds — ABNORMAL HIGH (ref 11.4–15.2)

## 2021-03-01 LAB — LEGIONELLA PNEUMOPHILA SEROGP 1 UR AG: L. pneumophila Serogp 1 Ur Ag: NEGATIVE

## 2021-03-01 LAB — URINE CULTURE

## 2021-03-01 LAB — FOLATE: Folate: 12.9 ng/mL (ref >5.9)

## 2021-03-01 LAB — VITAMIN B12: Vitamin B-12: 518 pg/mL (ref 180–914)

## 2021-03-01 MED ORDER — CALCIUM CARBONATE ANTACID 500 MG PO CHEW
800.0000 mg | CHEWABLE_TABLET | Freq: Three times a day (TID) | ORAL | Status: DC | PRN
  Administered 2021-03-01 – 2021-03-02 (×2): 800 mg via ORAL
  Filled 2021-03-01 (×2): qty 4

## 2021-03-01 MED ORDER — WARFARIN SODIUM 3 MG PO TABS
3.0000 mg | ORAL_TABLET | Freq: Once | ORAL | Status: DC
  Filled 2021-03-01: qty 1

## 2021-03-01 NOTE — Procedures (Signed)
Interventional Radiology Procedure Note  Procedure: US guided right thoracentesis  Complications: None  Estimated Blood Loss: None  Findings: 1.9 L of amber fluid removed from right pleural space. Post CXR pending.  Venetia Night. Kathlene Cote, M.D Pager:  [phone]

## 2021-03-01 NOTE — Progress Notes (Signed)
PROGRESS NOTE    Samuel Mcknight  TMH:[id number] DOB: 1941-05-13 DOA: 02/28/2021 PCP: Kirk Ruths, MD   Assessment & Plan:   Active Problems:   Multifocal pneumonia  Sepsis: met criteria w/ tachycardia, tachypnea, leukocytosis and pneumonia. Continue on IV rocephin, azithromycin. Blood cxs NGTD. Urine cx shows containment. Resolved   Multifocal pneumonia: with associated recurrent right pleural effusion & a small left pleural effusion. Continue on IV azithromycin, rocephin. Continue on IV lasix. Right thoracentesis w/ fluid studies ordered. Warfarin was held today. Monitor I/Os. Blood cxs NGTD. No hx of lung cancer  Acute hypoxic respiratory failure: secondary to pneumonia & pleural effusion. Continue on IV abxs, IV lasix, bronchodilators. Monitor I/Os. Neg approx 1.9L. Continue on supplemental oxygen and wean as tolerated. CTA chest was neg for PE but shows large right pleural effusion & small left pleural effusion. Right thoracentesis w/ fluid studies ordered. Warfarin was held but may need FFP or vitamin K.   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 Continue on metoprolol, digoxin. Will hold warfarin for pending right thoracentesis   Leukocytosis: likely secondary to infection. 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 Cr is trending down slightly from day prior.  Macrocytic anemia: B12 and folate are both WNL. Will transfuse if Hb <7.0  Hx of ascending thoracic aortic aneurysm: will need semi-annual imaging w/ f/u in CTA or MRA as an outpatient   HTN: continue on metoprolol   Anxiety: severity unknown. Continue on home dose of xanax  Chronic thrombocytopenia: currently WNL. Continue on home dose of promacta      DVT prophylaxis: coumadin  Code Status:  full  Family Communication:  Disposition Plan:  depends on PT/OT recs (not consulted yet)  Level of care: Med-Surg  Status is: Inpatient  Remains  inpatient appropriate because:Unsafe d/c plan, IV treatments appropriate due to intensity of illness or inability to take PO, and Inpatient level of care appropriate due to severity of illness  Dispo: The patient is from: Home              Anticipated d/c is to: Home vs SNF              Patient currently is not medically stable to d/c.   Difficult to place patient: unclear        Consultants:    Procedures:   Antimicrobials: rocephin, azithromycin   Subjective: Pt still c/o shortness of breath   Objective: Vitals:   02/28/21 2110 02/28/21 2130 03/01/21 0018 03/01/21 0346  BP: 116/64 125/69 118/71 117/67  Pulse: 87 82 81 67  Resp:  _0 Temp:   97.8 F (36.6 C) 97.6 F (36.4 C)  TempSrc:   Oral Oral  SpO2:  97% 100% 100%  Weight:      Height:        Intake/Output Summary (Last 24 hours) at 03/01/2021 0818 Last data filed at 03/01/2021 0700 Gross per 24 hour  Intake 250 ml  Output 2150 ml  Net -1900 ml   Filed Weights   02/28/21 0053  Weight: 63.5 kg    Examination:  General exam: Appears uncomfortable  Respiratory system: decreased breath sounds b/l R>L Cardiovascular system: irregularly irregular. No gallops or clicks  Gastrointestinal system: Abd is soft, NT, ND & hypoactive bowel sounds  Central nervous system: Alert and oriented. Moves all extremities  Psychiatry: Judgement and insight appear normal. Appropriate mood and affect.  Data Reviewed: I have personally reviewed following labs and imaging studies  CBC: Recent Labs  Lab 02/28/21 0101 03/01/21 0608  WBC 14.4*  14.3* 12.1*  NEUTROABS 9.6*  --   HGB 8.7*  8.8* 7.3*  HCT 27.0*  26.9* 21.4*  MCV 101.1*  101.5* 97.7  PLT 340  332 801   Basic Metabolic Panel: Recent Labs  Lab 02/28/21 0101 03/01/21 0608  NA 136 137  K 4.8 4.3  CL 102 101  CO2 27 29  GLUCOSE 124* 98  BUN 36* 34*  CREATININE 1.59* 1.55*  CALCIUM 9.2 8.7*   GFR: Estimated Creatinine Clearance: 34.1  mL/min (A) (by C-G formula based on SCr of 1.55 mg/dL (H)). Liver Function Tests: Recent Labs  Lab 02/28/21 0101  AST 33  ALT 17  ALKPHOS 95  BILITOT 1.1  PROT 8.5*  ALBUMIN 3.8   No results for input(s): LIPASE, AMYLASE in the last 168 hours. No results for input(s): AMMONIA in the last 168 hours. Coagulation Profile: Recent Labs  Lab 02/28/21 0316 03/01/21 0608  INR 2.0* 2.2*   Cardiac Enzymes: No results for input(s): CKTOTAL, CKMB, CKMBINDEX, TROPONINI in the last 168 hours. BNP (last 3 results) No results for input(s): PROBNP in the last 8760 hours. HbA1C: No results for input(s): HGBA1C in the last 72 hours. CBG: No results for input(s): GLUCAP in the last 168 hours. Lipid Profile: No results for input(s): CHOL, HDL, LDLCALC, TRIG, CHOLHDL, LDLDIRECT in the last 72 hours. Thyroid Function Tests: No results for input(s): TSH, T4TOTAL, FREET4, T3FREE, THYROIDAB in the last 72 hours. Anemia Panel: Recent Labs    03/01/21 0608  FOLATE 12.9   Sepsis Labs: Recent Labs  Lab 02/28/21 0101 02/28/21 0316  PROCALCITON  --  0.17  LATICACIDVEN 2.7* 1.2    Recent Results (from the past 240 hour(s))  Resp Panel by RT-PCR (Flu A&B, Covid) Nasopharyngeal Swab     Status: None   Collection Time: 02/28/21  1:01 AM   Specimen: Nasopharyngeal Swab; Nasopharyngeal(NP) swabs in vial transport medium  Result Value Ref Range Status   SARS Coronavirus 2 by RT PCR NEGATIVE NEGATIVE Final    Comment: (NOTE) SARS-CoV-2 target nucleic acids are NOT DETECTED.  The SARS-CoV-2 RNA is generally detectable in upper respiratory specimens during the acute phase of infection. The lowest concentration of SARS-CoV-2 viral copies this assay can detect is 138 copies/mL. A negative result does not preclude SARS-Cov-2 infection and should not be used as the sole basis for treatment or other patient management decisions. A negative result may occur with  improper specimen collection/handling,  submission of specimen other than nasopharyngeal swab, presence of viral mutation(s) within the areas targeted by this assay, and inadequate number of viral copies(<138 copies/mL). A negative result must be combined with clinical observations, patient history, and epidemiological information. The expected result is Negative.  Fact Sheet for Patients:  EntrepreneurPulse.com.au  Fact Sheet for Healthcare Providers:  IncredibleEmployment.be  This test is no t yet approved or cleared by the [place] FDA and  has been authorized for detection and/or diagnosis of SARS-CoV-2 by FDA under an Emergency Use Authorization (EUA). This EUA will remain  in effect (meaning this test can be used) for the duration of the COVID-19 declaration under Section 564(b)(1) of the Act, 21 U.S.C.section 360bbb-3(b)(1), unless the authorization is terminated  or revoked sooner.       Influenza A by PCR NEGATIVE NEGATIVE Final   Influenza B by PCR NEGATIVE NEGATIVE Final  Comment: (NOTE) The Xpert Xpress SARS-CoV-2/FLU/RSV plus assay is intended as an aid in the diagnosis of influenza from Nasopharyngeal swab specimens and should not be used as a sole basis for treatment. Nasal washings and aspirates are unacceptable for Xpert Xpress SARS-CoV-2/FLU/RSV testing.  Fact Sheet for Patients: EntrepreneurPulse.com.au  Fact Sheet for Healthcare Providers: IncredibleEmployment.be  This test is not yet approved or cleared by the [place] FDA and has been authorized for detection and/or diagnosis of SARS-CoV-2 by FDA under an Emergency Use Authorization (EUA). This EUA will remain in effect (meaning this test can be used) for the duration of the COVID-19 declaration under Section 564(b)(1) of the Act, 21 U.S.C. section 360bbb-3(b)(1), unless the authorization is terminated or revoked.  Performed at [facility], [address] [place], [place] [address]   Blood Culture (routine x 2)     Status: None (Preliminary result)   Collection Time: 02/28/21  1:01 AM   Specimen: BLOOD  Result Value Ref Range Status   Specimen Description BLOOD LEFT WRIST  Final   Special Requests   Final    BOTTLES DRAWN AEROBIC AND ANAEROBIC Blood Culture adequate volume   Culture   Final    NO GROWTH 1 DAY Performed at [facility], [address] [place], [place] [address]    Report Status PENDING  Incomplete  Blood Culture (routine x 2)     Status: None (Preliminary result)   Collection Time: 02/28/21  1:33 AM   Specimen: BLOOD  Result Value Ref Range Status   Specimen Description BLOOD RIGHT ASSIST CONTROL  Final   Special Requests   Final    BOTTLES DRAWN AEROBIC AND ANAEROBIC Blood Culture adequate volume   Culture   Final    NO GROWTH 1 DAY Performed at [facility], [address], [place], [place] [address]    Report Status PENDING  Incomplete         Radiology Studies: CT Angio Chest PE W and/or Wo Contrast  Result Date: 02/28/2021 CLINICAL DATA:  Shortness of breath EXAM: CT ANGIOGRAPHY CHEST WITH CONTRAST TECHNIQUE: Multidetector CT imaging of the chest was performed using the standard protocol during bolus administration of intravenous contrast. Multiplanar CT image reconstructions and MIPs were obtained to evaluate the vascular anatomy. CONTRAST:  24m OMNIPAQUE IOHEXOL 350 MG/ML SOLN COMPARISON:  11/07/2020 FINDINGS: Cardiovascular: No filling defects in the pulmonary arteries to suggest pulmonary emboli. Cardiomegaly. Aneurysmal dilatation of the ascending thoracic aorta measures 4.7 cm. Mildly dilated main pulmonary artery measures 4.1 cm. Coronary artery and aortic calcifications. Mediastinum/Nodes: No mediastinal, hilar, or axillary adenopathy. Trachea and esophagus are unremarkable. Thyroid unremarkable. Lungs/Pleura: Mild emphysema. Large right pleural effusion and small left  pleural effusion. Airspace disease in the right lower lobe and right middle lobe could reflect atelectasis or pneumonia. Patchy ground-glass peripheral opacities in the left lower lobe. Upper Abdomen: Imaging into the upper abdomen demonstrates no acute findings. Musculoskeletal: Chest wall soft tissues are unremarkable. No acute bony abnormality. Review of the MIP images confirms the above findings. IMPRESSION: No evidence of pulmonary embolus. Large right pleural effusion and small left pleural effusion. Airspace disease in the right lower lobe and right middle lobe could reflect atelectasis or pneumonia. Patchy opacities in the left lower lobe concerning for early pneumonia. 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 Recommend semi-annual imaging followup by CTA or MRA and referral to cardiothoracic surgery if not already obtained. This recommendation follows  2010 ACCF/AHA/AATS/ACR/ASA/SCA/SCAI/SIR/STS/SVM Guidelines for the Diagnosis and Management of Patients With Thoracic Aortic Disease. Circulation. 2010; 121: X735-H299. Aortic aneurysm NOS (ICD10-I71.9) Aortic Atherosclerosis (ICD10-I70.0) and Emphysema (ICD10-J43.9). Electronically Signed   By: Rolm Baptise M.D.   On: 02/28/2021 02:52   DG Chest Portable 1 View  Result Date: 02/28/2021 CLINICAL DATA:  Shortness of breath EXAM: PORTABLE CHEST 1 VIEW COMPARISON:  10/27/2020 FINDINGS: Small right pleural effusion with basilar atelectasis. Mild cardiomegaly. Right-greater-than-left mild interstitial opacity. IMPRESSION: Cardiomegaly, small right pleural effusion and mild pulmonary edema. Electronically Signed   By: Ulyses Jarred M.D.   On: 02/28/2021 01:20        Scheduled Meds:  digoxin  0.125 mg Oral Daily   eltrombopag  25 mg Oral Daily   furosemide  40 mg Intravenous BID   metoprolol tartrate  100 mg Oral BID   Warfarin - Pharmacist Dosing Inpatient   Does not apply  q1600   Continuous Infusions:  azithromycin Stopped (02/28/21 0835)   cefTRIAXone (ROCEPHIN)  IV Stopped (02/28/21 0906)     LOS: 1 day    Time spent: 31 mins     Wyvonnia Dusky, MD [facility] Pager [phone]  If 7PM-7AM, please contact night-coverage 03/01/2021, 8:18 AM

## 2021-03-01 NOTE — Progress Notes (Signed)
ANTICOAGULATION CONSULT NOTE - Initial Consult  Pharmacy Consult for warfarin Indication: atrial fibrillation  Allergies  Allergen Reactions   Bupropion Swelling   Amiodarone Rash    Broke out in dark blue blotches   Diltiazem Rash    Patient Measurements: Height: 5\' 7"  (170.2 cm) Weight: 63.5 kg (140 lb) IBW/kg (Calculated) : 66.1 Heparin Dosing Weight:    Vital Signs: Temp: 97.6 F (36.4 C) (06/30 0346) Temp Source: Oral (06/30 0346) BP: 117/67 (06/30 0346) Pulse Rate: 67 (06/30 0346)  Labs: Recent Labs    02/28/21 0101 02/28/21 0314 02/28/21 0316 03/01/21 0608  HGB 8.7*  8.8*  --   --  7.3*  HCT 27.0*  26.9*  --   --  21.4*  PLT 340  332  --   --  267  APTT  --   --  42*  --   LABPROT  --   --  22.3* 24.7*  INR  --   --  2.0* 2.2*  CREATININE 1.59*  --   --  1.55*  TROPONINIHS 17 21*  --   --      Estimated Creatinine Clearance: 34.1 mL/min (A) (by C-G formula based on SCr of 1.55 mg/dL (H)).   Medical History: Past Medical History:  Diagnosis Date   Atrial fibrillation (HCC)    GAD (generalized anxiety disorder)    Hypertension    Prediabetes     Medications:  Scheduled:   digoxin  0.125 mg Oral Daily   eltrombopag  25 mg Oral Daily   furosemide  40 mg Intravenous BID   metoprolol tartrate  100 mg Oral BID   Warfarin - Pharmacist Dosing Inpatient   Does not apply q1600  Azithromycin IV Ceftriaxone IV  Assessment: 80 yo male on Warfarin PTA for Afib admitted with PNA, pleural effusionHome warfarin regimen (see 01/16/21 office note also): Warfarin 2 mg DAILY with 2.5 mg Every other day Hgb 8.8  plt 332   INR Warfarin 6/29  2.0    2mg  6/30  2.2 3mg    Goal of Therapy:  INR 2-3 Monitor platelets by anticoagulation protocol: Yes   Plan:  INR therapeutic.  Per Med Rec it appears pt took 2 mg + 2.5 mg 6/28.   Will order Warfarin 3 mg x 1 dose today (lower than home dose per DDI)  DDI: Metronidazole x 1 dose and Azithromycin  daily. Will f/u INR daily and CBC per protocol  Lu Duffel, PharmD, BCPS Clinical Pharmacist 03/01/2021 12:57 PM

## 2021-03-02 LAB — BASIC METABOLIC PANEL
Anion gap: 8 (ref 5–15)
BUN: 35 mg/dL — ABNORMAL HIGH (ref 8–23)
CO2: 27 mmol/L (ref 22–32)
Calcium: 8.8 mg/dL — ABNORMAL LOW (ref 8.9–10.3)
Chloride: 101 mmol/L (ref 98–111)
Creatinine, Ser: 1.58 mg/dL — ABNORMAL HIGH (ref 0.61–1.24)
GFR, Estimated: 44 mL/min — ABNORMAL LOW (ref >60)
Glucose, Bld: 105 mg/dL — ABNORMAL HIGH (ref 70–99)
Potassium: 3.7 mmol/L (ref 3.5–5.1)
Sodium: 136 mmol/L (ref 135–145)

## 2021-03-02 LAB — CBC
HCT: 22.2 % — ABNORMAL LOW (ref 39.0–52.0)
Hemoglobin: 7.5 g/dL — ABNORMAL LOW (ref 13.0–17.0)
MCH: 33.8 pg (ref 26.0–34.0)
MCHC: 33.8 g/dL (ref 30.0–36.0)
MCV: 100 fL (ref 80.0–100.0)
Platelets: 275 10*3/uL (ref 150–400)
RBC: 2.22 MIL/uL — ABNORMAL LOW (ref 4.22–5.81)
RDW: 18.6 % — ABNORMAL HIGH (ref 11.5–15.5)
WBC: 11.3 10*3/uL — ABNORMAL HIGH (ref 4.0–10.5)
nRBC: 0 % (ref 0.0–0.2)

## 2021-03-02 LAB — PROTIME-INR
INR: 2.3 — ABNORMAL HIGH (ref 0.8–1.2)
Prothrombin Time: 25.5 seconds — ABNORMAL HIGH (ref 11.4–15.2)

## 2021-03-02 LAB — GLUCOSE, CAPILLARY: Glucose-Capillary: 175 mg/dL — ABNORMAL HIGH (ref 70–99)

## 2021-03-02 LAB — MAGNESIUM: Magnesium: 1.7 mg/dL (ref 1.7–2.4)

## 2021-03-02 MED ORDER — POTASSIUM CHLORIDE CRYS ER 20 MEQ PO TBCR
40.0000 meq | EXTENDED_RELEASE_TABLET | Freq: Once | ORAL | Status: AC
  Administered 2021-03-03: 09:00:00 40 meq via ORAL
  Filled 2021-03-02: qty 2

## 2021-03-02 MED ORDER — ENSURE ENLIVE PO LIQD
237.0000 mL | Freq: Two times a day (BID) | ORAL | Status: DC
  Administered 2021-03-03 – 2021-03-05 (×4): 237 mL via ORAL

## 2021-03-02 MED ORDER — SODIUM CHLORIDE 0.9 % IV SOLN
INTRAVENOUS | Status: DC | PRN
  Administered 2021-03-02: 10:00:00 250 mL via INTRAVENOUS

## 2021-03-02 MED ORDER — WARFARIN SODIUM 3 MG PO TABS
3.0000 mg | ORAL_TABLET | Freq: Once | ORAL | Status: AC
  Administered 2021-03-02: 18:00:00 3 mg via ORAL
  Filled 2021-03-02: qty 1

## 2021-03-02 MED ORDER — MIDODRINE HCL 5 MG PO TABS
10.0000 mg | ORAL_TABLET | Freq: Once | ORAL | Status: AC
  Administered 2021-03-02: 14:00:00 10 mg via ORAL
  Filled 2021-03-02: qty 2

## 2021-03-02 NOTE — Progress Notes (Signed)
PROGRESS NOTE    Samuel Mcknight  WVP:[id number] DOB: [birth year] DOA: 02/28/2021 PCP: Kirk Ruths, MD   Assessment & Plan:   Active Problems:   Multifocal pneumonia  Sepsis: met criteria w/ tachycardia, tachypnea, leukocytosis and pneumonia. Continue on IV rocephin, azithromycin. Blood cxs NGTD. Urine cx shows containment. Resolved   Multifocal pneumonia: with associated recurrent right pleural effusion & a small left pleural effusion. Continue on IV azithromycin, rocephin. Continue on IV lasix. S/p right thoracentesis w/ 1.9 L removed, fluid studies are pending. Respiratory status has improved today   Acute hypoxic respiratory failure: secondary to pneumonia & pleural effusion. Continue on IV abxs, IV lasix, bronchodilators. Monitor I/Os. Continue on supplemental oxygen and wean as tolerated. S/p right thoracentesis w/ 1.9 L removed, fluid studies are pending. Respiratory status has improved today   PAF: with history of aortic valve stenosis and bicuspid aortic valve. Continue on digoxin, metoprolol & restart warfarin today   Leukocytosis: likely secondary to infection. 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 Cr is labile   Macrocytic anemia: will transfuse if Hb <7.0. B12 & folate are WNL   Hx of ascending thoracic aortic aneurysm: will need semi-annual imaging w/ f/u in CTA or MRA as an outpatient   HTN: continue on BB   Anxiety: severity unknown. Continue on home dose of xanax   Chronic thrombocytopenia: WNL currently. Continue on home dose of promacta      DVT prophylaxis: coumadin  Code Status:  full  Family Communication:  Disposition Plan:  depends on PT/OT recs   Level of care: Med-Surg  Status is: Inpatient  Remains inpatient appropriate because:Unsafe d/c plan, IV treatments appropriate due to intensity of illness or inability to take PO, and Inpatient level of care appropriate due to severity of illness  Dispo:  The patient is from: Home              Anticipated d/c is to: Home vs SNF              Patient currently is not medically stable to d/c.   Difficult to place patient: unclear        Consultants:    Procedures:   Antimicrobials: rocephin, azithromycin   Subjective: Pt c/o malaise   Objective: Vitals:   03/01/21 1959 03/01/21 2014 03/02/21 0002 03/02/21 0439  BP: 96/61 138/89 109/63 113/70  Pulse: 72 87 (!) 55 66  Resp: _0 Temp: 98.1 F (36.7 C) 98.7 F (37.1 C) 98.7 F (37.1 C) 97.9 F (36.6 C)  TempSrc: Oral     SpO2: 100% 98% 100% 100%  Weight:      Height:        Intake/Output Summary (Last 24 hours) at 03/02/2021 0707 Last data filed at 03/01/2021 1626 Gross per 24 hour  Intake 200.12 ml  Output --  Net 200.12 ml   Filed Weights   02/28/21 0053  Weight: 63.5 kg    Examination:  General exam: Appears comfortable  Respiratory system: diminished breath sounds b/l Cardiovascular system: irregularly irregular. No rubs or clicks  Gastrointestinal system: Abd is soft, NT, ND & normal bowel sounds  Central nervous system: Lethargic. Moves all extremities  Psychiatry: judgement and insight appear normal. Flat mood and affect     Data Reviewed: I have personally reviewed following labs and imaging studies  CBC: Recent Labs  Lab 02/28/21 0101 03/01/21 0608 03/02/21 0525  WBC 14.4*  14.3* 12.1*  11.3*  NEUTROABS 9.6*  --   --   HGB 8.7*  8.8* 7.3* 7.5*  HCT 27.0*  26.9* 21.4* 22.2*  MCV 101.1*  101.5* 97.7 100.0  PLT 340  332 267 110   Basic Metabolic Panel: Recent Labs  Lab 02/28/21 0101 03/01/21 0608 03/02/21 0525  NA 136 137 136  K 4.8 4.3 3.7  CL 102 101 101  CO2 _0 GLUCOSE 124* 98 105*  BUN 36* 34* 35*  CREATININE 1.59* 1.55* 1.58*  CALCIUM 9.2 8.7* 8.8*  MG  --   --  1.7   GFR: Estimated Creatinine Clearance: 33.5 mL/min (A) (by C-G formula based on SCr of 1.58 mg/dL (H)). Liver Function Tests: Recent Labs   Lab 02/28/21 0101  AST 33  ALT 17  ALKPHOS 95  BILITOT 1.1  PROT 8.5*  ALBUMIN 3.8   No results for input(s): LIPASE, AMYLASE in the last 168 hours. No results for input(s): AMMONIA in the last 168 hours. Coagulation Profile: Recent Labs  Lab 02/28/21 0316 03/01/21 0608 03/02/21 0525  INR 2.0* 2.2* 2.3*   Cardiac Enzymes: No results for input(s): CKTOTAL, CKMB, CKMBINDEX, TROPONINI in the last 168 hours. BNP (last 3 results) No results for input(s): PROBNP in the last 8760 hours. HbA1C: No results for input(s): HGBA1C in the last 72 hours. CBG: No results for input(s): GLUCAP in the last 168 hours. Lipid Profile: No results for input(s): CHOL, HDL, LDLCALC, TRIG, CHOLHDL, LDLDIRECT in the last 72 hours. Thyroid Function Tests: No results for input(s): TSH, T4TOTAL, FREET4, T3FREE, THYROIDAB in the last 72 hours. Anemia Panel: Recent Labs    03/01/21 0608  VITAMINB12 518  FOLATE 12.9   Sepsis Labs: Recent Labs  Lab 02/28/21 0101 02/28/21 0316  PROCALCITON  --  0.17  LATICACIDVEN 2.7* 1.2    Recent Results (from the past 240 hour(s))  Resp Panel by RT-PCR (Flu A&B, Covid) Nasopharyngeal Swab     Status: None   Collection Time: 02/28/21  1:01 AM   Specimen: Nasopharyngeal Swab; Nasopharyngeal(NP) swabs in vial transport medium  Result Value Ref Range Status   SARS Coronavirus 2 by RT PCR NEGATIVE NEGATIVE Final    Comment: (NOTE) SARS-CoV-2 target nucleic acids are NOT DETECTED.  The SARS-CoV-2 RNA is generally detectable in upper respiratory specimens during the acute phase of infection. The lowest concentration of SARS-CoV-2 viral copies this assay can detect is 138 copies/mL. A negative result does not preclude SARS-Cov-2 infection and should not be used as the sole basis for treatment or other patient management decisions. A negative result may occur with  improper specimen collection/handling, submission of specimen other than nasopharyngeal swab,  presence of viral mutation(s) within the areas targeted by this assay, and inadequate number of viral copies(<138 copies/mL). A negative result must be combined with clinical observations, patient history, and epidemiological information. The expected result is Negative.  Fact Sheet for Patients:  EntrepreneurPulse.com.au  Fact Sheet for Healthcare Providers:  IncredibleEmployment.be  This test is no t yet approved or cleared by the [place] FDA and  has been authorized for detection and/or diagnosis of SARS-CoV-2 by FDA under an Emergency Use Authorization (EUA). This EUA will remain  in effect (meaning this test can be used) for the duration of the COVID-19 declaration under Section 564(b)(1) of the Act, 21 U.S.C.section 360bbb-3(b)(1), unless the authorization is terminated  or revoked sooner.       Influenza A by PCR NEGATIVE NEGATIVE Final   Influenza  B by PCR NEGATIVE NEGATIVE Final    Comment: (NOTE) The Xpert Xpress SARS-CoV-2/FLU/RSV plus assay is intended as an aid in the diagnosis of influenza from Nasopharyngeal swab specimens and should not be used as a sole basis for treatment. Nasal washings and aspirates are unacceptable for Xpert Xpress SARS-CoV-2/FLU/RSV testing.  Fact Sheet for Patients: EntrepreneurPulse.com.au  Fact Sheet for Healthcare Providers: IncredibleEmployment.be  This test is not yet approved or cleared by the [place] FDA and has been authorized for detection and/or diagnosis of SARS-CoV-2 by FDA under an Emergency Use Authorization (EUA). This EUA will remain in effect (meaning this test can be used) for the duration of the COVID-19 declaration under Section 564(b)(1) of the Act, 21 U.S.C. section 360bbb-3(b)(1), unless the authorization is terminated or revoked.  Performed at [facility], [address] [place], [place] [address]   Blood Culture  (routine x 2)     Status: None (Preliminary result)   Collection Time: 02/28/21  1:01 AM   Specimen: BLOOD  Result Value Ref Range Status   Specimen Description BLOOD LEFT WRIST  Final   Special Requests   Final    BOTTLES DRAWN AEROBIC AND ANAEROBIC Blood Culture adequate volume   Culture   Final    NO GROWTH 1 DAY Performed at [facility], [address], [place], [place] [address]    Report Status PENDING  Incomplete  Blood Culture (routine x 2)     Status: None (Preliminary result)   Collection Time: 02/28/21  1:33 AM   Specimen: BLOOD  Result Value Ref Range Status   Specimen Description BLOOD RIGHT ASSIST CONTROL  Final   Special Requests   Final    BOTTLES DRAWN AEROBIC AND ANAEROBIC Blood Culture adequate volume   Culture   Final    NO GROWTH 1 DAY Performed at [facility], [address], [place], [place] [address]    Report Status PENDING  Incomplete  Urine culture     Status: Abnormal   Collection Time: 02/28/21  6:56 AM   Specimen: In/Out Cath Urine  Result Value Ref Range Status   Specimen Description   Final    IN/OUT CATH URINE Performed at [facility], [address] [place], [place] [address]    Special Requests   Final    NONE Performed at [facility], [address] [place], [place] [address]    Culture MULTIPLE SPECIES PRESENT, SUGGEST RECOLLECTION (A)  Final   Report Status 03/01/2021 FINAL  Final  Body fluid culture w Gram Stain     Status: None (Preliminary result)   Collection Time: 03/01/21  4:45 PM   Specimen: PATH Cytology Pleural fluid  Result Value Ref Range Status   Specimen Description   Final    PLEURAL Performed at [facility], [address] [place], [place] [address]    Special Requests   Final    NONE Performed at [facility], [address], [place], [place] [address]    Gram Stain   Final    FEW WBC PRESENT, PREDOMINANTLY MONONUCLEAR NO ORGANISMS  SEEN Performed at [facility], [place] [address] [place], [place] [address]    Culture PENDING  Incomplete   Report Status PENDING  Incomplete         Radiology Studies: DG Chest Port 1 View  Result Date: 03/01/2021 CLINICAL DATA:  Status post right thoracentesis. EXAM: PORTABLE CHEST 1 VIEW COMPARISON:  02/28/2021 FINDINGS: Stable  cardiac enlargement. Significant diminishment in right pleural fluid volume after thoracentesis. There may be a tiny amount of pleural air at the lateral right lung base. Improved aeration of the right lower lung. Slight decrease in pulmonary edema. IMPRESSION: Decrease in right pleural fluid volume after thoracentesis with improved aeration of the right lung. Tiny amount of pleural air present adjacent to the lung at the right lung base. Slight decrease in pulmonary edema. Electronically Signed   By: Aletta Edouard M.D.   On: 03/01/2021 17:14   US THORACENTESIS ASP PLEURAL SPACE W/IMG GUIDE  Result Date: 03/01/2021 CLINICAL DATA:  Shortness of breath and moderate to large right pleural effusion. EXAM: ULTRASOUND GUIDED RIGHT THORACENTESIS COMPARISON:  None. PROCEDURE: An ultrasound guided thoracentesis was thoroughly discussed with the patient and questions answered. The benefits, risks, alternatives and complications were also discussed. The patient understands and wishes to proceed with the procedure. Written consent was obtained. Ultrasound was performed to localize and mark an adequate pocket of fluid in the right chest. The area was then prepped and draped in the normal sterile fashion. 1% Lidocaine was used for local anesthesia. Under ultrasound guidance a 6 French Safe-T-Centesis catheter was introduced. Thoracentesis was performed. The catheter was removed and a dressing applied. COMPLICATIONS: None FINDINGS: A total of approximately 1.9 L of amber fluid was removed. A fluid sample was sent for laboratory analysis. IMPRESSION: Successful ultrasound guided  right thoracentesis yielding 1.9 L of pleural fluid. Electronically Signed   By: Aletta Edouard M.D.   On: 03/01/2021 17:12        Scheduled Meds:  digoxin  0.125 mg Oral Daily   eltrombopag  25 mg Oral Daily   furosemide  40 mg Intravenous BID   metoprolol tartrate  100 mg Oral BID   Warfarin - Pharmacist Dosing Inpatient   Does not apply q1600   Continuous Infusions:  azithromycin Stopped (03/01/21 1155)   cefTRIAXone (ROCEPHIN)  IV Stopped (03/01/21 1054)     LOS: 2 days    Time spent: 30 mins     Wyvonnia Dusky, MD [facility] Pager [phone]  If 7PM-7AM, please contact night-coverage 03/02/2021, 7:07 AM

## 2021-03-02 NOTE — Evaluation (Signed)
Occupational Therapy Evaluation Patient Details Name: Samuel Mcknight MRN: [id number] DOB: 1940/11/20 Today's Date: 03/02/2021    History of Present Illness Samuel Mcknight is a 80 y.o. 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 Samuel Mcknight admitted for treatment of multifocal pneumonia with sepsis.   Clinical Impression   Samuel Mcknight was seen for OT evaluation this date. Samuel Mcknight received with spouse at bedside. Spouse assists with providing information regarding PLOF/home set-up as Samuel Mcknight is generally lethargic at start of session. Cognition improves with opportunity to engage in functional tasks. Per spouse, Samuel Mcknight was independent with all ADL management and does not require AE to support safety. Samuel Mcknight denies falls history in past 12 months. Samuel Mcknight on 4 liters of O2 at home. Samuel Mcknight currently requires MIN assist for exertional ADL management, as well as supervision for safety during functional mobility due to current functional impairments (See OT Problem List below). Samuel Mcknight educated in energy conservation strategies including pursed lip breathing, activity pacing, home/routines modifications, work simplification, AE/DME, prioritizing of meaningful occupations, and falls prevention.Samuel Mcknight verbalized understanding and would benefit from additional skilled OT services to maximize recall and carryover of learned techniques and facilitate implementation of learned techniques into daily routines. Upon discharge, recommend HHOT services to maximize Samuel Mcknight safety and return to PLOF.       Follow Up Recommendations  Home health OT;Supervision - Intermittent    Equipment Recommendations  3 in 1 bedside commode    Recommendations for Other Services       Precautions /  Restrictions Precautions Precautions: Fall Restrictions Weight Bearing Restrictions: No      Mobility Bed Mobility Overal bed mobility: Needs Assistance Bed Mobility: Supine to Sit;Sit to Supine     Supine to sit: Supervision;HOB elevated Sit to supine: HOB elevated;Supervision   General bed mobility comments: Increased time/effort to perform.    Transfers Overall transfer level: Needs assistance Equipment used: Rolling walker (2 wheeled) Transfers: Sit to/from Stand Sit to Stand: Supervision              Balance Overall balance assessment: Needs assistance Sitting-balance support: Feet supported;No upper extremity supported Sitting balance-Leahy Scale: Good Sitting balance - Comments: Steady static sitting at EOB.   Standing balance support: During functional activity;Bilateral upper extremity supported;Single extremity supported Standing balance-Leahy Scale: Good                             ADL either performed or assessed with clinical judgement   ADL Overall ADL's : Needs assistance/impaired                                       General ADL Comments: Samuel Mcknight functionally limited by generalized weakness, decreased activity tolerance, and cardiopulmonary status. Samuel Mcknight requires supervision for safety during functional mobility. Anticipate increased assist for exertional ADL management including LB dressing and bathing tasks.     Vision Baseline Vision/History: Wears glasses Wears Glasses: Distance only Patient Visual Report: No change from baseline       Perception     Praxis      Pertinent Vitals/Pain Pain Assessment: No/denies pain     Hand Dominance Right  Extremity/Trunk Assessment Upper Extremity Assessment Upper Extremity Assessment: Generalized weakness   Lower Extremity Assessment Lower Extremity Assessment: Generalized weakness       Communication Communication Communication: No difficulties   Cognition  Arousal/Alertness: Awake/alert;Lethargic Behavior During Therapy: WFL for tasks assessed/performed Overall Cognitive Status: Within Functional Limits for tasks assessed                                 General Comments: Samuel Mcknight initially sleeping, difficult to rouse at start of session. Once engaged in functional activity Samuel Mcknight becomes more alert, engaged, and appropriate during session.   General Comments  VS monitored during session. BP notably soft, 91/68 in sitting. With Samuel Mcknight standing BP noted to drop to 80/50. Samuel Mcknight remains asymptomatic t/o session. RN in room and aware.    Exercises Other Exercises Other Exercises: Samuel Mcknight/caregiver (spouse at bedside) educated on role of OT in acute setting, safe use of AE/DME for ADL management, falls prevention strategies, and routines modifications to support safety and functional indep upon hospital DC.   Shoulder Instructions      Home Living Family/patient expects to be discharged to:: Private residence Living Arrangements: Spouse/significant other Available Help at Discharge: Family;Available 24 hours/day Type of Home: House Home Access: Stairs to enter [employer] of Steps: 1 Entrance Stairs-Rails: None Home Layout: Multi-level Alternate Level Stairs-Number of Steps: 3-4 per level, tri-level home. Has rail.   Bathroom Shower/Tub: Gaffer;Door   [employer] Toilet: Standard     Home Equipment: None          Prior Functioning/Environment Level of Independence: Independent        Comments: Per Samuel Mcknight spouse, Samuel Mcknight is totally independent at baseline with no AE. Does not generally leave the home other than for Dr's apts.        OT Problem List: Decreased strength;Decreased coordination;Cardiopulmonary status limiting activity;Decreased activity tolerance;Decreased safety awareness;Impaired balance (sitting and/or standing);Decreased knowledge of use of DME or AE      OT Treatment/Interventions: Self-care/ADL  training;Therapeutic exercise;Therapeutic activities;DME and/or AE instruction;Patient/family education;Balance training;Energy conservation    OT Goals(Current goals can be found in the care plan section) Acute Rehab OT Goals Patient Stated Goal: To get stronger OT Goal Formulation: With patient Time For Goal Achievement: 03/16/21 Potential to Achieve Goals: Good  OT Frequency: Min 1X/week   Barriers to D/C:            Co-evaluation              AM-PAC OT "6 Clicks" Daily Activity     Outcome Measure Help from another person eating meals?: None Help from another person taking care of personal grooming?: None Help from another person toileting, which includes using toliet, bedpan, or urinal?: A Little Help from another person bathing (including washing, rinsing, drying)?: A Little Help from another person to put on and taking off regular upper body clothing?: A Little Help from another person to put on and taking off regular lower body clothing?: A Little 6 Click Score: 20   End of Session Equipment Utilized During Treatment: Gait belt;Rolling walker Nurse Communication: Mobility status  Activity Tolerance: Patient tolerated treatment well;No increased pain Patient left: in bed;with call bell/phone within reach;with family/visitor present  OT Visit Diagnosis: Other abnormalities of gait and mobility (R26.89);Muscle weakness (generalized) (M62.81)                Time: 8250-5397 OT Time Calculation (min): 50 min Charges:  OT General Charges $OT Visit: 1 Visit OT Evaluation $OT Eval Moderate Complexity: 1 Mod OT Treatments $Self Care/Home Management : 38-52 mins  Shara Blazing, M.S., OTR/L Ascom: [phone] 03/02/21, 12:37 PM

## 2021-03-02 NOTE — Progress Notes (Signed)
Initial Nutrition Assessment  DOCUMENTATION CODES:  Not applicable  INTERVENTION:  Continue current diet as ordered, encourage PO intake. Request new measured weight, current is stated. Ensure Enlive po BID, each supplement provides 350 kcal and 20 grams of protein  NUTRITION DIAGNOSIS:  Inadequate oral intake related to poor appetite as evidenced by per patient/family report.  GOAL:  Patient will meet greater than or equal to 90% of their needs  MONITOR:  PO intake, Supplement acceptance, Labs, Weight trends  REASON FOR ASSESSMENT:  Malnutrition Screening Tool    ASSESSMENT:  80 y.o. male with history of atrial fibrillation, HTN, prediabetes, CHF (EF 55%, 5/9), and CKD presented to ED via EMS for SOB. States he has been short of breath for 2 years but symptoms worsened last night. Chronically on 4L of O2.  Imaging in ED revealed large right pleural effusion and small left pleural effusion and also suggestive of pneumonia. Underwent thoracentesis 6/30, 1.9 L of amber fluid removed from right pleural space.   Pt being assisted to bathroom at the time of assessment and working with therapy at second attempt. Will follow-up for interview and physical exam.    Pt reported weight loss and poor appetite PTA. Reviewed weight loss, appears to have lost ~9% in the last 6 months which is significant, but not severe. 90% intake x 1 meal recorded this admission. Will add nutrition supplements to discourage further weight loss and augment intake.   Nutritionally Relevant Medications: Scheduled Meds:  furosemide  40 mg Intravenous BID   warfarin  3 mg Oral ONCE-1600   Continuous Infusions:  sodium chloride 250 mL (03/02/21 0945)   azithromycin 500 mg (03/02/21 1047)   cefTRIAXone (ROCEPHIN)  IV 2 g (03/02/21 0948)   PRN Meds: calcium carbonate, magnesium hydroxide, ondansetron  Labs Reviewed: BUN 35, creatinine 1.58  NUTRITION - FOCUSED PHYSICAL EXAM: Defer to follow-up  assessment.  Diet Order:   Diet Order             Diet Heart Room service appropriate? Yes; Fluid consistency: Thin  Diet effective now                   EDUCATION NEEDS:  No education needs have been identified at this time  Skin:  Skin Assessment: Reviewed RN Assessment  Last BM:  6/30  Height:  Ht Readings from Last 1 Encounters:  02/28/21 5\' 7"  (1.702 m)    Weight:  Wt Readings from Last 1 Encounters:  02/28/21 63.5 kg    Ideal Body Weight:  67.3 kg  BMI:  Body mass index is 21.93 kg/m.  Estimated Nutritional Needs:  Kcal:  1700-1900 kcal/d Protein:  85-100g/d Fluid:  1.8-2 L/d   Ranell Patrick, RD, LDN Clinical Dietitian Pager on [place]

## 2021-03-02 NOTE — Care Management Important Message (Signed)
Important Message  Patient Details  Name: Samuel Mcknight MRN: [id number] Date of Birth: 1940-09-27   Medicare Important Message Given:  Yes     Dannette Barbara 03/02/2021, 11:31 AM

## 2021-03-02 NOTE — TOC Initial Note (Signed)
Transition of Care [facility]) - Initial/Assessment Note    Patient Details  Name: Samuel Mcknight MRN: [id number] Date of Birth: [birth year]  Transition of Care [facility]) CM/SW Contact:    Shelbie Hutching, RN Phone Number: 03/02/2021, 3:08 PM  Clinical Narrative:                 Patient admitted to the hospital with multifocal pneumonia, history of CHF and COPD, on chronic oxygen at 4L at home.  Patient's oxygen comes from [name].  Patient is from home with his wife, wife is at the bedside today.  Patient wants to feel better, breath better and get back home.  Patient agrees to home health service and chooses [facility].  [name] with [facility] accepted referral for RN, PT, and OT.    When patient discharges if he discharges on [place] 4L he will need an order as [name] still has patient at 2L in their system.   Patient has a walker and wheelchair, he does not drive but wife does.  Patient is current with PCP and uses [name] for prescriptions.    Expected Discharge Plan: [name] to Discharge: Continued Medical Work up   Patient Goals and CMS Choice Patient states their goals for this hospitalization and ongoing recovery are:: wants to be ambulatory and to breath well CMS Medicare.gov Compare Post Acute Care list provided to:: Patient Choice offered to / list presented to : Patient  Expected Discharge Plan and Services Expected Discharge Plan: [place]   Discharge Planning Services: CM Consult Post Acute Care Choice: [place] arrangements for the past 2 months: Single Family Home                 DME Arranged: N/A DME Agency: NA       HH Arranged: RN, PT, OT HH Agency: [place] ([facility]) Date HH Agency Contacted: 03/02/21 Time [place]: 61 Representative spoke with at [place]: [name]  Prior Living Arrangements/Services Living arrangements for the past 2 months: [place] with::  Spouse Patient language and need for interpreter reviewed:: Yes Do you feel safe going back to the place where you live?: Yes      Need for Family Participation in Patient Care: Yes (Comment) (recurrent pleural effusions, COPD) Care giver support system in place?: Yes (comment) (wife) Current home services: DME (oxygen, walker, wheelchair) Criminal Activity/Legal Involvement Pertinent to Current Situation/Hospitalization: No - Comment as needed  Activities of Daily Living Home Assistive Devices/Equipment: Oxygen ADL Screening (condition at time of admission) Patient's cognitive ability adequate to safely complete daily activities?: Yes Is the patient deaf or have difficulty hearing?: No Does the patient have difficulty seeing, even when wearing glasses/contacts?: No Does the patient have difficulty concentrating, remembering, or making decisions?: Yes Patient able to express need for assistance with ADLs?: Yes Does the patient have difficulty dressing or bathing?: Yes Independently performs ADLs?: Yes (appropriate for developmental age) Does the patient have difficulty walking or climbing stairs?: Yes Weakness of Legs: Both Weakness of Arms/Hands: None  Permission Sought/Granted Permission sought to share information with : Case Manager, Family Supports, Other (comment) Permission granted to share information with : Yes, Verbal Permission Granted  Share Information with NAME: [name]  Permission granted to share info w AGENCY: [place] granted to share info w Relationship: wife     Emotional Assessment Appearance:: Appears stated age Attitude/Demeanor/Rapport: Engaged Affect (typically observed): Accepting Orientation: :  Oriented to Self, Oriented to Place, Oriented to  Time, Oriented to Situation Alcohol / Substance Use: Not Applicable Psych Involvement: No (comment)  Admission diagnosis:  Pleural effusion on right [J90] Recurrent right pleural effusion  [J90] Multifocal pneumonia [J18.9] Acute sepsis [facility]) [A41.9] Patient Active Problem List   Diagnosis Date Noted   Multifocal pneumonia 02/28/2021   Normocytic anemia 03/15/2019   Anemia of chronic kidney failure, stage 3 (moderate) 03/15/2019   PCP:  Kirk Ruths, MD Pharmacy:   [facility] [id number] - [address], [place] [address] Phone: [phone] Fax: [phone]  [facility], [place] - [address] [place] [address] Phone: [phone] Fax: [phone]     Social Determinants of Health ([place]) Interventions    Readmission Risk Interventions Readmission Risk Prevention Plan 03/02/2021  Transportation Screening Complete  PCP or Specialist Appt within 5-7 Days Complete  Home Care Screening Complete  Medication Review (RN CM) Complete  Some recent data might be hidden

## 2021-03-02 NOTE — Progress Notes (Addendum)
   03/02/21 1700  Assess: if the MEWS score is Yellow or Red  Were vital signs taken at a resting state? Yes  Focused Assessment No change from prior assessment  Does the patient meet 2 or more of the SIRS criteria? No  MEWS guidelines implemented *See Row Information* Yes  Treat  MEWS Interventions Escalated (See documentation below)  Pain Scale 0-10  Pain Score 0  Take Vital Signs  Increase Vital Sign Frequency  Yellow: Q 2hr X 2 then Q 4hr X 2, if remains yellow, continue Q 4hrs  Escalate  MEWS: Escalate Yellow: discuss with charge nurse/RN and consider discussing with provider and RRT  Notify: Charge Nurse/RN  Name of Charge Nurse/RN Notified Kathlee Nations, RN  Date Charge Nurse/RN Notified 03/02/21  Time Charge Nurse/RN Notified 1700  Notify: Provider  Provider Name/Title Dr. Eppie Gibson  Date Provider Notified 03/02/21  Time Provider Notified 1712  Notification Type Page  Notification Reason Other (Comment) (BP 100/86, HR 105-120 A.fib.  Mews 3 d/t HR.  No acute distress.  Metoprolol held today. Lasix pending.)  Provider response Other (Comment) (Per MD if Map >65 to give metoprolol. Per MD to hold lasix dose for now.)  Date of Provider Response 03/02/21  Time of Provider Response 1733  Document  Patient Outcome Other (Comment) (Will continue to monitor)  Progress note created (see row info) Yes  At 1653 HR was around 111 post exertion. Pt was standing at the side of the bed to void.  HR remained 105-120's, A.fib.  No acute distress.  Rest of VSS.  Metoprolol held this morning d/t hypotension.  Currently BP 100/86, MAP 91. Dr Jimmye Norman made aware and requested to give metoprolol now. Lasix held. Will continue to monitor.

## 2021-03-02 NOTE — Progress Notes (Signed)
ANTICOAGULATION CONSULT NOTE - Initial Consult  Pharmacy Consult for warfarin Indication: atrial fibrillation  Allergies  Allergen Reactions   Bupropion Swelling   Amiodarone Rash    Broke out in dark blue blotches   Diltiazem Rash    Patient Measurements: Height: 5\' 7"  (170.2 cm) Weight: 63.5 kg (140 lb) IBW/kg (Calculated) : 66.1 Heparin Dosing Weight:    Vital Signs: Temp: 97.9 F (36.6 C) (07/01 0439) Temp Source: Oral (06/30 1959) BP: 113/70 (07/01 0439) Pulse Rate: 66 (07/01 0439)  Labs: Recent Labs    02/28/21 0101 02/28/21 0314 02/28/21 0316 03/01/21 0608 03/02/21 0525  HGB 8.7*  8.8*  --   --  7.3* 7.5*  HCT 27.0*  26.9*  --   --  21.4* 22.2*  PLT 340  332  --   --  267 275  APTT  --   --  42*  --   --   LABPROT  --   --  22.3* 24.7* 25.5*  INR  --   --  2.0* 2.2* 2.3*  CREATININE 1.59*  --   --  1.55* 1.58*  TROPONINIHS 17 21*  --   --   --      Estimated Creatinine Clearance: 33.5 mL/min (A) (by C-G formula based on SCr of 1.58 mg/dL (H)).   Medical History: Past Medical History:  Diagnosis Date   Atrial fibrillation (HCC)    GAD (generalized anxiety disorder)    Hypertension    Prediabetes     Medications:  Scheduled:   digoxin  0.125 mg Oral Daily   eltrombopag  25 mg Oral Daily   furosemide  40 mg Intravenous BID   metoprolol tartrate  100 mg Oral BID   Warfarin - Pharmacist Dosing Inpatient   Does not apply q1600  Azithromycin IV Ceftriaxone IV  Assessment: 80 yo male on Warfarin PTA for Afib admitted with PNA, pleural effusionHome warfarin regimen (see 01/16/21 office note also): Warfarin 2 mg DAILY with 2.5 mg Every other day Hgb 8.8  plt 332   INR Warfarin 6/29  2.0    2mg  6/30  2.2 Held for thoracentesis 7/01 2.3   Goal of Therapy:  INR 2-3 Monitor platelets by anticoagulation protocol: Yes   Plan:  INR therapeutic.  Per Med Rec it appears pt took 2 mg + 2.5 mg 6/28.   Will order Warfarin 3 mg x 1 dose today  (lower than home dose per DDI)  DDI: Metronidazole x 1 dose and Azithromycin daily. Will f/u INR daily and CBC per protocol  Lu Duffel, PharmD, BCPS Clinical Pharmacist 03/02/2021 7:18 AM

## 2021-03-02 NOTE — Evaluation (Signed)
Physical Therapy Evaluation Patient Details Name: Samuel Mcknight MRN: [id number] DOB: [birth year] Today's Date: 03/02/2021   History of Present Illness  Samuel Mcknight is a 80 y.o. 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 Pt admitted for treatment of multifocal pneumonia with sepsis.  Clinical Impression  Pt awake with partner in room on 4 L O2. Cooperative and willing to work with therapy. PLOF is independent with ADLs, IADLs within the home; modified independent with RW for community distances. Transfers and ambulation require RW and supervision. Ambulated 70 feet with vitals stable throughout treatment. Resting heart rate 90s, bed mobility, transfers, ambulation 110-120 bpm, SpO2 > 90% on 4L O2 nasal cannula. Skilled PT intervention is indicated to address deficits in function, mobility, and to return to PLOF as able. Discharge recommendations are HHPT.      Follow Up Recommendations Home health PT    Equipment Recommendations  None recommended by PT    Recommendations for Other Services OT consult     Precautions / Restrictions Precautions Precautions: Fall Precaution Comments: Decreased endurance Restrictions Weight Bearing Restrictions: No      Mobility  Bed Mobility Overal bed mobility: Needs Assistance Bed Mobility: Supine to Sit     Supine to sit: Supervision Sit to supine: HOB elevated;Supervision   General bed mobility comments: Increased time/effort to perform.    Transfers Overall transfer level: Needs assistance Equipment used: Rolling walker (2 wheeled) Transfers: Sit to/from Stand Sit to Stand: Supervision         General transfer comment: Good  control  Ambulation/Gait Ambulation/Gait assistance: Supervision Gait Distance (Feet): 70 Feet Assistive device: Rolling walker (2 wheeled) Gait Pattern/deviations: Step-through pattern     General Gait Details: Decreased cadence, HR 100-110  Stairs            Wheelchair Mobility    Modified Rankin (Stroke Patients Only)       Balance Overall balance assessment: Needs assistance Sitting-balance support: Feet supported;No upper extremity supported Sitting balance-Leahy Scale: Good Sitting balance - Comments: No LOB with dyanmic reaching   Standing balance support: During functional activity;Bilateral upper extremity supported;No upper extremity supported Standing balance-Leahy Scale: Good Standing balance comment: Able to reach without holding onto RW                             Pertinent Vitals/Pain Pain Assessment: No/denies pain    Home Living Family/patient expects to be discharged to:: Private residence Living Arrangements: Spouse/significant other Available Help at Discharge: Family;Available 24 hours/day Type of Home: House Home Access: Stairs to enter Entrance Stairs-Rails: None Entrance Stairs-Number of Steps: 2 Home Layout: Multi-level Home Equipment: Walker - 2 wheels;Wheelchair - manual;Grab bars - tub/shower      Prior Function Level of Independence: Independent         Comments: Utilizes 2-wheel RW outside of home, spouse holds hand with stairs     Hand Dominance   Dominant Hand: Right    Extremity/Trunk Assessment   Upper Extremity Assessment Upper Extremity Assessment: RUE deficits/detail;LUE deficits/detail RUE Deficits / Details: MMT 4/5 Shoulder flex; 5/5 elbow flex & ext LUE Deficits / Details: MMT 4/5 Shoulder flex; 5/5 elbow flex & ext    Lower Extremity Assessment Lower Extremity  Assessment: RLE deficits/detail;LLE deficits/detail RLE Deficits / Details: MMT 4/5 hip flexion, knee extension, & plantar flexion;  5/5 dorsiflexion LLE Deficits / Details: MMT 4/5 hip flexion, knee extension, & plantar flexion; 5/5 dorsiflexion       Communication   Communication: No difficulties  Cognition Arousal/Alertness: Awake/alert Behavior During Therapy: WFL for tasks assessed/performed Overall Cognitive Status: Within Functional Limits for tasks assessed                                 General Comments: Pt initially sleeping, difficult to rouse at start of session. Once engaged in functional activity he becomes more alert, engaged, and appropriate during session.      General Comments General comments (skin integrity, edema, etc.): Vitals stable throughout session on 4L O2, Resting HR 90s, HR w/ bed mobility & transfers: 110-115    Exercises Other Exercises Other Exercises: Pt/caregiver (spouse at bedside) educated on role of OT in acute setting, safe use of AE/DME for ADL management, falls prevention strategies, and routines modifications to support safety and functional indep upon hospital DC.   Assessment/Plan    PT Assessment Patient needs continued PT services  PT Problem List Decreased strength;Decreased range of motion;Decreased activity tolerance;Decreased balance;Decreased mobility;Cardiopulmonary status limiting activity       PT Treatment Interventions      PT Goals (Current goals can be found in the Care Plan section)  Acute Rehab PT Goals Patient Stated Goal: To get stronger PT Goal Formulation: With patient Time For Goal Achievement: 03/16/21 Potential to Achieve Goals: Good    Frequency Min 2X/week   Barriers to discharge        Co-evaluation               AM-PAC PT "6 Clicks" Mobility  Outcome Measure Help needed turning from your back to your side while in a flat bed without using bedrails?: A Little Help needed moving from lying on your back to sitting on the side of a flat bed without using bedrails?: A Little Help needed moving to and from a bed  to a chair (including a wheelchair)?: A Little Help needed standing up from a chair using your arms (e.g., wheelchair or bedside chair)?: A Little Help needed to walk in hospital room?: A Little Help needed climbing 3-5 steps with a railing? : A Little 6 Click Score: 18    End of Session Equipment Utilized During Treatment: Gait belt;Oxygen (4 L) Activity Tolerance: Patient tolerated treatment well (Limited endurance) Patient left: in chair;with call bell/phone within reach;with family/visitor present;with chair alarm set;with SCD's reapplied   PT Visit Diagnosis: Muscle weakness (generalized) (M62.81)    Time: 2947-6546 PT Time Calculation (min) (ACUTE ONLY): 37 min   Charges:             [employer], SPT

## 2021-03-03 DIAGNOSIS — D539 Nutritional anemia, unspecified: Secondary | ICD-10-CM

## 2021-03-03 LAB — BASIC METABOLIC PANEL
Anion gap: 7 (ref 5–15)
BUN: 38 mg/dL — ABNORMAL HIGH (ref 8–23)
CO2: 29 mmol/L (ref 22–32)
Calcium: 8.7 mg/dL — ABNORMAL LOW (ref 8.9–10.3)
Chloride: 101 mmol/L (ref 98–111)
Creatinine, Ser: 1.64 mg/dL — ABNORMAL HIGH (ref 0.61–1.24)
GFR, Estimated: 42 mL/min — ABNORMAL LOW (ref >60)
Glucose, Bld: 94 mg/dL (ref 70–99)
Potassium: 4.2 mmol/L (ref 3.5–5.1)
Sodium: 137 mmol/L (ref 135–145)

## 2021-03-03 LAB — PREPARE RBC (CROSSMATCH)

## 2021-03-03 LAB — CBC
HCT: 20.5 % — ABNORMAL LOW (ref 39.0–52.0)
Hemoglobin: 7 g/dL — ABNORMAL LOW (ref 13.0–17.0)
MCH: 33 pg (ref 26.0–34.0)
MCHC: 34.1 g/dL (ref 30.0–36.0)
MCV: 96.7 fL (ref 80.0–100.0)
Platelets: 282 10*3/uL (ref 150–400)
RBC: 2.12 MIL/uL — ABNORMAL LOW (ref 4.22–5.81)
RDW: 18.6 % — ABNORMAL HIGH (ref 11.5–15.5)
WBC: 11.3 10*3/uL — ABNORMAL HIGH (ref 4.0–10.5)
nRBC: 0 % (ref 0.0–0.2)

## 2021-03-03 LAB — ABO/RH: ABO/RH(D): O POS

## 2021-03-03 LAB — HEMOGLOBIN AND HEMATOCRIT, BLOOD
HCT: 26.4 % — ABNORMAL LOW (ref 39.0–52.0)
Hemoglobin: 8.8 g/dL — ABNORMAL LOW (ref 13.0–17.0)

## 2021-03-03 LAB — PROTIME-INR
INR: 2.3 — ABNORMAL HIGH (ref 0.8–1.2)
Prothrombin Time: 25.5 seconds — ABNORMAL HIGH (ref 11.4–15.2)

## 2021-03-03 MED ORDER — WARFARIN SODIUM 2.5 MG PO TABS
2.5000 mg | ORAL_TABLET | Freq: Once | ORAL | Status: AC
  Administered 2021-03-03: 17:00:00 2.5 mg via ORAL
  Filled 2021-03-03: qty 1

## 2021-03-03 MED ORDER — SODIUM CHLORIDE 0.9 % BOLUS PEDS
250.0000 mL | Freq: Once | INTRAVENOUS | Status: AC
  Administered 2021-03-03: 250 mL via INTRAVENOUS

## 2021-03-03 MED ORDER — MIDODRINE HCL 5 MG PO TABS
10.0000 mg | ORAL_TABLET | Freq: Three times a day (TID) | ORAL | Status: DC
  Administered 2021-03-03 – 2021-03-05 (×7): 10 mg via ORAL
  Filled 2021-03-03 (×7): qty 2

## 2021-03-03 MED ORDER — SODIUM CHLORIDE 0.9% IV SOLUTION: Freq: Once | INTRAVENOUS | Status: AC

## 2021-03-03 MED ORDER — METOPROLOL TARTRATE 50 MG PO TABS: 50.0000 mg | ORAL_TABLET | Freq: Two times a day (BID) | ORAL | Status: DC

## 2021-03-03 NOTE — Progress Notes (Signed)
ANTICOAGULATION CONSULT NOTE  Pharmacy Consult for warfarin Indication: atrial fibrillation  Allergies  Allergen Reactions   Bupropion Swelling   Amiodarone Rash    Broke out in dark blue blotches   Diltiazem Rash    Patient Measurements: Height: 5\' 7"  (170.2 cm) Weight: 64.7 kg (142 lb 10.2 oz) IBW/kg (Calculated) : 66.1  Vital Signs: Temp: 98.1 F (36.7 C) (07/02 0819) Temp Source: Oral (07/02 0819) BP: 93/52 (07/02 0819) Pulse Rate: 72 (07/02 0832)  Labs: Recent Labs    03/01/21 0608 03/02/21 0525 03/03/21 0550  HGB 7.3* 7.5* 7.0*  HCT 21.4* 22.2* 20.5*  PLT 267 275 282  LABPROT 24.7* 25.5* 25.5*  INR 2.2* 2.3* 2.3*  CREATININE 1.55* 1.58* 1.64*     Estimated Creatinine Clearance: 32.9 mL/min (A) (by C-G formula based on SCr of 1.64 mg/dL (H)).   Medical History: Past Medical History:  Diagnosis Date   Atrial fibrillation (HCC)    GAD (generalized anxiety disorder)    Hypertension    Prediabetes     Medications:  Scheduled:   sodium chloride   Intravenous Once   digoxin  0.125 mg Oral Daily   eltrombopag  25 mg Oral Daily   feeding supplement  237 mL Oral BID BM   metoprolol tartrate  100 mg Oral BID   midodrine  10 mg Oral TID WC   Warfarin - Pharmacist Dosing Inpatient   Does not apply q1600    Assessment: 80 yo male on Warfarin PTA for Afib admitted with PNA, pleural effusion. Home warfarin regimen (see 01/16/21 office note also): warfarin 2 mg DAILY with 2.5 mg Every other day  DDIs: azithromycin, ceftriaxone  Date   INR   warfarin dose 6/29    2.0      2 mg 6/30    2.2   Held for thoracentesis 7/01   2.3   3 mg 7/02   2.3   Goal of Therapy:  INR 2-3 Monitor platelets by anticoagulation protocol: Yes   Plan:  INR therapeutic and stable: warfarin 2.5 mg po x 1 (~25% less than average home dose given DDI) f/u INR daily and CBC per protocol  Dallie Piles, PharmD, BCPS Clinical Pharmacist 03/03/2021 8:55 AM

## 2021-03-03 NOTE — Progress Notes (Signed)
Per CCMD, pt had pause in his rhythm, MD made aware , continue to monitor pt,

## 2021-03-03 NOTE — Progress Notes (Addendum)
PROGRESS NOTE    Samuel Mcknight  CBJ:[id number] DOB: 1941-07-11 DOA: 02/28/2021 PCP: Kirk Ruths, MD   Assessment & Plan:   Active Problems:   Multifocal pneumonia  Sepsis: met criteria w/ tachycardia, tachypnea, leukocytosis and pneumonia. Continue on IV rocephin, azithromycin. Blood cxs NGTD. Urine cx shows containment. Resolved   Multifocal pneumonia: w/ assoc. recurrent pleural effusion & small left pleural effusion. Continue on IV rocephin, azithromycin. Continue on bronchodilators. S/p right thoracentesis w/ 1.9L removed.   Acute on chronic hypoxic respiratory failure: secondary to pneumonia & pleural effusion. Continue on IV abxs, bronchodilators. Monitor I/Os. S/p right thoracentesis w/ 1.9 L removed. Pleural fluid cx NGTD and cytology is pending still. 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  Continue on digoxin, metoprolol and warfarin   Leukocytosis: likely secondary to infection. 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 Cr is trending up again today   Macrocytic anemia: will give 1 unit of pRBCs today. Repeat H&H 4 hours post transfusion. B12 & folate are WNL   Hx of ascending thoracic aortic aneurysm: will need semi-annual imaging w/ f/u in CTA or MRA as an outpatient   HTN: continue on metoprolol. Will hold home dose of lasix   Anxiety: severity unknown. Continue on home dose of xanax  Chronic thrombocytopenia: WNL currently. Continue on home dose of promacta      DVT prophylaxis: coumadin  Code Status:  full  Family Communication:  Disposition Plan:  likely d/c back home   Level of care: Med-Surg  Status is: Inpatient  Remains inpatient appropriate because:Unsafe d/c plan, IV treatments appropriate due to intensity of illness or inability to take PO, and Inpatient level of care appropriate due to severity of illness, will get 1 unit of pRBCs today   Dispo: The  patient is from: Home              Anticipated d/c is to: Home               Patient currently is not medically stable to d/c.   Difficult to place patient: unclear        Consultants:    Procedures:   Antimicrobials: rocephin, azithromycin   Subjective: Pt c/o fatigue   Objective: Vitals:   03/03/21 0016 03/03/21 0500 03/03/21 0553 03/03/21 0714  BP: (!) 89/53  (!) 81/40 102/71  Pulse: 73  75 78  Resp: 16  16   Temp: 97.7 F (36.5 C)  97.8 F (36.6 C)   TempSrc: Oral  Oral   SpO2: 100%  100%   Weight:  64.7 kg    Height:        Intake/Output Summary (Last 24 hours) at 03/03/2021 0718 Last data filed at 03/02/2021 1648 Gross per 24 hour  Intake 619.65 ml  Output 125 ml  Net 494.65 ml   Filed Weights   02/28/21 0053 03/02/21 1657 03/03/21 0500  Weight: 63.5 kg 62.6 kg 64.7 kg    Examination:  General exam: Appears calm & comfortable Respiratory system: decreased breath sounds b/l  Cardiovascular system: irregularly irregular. No gallops or rubs  Gastrointestinal system: Abd is soft, NT, ND & hypoactive bowel sounds  Central nervous system: Alert and oriented. Moves all extremities  Psychiatry: judgement and insight appear normal. Appropriate mood and affect     Data Reviewed: I have personally reviewed following labs and imaging studies  CBC: Recent  Labs  Lab 02/28/21 0101 03/01/21 0608 03/02/21 0525 03/03/21 0550  WBC 14.4*  14.3* 12.1* 11.3* 11.3*  NEUTROABS 9.6*  --   --   --   HGB 8.7*  8.8* 7.3* 7.5* 7.0*  HCT 27.0*  26.9* 21.4* 22.2* 20.5*  MCV 101.1*  101.5* 97.7 100.0 96.7  PLT 340  332 267 275 449   Basic Metabolic Panel: Recent Labs  Lab 02/28/21 0101 03/01/21 0608 03/02/21 0525 03/03/21 0550  NA 136 137 136 137  K 4.8 4.3 3.7 4.2  CL 102 101 101 101  CO2 '27 29 27 29  ' GLUCOSE 124* 98 105* 94  BUN 36* 34* 35* 38*  CREATININE 1.59* 1.55* 1.58* 1.64*  CALCIUM 9.2 8.7* 8.8* 8.7*  MG  --   --  1.7  --    GFR: Estimated  Creatinine Clearance: 32.9 mL/min (A) (by C-G formula based on SCr of 1.64 mg/dL (H)). Liver Function Tests: Recent Labs  Lab 02/28/21 0101  AST 33  ALT 17  ALKPHOS 95  BILITOT 1.1  PROT 8.5*  ALBUMIN 3.8   No results for input(s): LIPASE, AMYLASE in the last 168 hours. No results for input(s): AMMONIA in the last 168 hours. Coagulation Profile: Recent Labs  Lab 02/28/21 0316 03/01/21 0608 03/02/21 0525 03/03/21 0550  INR 2.0* 2.2* 2.3* 2.3*   Cardiac Enzymes: No results for input(s): CKTOTAL, CKMB, CKMBINDEX, TROPONINI in the last 168 hours. BNP (last 3 results) No results for input(s): PROBNP in the last 8760 hours. HbA1C: No results for input(s): HGBA1C in the last 72 hours. CBG: Recent Labs  Lab 03/02/21 1246  GLUCAP 175*   Lipid Profile: No results for input(s): CHOL, HDL, LDLCALC, TRIG, CHOLHDL, LDLDIRECT in the last 72 hours. Thyroid Function Tests: No results for input(s): TSH, T4TOTAL, FREET4, T3FREE, THYROIDAB in the last 72 hours. Anemia Panel: Recent Labs    03/01/21 0608  VITAMINB12 518  FOLATE 12.9   Sepsis Labs: Recent Labs  Lab 02/28/21 0101 02/28/21 0316  PROCALCITON  --  0.17  LATICACIDVEN 2.7* 1.2    Recent Results (from the past 240 hour(s))  Resp Panel by RT-PCR (Flu A&B, Covid) Nasopharyngeal Swab     Status: None   Collection Time: 02/28/21  1:01 AM   Specimen: Nasopharyngeal Swab; Nasopharyngeal(NP) swabs in vial transport medium  Result Value Ref Range Status   SARS Coronavirus 2 by RT PCR NEGATIVE NEGATIVE Final    Comment: (NOTE) SARS-CoV-2 target nucleic acids are NOT DETECTED.  The SARS-CoV-2 RNA is generally detectable in upper respiratory specimens during the acute phase of infection. The lowest concentration of SARS-CoV-2 viral copies this assay can detect is 138 copies/mL. A negative result does not preclude SARS-Cov-2 infection and should not be used as the sole basis for treatment or other patient management  decisions. A negative result may occur with  improper specimen collection/handling, submission of specimen other than nasopharyngeal swab, presence of viral mutation(s) within the areas targeted by this assay, and inadequate number of viral copies(<138 copies/mL). A negative result must be combined with clinical observations, patient history, and epidemiological information. The expected result is Negative.  Fact Sheet for Patients:  EntrepreneurPulse.com.au  Fact Sheet for Healthcare Providers:  IncredibleEmployment.be  This test is no t yet approved or cleared by the [place] FDA and  has been authorized for detection and/or diagnosis of SARS-CoV-2 by FDA under an Emergency Use Authorization (EUA). This EUA will remain  in effect (meaning this test can be  used) for the duration of the COVID-19 declaration under Section 564(b)(1) of the Act, 21 U.S.C.section 360bbb-3(b)(1), unless the authorization is terminated  or revoked sooner.       Influenza A by PCR NEGATIVE NEGATIVE Final   Influenza B by PCR NEGATIVE NEGATIVE Final    Comment: (NOTE) The Xpert Xpress SARS-CoV-2/FLU/RSV plus assay is intended as an aid in the diagnosis of influenza from Nasopharyngeal swab specimens and should not be used as a sole basis for treatment. Nasal washings and aspirates are unacceptable for Xpert Xpress SARS-CoV-2/FLU/RSV testing.  Fact Sheet for Patients: EntrepreneurPulse.com.au  Fact Sheet for Healthcare Providers: IncredibleEmployment.be  This test is not yet approved or cleared by the [place] FDA and has been authorized for detection and/or diagnosis of SARS-CoV-2 by FDA under an Emergency Use Authorization (EUA). This EUA will remain in effect (meaning this test can be used) for the duration of the COVID-19 declaration under Section 564(b)(1) of the Act, 21 U.S.C. section 360bbb-3(b)(1), unless the  authorization is terminated or revoked.  Performed at [facility], [address] [place], [place] [address]   Blood Culture (routine x 2)     Status: None (Preliminary result)   Collection Time: 02/28/21  1:01 AM   Specimen: BLOOD  Result Value Ref Range Status   Specimen Description BLOOD LEFT WRIST  Final   Special Requests   Final    BOTTLES DRAWN AEROBIC AND ANAEROBIC Blood Culture adequate volume   Culture   Final    NO GROWTH 3 DAYS Performed at [facility], [address] [place], [place] [address]    Report Status PENDING  Incomplete  Blood Culture (routine x 2)     Status: None (Preliminary result)   Collection Time: 02/28/21  1:33 AM   Specimen: BLOOD  Result Value Ref Range Status   Specimen Description BLOOD RIGHT ASSIST CONTROL  Final   Special Requests   Final    BOTTLES DRAWN AEROBIC AND ANAEROBIC Blood Culture adequate volume   Culture   Final    NO GROWTH 3 DAYS Performed at [facility], [address], [place], [place] [address]    Report Status PENDING  Incomplete  Urine culture     Status: Abnormal   Collection Time: 02/28/21  6:56 AM   Specimen: In/Out Cath Urine  Result Value Ref Range Status   Specimen Description   Final    IN/OUT CATH URINE Performed at [facility], [address], [place], [place] [address]    Special Requests   Final    NONE Performed at [facility], [address] [place], [place] [address]    Culture MULTIPLE SPECIES PRESENT, SUGGEST RECOLLECTION (A)  Final   Report Status 03/01/2021 FINAL  Final  Body fluid culture w Gram Stain     Status: None (Preliminary result)   Collection Time: 03/01/21  4:45 PM   Specimen: PATH Cytology Pleural fluid  Result Value Ref Range Status   Specimen Description   Final    PLEURAL Performed at [facility], [address] [place], [place] [address]    Special Requests   Final    NONE Performed at [facility], [address] [place], [place] [address]    Gram Stain   Final    FEW WBC PRESENT, PREDOMINANTLY MONONUCLEAR NO ORGANISMS SEEN    Culture   Final    NO GROWTH < 12 HOURS Performed at [facility],  [address] [place], [place] [address]    Report Status PENDING  Incomplete         Radiology Studies: DG Chest Port 1 View  Result Date: 03/01/2021 CLINICAL DATA:  Status post right thoracentesis. EXAM: PORTABLE CHEST 1 VIEW COMPARISON:  02/28/2021 FINDINGS: Stable cardiac enlargement. Significant diminishment in right pleural fluid volume after thoracentesis. There may be a tiny amount of pleural air at the lateral right lung base. Improved aeration of the right lower lung. Slight decrease in pulmonary edema. IMPRESSION: Decrease in right pleural fluid volume after thoracentesis with improved aeration of the right lung. Tiny amount of pleural air present adjacent to the lung at the right lung base. Slight decrease in pulmonary edema. Electronically Signed   By: Aletta Edouard M.D.   On: 03/01/2021 17:14   US THORACENTESIS ASP PLEURAL SPACE W/IMG GUIDE  Result Date: 03/01/2021 CLINICAL DATA:  Shortness of breath and moderate to large right pleural effusion. EXAM: ULTRASOUND GUIDED RIGHT THORACENTESIS COMPARISON:  None. PROCEDURE: An ultrasound guided thoracentesis was thoroughly discussed with the patient and questions answered. The benefits, risks, alternatives and complications were also discussed. The patient understands and wishes to proceed with the procedure. Written consent was obtained. Ultrasound was performed to localize and mark an adequate pocket of fluid in the right chest. The area was then prepped and draped in the normal sterile fashion. 1% Lidocaine was used for local anesthesia. Under ultrasound guidance a 6 French Safe-T-Centesis catheter was introduced. Thoracentesis was performed. The catheter was removed and a dressing applied. COMPLICATIONS: None FINDINGS:  A total of approximately 1.9 L of amber fluid was removed. A fluid sample was sent for laboratory analysis. IMPRESSION: Successful ultrasound guided right thoracentesis yielding 1.9 L of pleural fluid. Electronically Signed   By: Aletta Edouard M.D.   On: 03/01/2021 17:12        Scheduled Meds:  sodium chloride   Intravenous Once   sodium chloride  250 mL Intravenous Once   digoxin  0.125 mg Oral Daily   eltrombopag  25 mg Oral Daily   feeding supplement  237 mL Oral BID BM   furosemide  40 mg Intravenous BID   metoprolol tartrate  100 mg Oral BID   midodrine  10 mg Oral TID WC   potassium chloride  40 mEq Oral Once   Warfarin - Pharmacist Dosing Inpatient   Does not apply q1600   Continuous Infusions:  sodium chloride Stopped (03/02/21 1323)   azithromycin Stopped (03/02/21 1247)   cefTRIAXone (ROCEPHIN)  IV Stopped (03/02/21 1018)     LOS: 3 days    Time spent: 30 mins     Wyvonnia Dusky, MD [facility] Pager [phone]  If 7PM-7AM, please contact night-coverage 03/03/2021, 7:18 AM

## 2021-03-04 DIAGNOSIS — R001 Bradycardia, unspecified: Secondary | ICD-10-CM

## 2021-03-04 LAB — BPAM RBC
Blood Product Expiration Date: 202208052359
ISSUE DATE / TIME: 202207021107
Unit Type and Rh: 5100

## 2021-03-04 LAB — CBC
HCT: 24.2 % — ABNORMAL LOW (ref 39.0–52.0)
Hemoglobin: 8.2 g/dL — ABNORMAL LOW (ref 13.0–17.0)
MCH: 31.8 pg (ref 26.0–34.0)
MCHC: 33.9 g/dL (ref 30.0–36.0)
MCV: 93.8 fL (ref 80.0–100.0)
Platelets: 283 10*3/uL (ref 150–400)
RBC: 2.58 MIL/uL — ABNORMAL LOW (ref 4.22–5.81)
RDW: 18.2 % — ABNORMAL HIGH (ref 11.5–15.5)
WBC: 12 10*3/uL — ABNORMAL HIGH (ref 4.0–10.5)
nRBC: 0 % (ref 0.0–0.2)

## 2021-03-04 LAB — BASIC METABOLIC PANEL
Anion gap: 8 (ref 5–15)
BUN: 42 mg/dL — ABNORMAL HIGH (ref 8–23)
CO2: 26 mmol/L (ref 22–32)
Calcium: 8.4 mg/dL — ABNORMAL LOW (ref 8.9–10.3)
Chloride: 100 mmol/L (ref 98–111)
Creatinine, Ser: 1.53 mg/dL — ABNORMAL HIGH (ref 0.61–1.24)
GFR, Estimated: 46 mL/min — ABNORMAL LOW (ref >60)
Glucose, Bld: 105 mg/dL — ABNORMAL HIGH (ref 70–99)
Potassium: 4.5 mmol/L (ref 3.5–5.1)
Sodium: 134 mmol/L — ABNORMAL LOW (ref 135–145)

## 2021-03-04 LAB — TYPE AND SCREEN
ABO/RH(D): O POS
ABO/RH(D): O POS
Antibody Screen: NEGATIVE
Antibody Screen: NEGATIVE
Unit division: 0

## 2021-03-04 LAB — ACID FAST SMEAR (AFB, MYCOBACTERIA): Acid Fast Smear: NEGATIVE

## 2021-03-04 LAB — PROTIME-INR
INR: 2.6 — ABNORMAL HIGH (ref 0.8–1.2)
Prothrombin Time: 28.1 seconds — ABNORMAL HIGH (ref 11.4–15.2)

## 2021-03-04 MED ORDER — WARFARIN SODIUM 1 MG PO TABS
1.0000 mg | ORAL_TABLET | Freq: Once | ORAL | Status: AC
  Administered 2021-03-04: 1 mg via ORAL
  Filled 2021-03-04: qty 1

## 2021-03-04 MED ORDER — METOPROLOL TARTRATE 25 MG PO TABS
25.0000 mg | ORAL_TABLET | Freq: Two times a day (BID) | ORAL | Status: DC
  Administered 2021-03-04 – 2021-03-05 (×2): 25 mg via ORAL
  Filled 2021-03-04 (×3): qty 1

## 2021-03-04 NOTE — Progress Notes (Signed)
PROGRESS NOTE    Samuel Mcknight  MRN:[id number] DOB: 11/09/1940 DOA: 02/28/2021 PCP: Anderson, Marshall W, MD   Assessment & Plan:   Active Problems:   Multifocal pneumonia  Sepsis: met criteria w/ tachycardia, tachypnea, leukocytosis and pneumonia. Continue on IV rocephin, azithromycin. Blood cxs NGTD. Urine cx shows containment. Resolved   Multifocal pneumonia: w/ assoc. recurrent pleural effusion & small left pleural effusion. Completed abx course. Continue on bronchodilators. S/p right thoracentesis w/ 1.9L removed.   Acute on chronic hypoxic respiratory failure: secondary to pneumonia & pleural effusion. Completed abx course. Continue on bronchodilators. Monitor I/Os. S/p right thoracentesis w/ 1.9 L removed. Pleural fluid cx NGTD and cytology is pending still. Uses 4L [place] at home   PAF: with history of aortic valve stenosis and bicuspid aortic valve.  Continue on digoxin, warfain and reduced dose of metoprolol secondary to cardiac pauses which were asymptomatic   Bradycardia: intermittent, asymptomatic & w/ intermittent pauses. Possibly secondary to high dose of metoprolol. Reduced metoprolol to 25 mg BID. Will monitor   Leukocytosis: labile. 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 Cr is labile    Macrocytic anemia: s/p 1 unit of pRBCs transfused. H&H are labile. No indication for another transfusion currently   Hx of ascending thoracic aortic aneurysm: will need semi-annual imaging w/ f/u in CTA or MRA as an outpatient   HTN: continue on a reduced dose of metoprolol secondary to cardiac pauses that were asymptomatic. Continue to hold lasix   Anxiety: severity unknown. Continue on home dose of xanax  Chronic thrombocytopenia: WNL still. Continue on home dose of promacta      DVT prophylaxis: coumadin  Code Status:  full  Family Communication: discussed pt's care w/ pt's wife at bedside and answered her questions  Disposition Plan:   likely d/c back home   Level of care: Med-Surg  Status is: Inpatient  Remains inpatient appropriate because:Unsafe d/c plan, IV treatments appropriate due to intensity of illness or inability to take PO, and Inpatient level of care appropriate due to severity of illness, will monitor for cardiac pauses while on reduced dose of metoprolol. Can likely d/c in 24 hours   Dispo: The patient is from: Home              Anticipated d/c is to: Home               Patient currently is not medically stable to d/c.   Difficult to place patient: unclear        Consultants:    Procedures:   Antimicrobials:  Subjective: Pt c/o malaise   Objective: Vitals:   03/03/21 1552 03/03/21 1950 03/04/21 0047 03/04/21 0410  BP: (!) 110/52 130/62 (!) 93/43 121/64  Pulse: 76 (!) 57 89 96  Resp: 16 16 15 16  Temp: 97.7 F (36.5 C) 98.3 F (36.8 C) 98.4 F (36.9 C) 98.2 F (36.8 C)  TempSrc: Oral Oral  Oral  SpO2: 100% 100% 99% 99%  Weight:      Height:        Intake/Output Summary (Last 24 hours) at 03/04/2021 0711 Last data filed at 03/03/2021 1818 Gross per 24 hour  Intake 2150 ml  Output --  Net 2150 ml   Filed Weights   02/28/21 0053 03/02/21 1657 03/03/21 0500  Weight: 63.5 kg 62.6 kg 64.7 kg    Examination:  General exam: Appears comfortable  Respiratory system: diminished breath sounds b/l   Cardiovascular system:   irregularly irregular. No rubs or clicks  Gastrointestinal system: Abd is soft, NT, ND & normal bowel sounds  Central nervous system: Alert and oriented. Moves all extremities  Psychiatry: judgement and insight appear normal. Appropriate mood and affect    Data Reviewed: I have personally reviewed following labs and imaging studies  CBC: Recent Labs  Lab 02/28/21 0101 03/01/21 0608 03/02/21 0525 03/03/21 0550 03/03/21 1845 03/04/21 0436  WBC 14.4*  14.3* 12.1* 11.3* 11.3*  --  12.0*  NEUTROABS 9.6*  --   --   --   --   --   HGB 8.7*  8.8* 7.3* 7.5*  7.0* 8.8* 8.2*  HCT 27.0*  26.9* 21.4* 22.2* 20.5* 26.4* 24.2*  MCV 101.1*  101.5* 97.7 100.0 96.7  --  93.8  PLT 340  332 267 275 282  --  283   Basic Metabolic Panel: Recent Labs  Lab 02/28/21 0101 03/01/21 0608 03/02/21 0525 03/03/21 0550 03/04/21 0436  NA 136 137 136 137 134*  K 4.8 4.3 3.7 4.2 4.5  CL 102 101 101 101 100  CO2 27 29 27 29 26  GLUCOSE 124* 98 105* 94 105*  BUN 36* 34* 35* 38* 42*  CREATININE 1.59* 1.55* 1.58* 1.64* 1.53*  CALCIUM 9.2 8.7* 8.8* 8.7* 8.4*  MG  --   --  1.7  --   --    GFR: Estimated Creatinine Clearance: 35.2 mL/min (A) (by C-G formula based on SCr of 1.53 mg/dL (H)). Liver Function Tests: Recent Labs  Lab 02/28/21 0101  AST 33  ALT 17  ALKPHOS 95  BILITOT 1.1  PROT 8.5*  ALBUMIN 3.8   No results for input(s): LIPASE, AMYLASE in the last 168 hours. No results for input(s): AMMONIA in the last 168 hours. Coagulation Profile: Recent Labs  Lab 02/28/21 0316 03/01/21 0608 03/02/21 0525 03/03/21 0550 03/04/21 0436  INR 2.0* 2.2* 2.3* 2.3* 2.6*   Cardiac Enzymes: No results for input(s): CKTOTAL, CKMB, CKMBINDEX, TROPONINI in the last 168 hours. BNP (last 3 results) No results for input(s): PROBNP in the last 8760 hours. HbA1C: No results for input(s): HGBA1C in the last 72 hours. CBG: Recent Labs  Lab 03/02/21 1246  GLUCAP 175*   Lipid Profile: No results for input(s): CHOL, HDL, LDLCALC, TRIG, CHOLHDL, LDLDIRECT in the last 72 hours. Thyroid Function Tests: No results for input(s): TSH, T4TOTAL, FREET4, T3FREE, THYROIDAB in the last 72 hours. Anemia Panel: No results for input(s): VITAMINB12, FOLATE, FERRITIN, TIBC, IRON, RETICCTPCT in the last 72 hours.  Sepsis Labs: Recent Labs  Lab 02/28/21 0101 02/28/21 0316  PROCALCITON  --  0.17  LATICACIDVEN 2.7* 1.2    Recent Results (from the past 240 hour(s))  Resp Panel by RT-PCR (Flu A&B, Covid) Nasopharyngeal Swab     Status: None   Collection Time: 02/28/21   1:01 AM   Specimen: Nasopharyngeal Swab; Nasopharyngeal(NP) swabs in vial transport medium  Result Value Ref Range Status   SARS Coronavirus 2 by RT PCR NEGATIVE NEGATIVE Final    Comment: (NOTE) SARS-CoV-2 target nucleic acids are NOT DETECTED.  The SARS-CoV-2 RNA is generally detectable in upper respiratory specimens during the acute phase of infection. The lowest concentration of SARS-CoV-2 viral copies this assay can detect is 138 copies/mL. A negative result does not preclude SARS-Cov-2 infection and should not be used as the sole basis for treatment or other patient management decisions. A negative result may occur with  improper specimen collection/handling, submission of specimen other than nasopharyngeal   swab, presence of viral mutation(s) within the areas targeted by this assay, and inadequate number of viral copies(<138 copies/mL). A negative result must be combined with clinical observations, patient history, and epidemiological information. The expected result is Negative.  Fact Sheet for Patients:  https://www.fda.gov/media/152166/download  Fact Sheet for Healthcare Providers:  https://www.fda.gov/media/152162/download  This test is no t yet approved or cleared by the [place] FDA and  has been authorized for detection and/or diagnosis of SARS-CoV-2 by FDA under an Emergency Use Authorization (EUA). This EUA will remain  in effect (meaning this test can be used) for the duration of the COVID-19 declaration under Section 564(b)(1) of the Act, 21 U.S.C.section 360bbb-3(b)(1), unless the authorization is terminated  or revoked sooner.       Influenza A by PCR NEGATIVE NEGATIVE Final   Influenza B by PCR NEGATIVE NEGATIVE Final    Comment: (NOTE) The Xpert Xpress SARS-CoV-2/FLU/RSV plus assay is intended as an aid in the diagnosis of influenza from Nasopharyngeal swab specimens and should not be used as a sole basis for treatment. Nasal washings and aspirates  are unacceptable for Xpert Xpress SARS-CoV-2/FLU/RSV testing.  Fact Sheet for Patients: https://www.fda.gov/media/152166/download  Fact Sheet for Healthcare Providers: https://www.fda.gov/media/152162/download  This test is not yet approved or cleared by the [place] FDA and has been authorized for detection and/or diagnosis of SARS-CoV-2 by FDA under an Emergency Use Authorization (EUA). This EUA will remain in effect (meaning this test can be used) for the duration of the COVID-19 declaration under Section 564(b)(1) of the Act, 21 U.S.C. section 360bbb-3(b)(1), unless the authorization is terminated or revoked.  Performed at [facility], [address] [place], [place] [address]   Blood Culture (routine x 2)     Status: None (Preliminary result)   Collection Time: 02/28/21  1:01 AM   Specimen: BLOOD  Result Value Ref Range Status   Specimen Description BLOOD LEFT WRIST  Final   Special Requests   Final    BOTTLES DRAWN AEROBIC AND ANAEROBIC Blood Culture adequate volume   Culture   Final    NO GROWTH 3 DAYS Performed at [facility], [address] [place], [place] [address]    Report Status PENDING  Incomplete  Blood Culture (routine x 2)     Status: None (Preliminary result)   Collection Time: 02/28/21  1:33 AM   Specimen: BLOOD  Result Value Ref Range Status   Specimen Description BLOOD RIGHT ASSIST CONTROL  Final   Special Requests   Final    BOTTLES DRAWN AEROBIC AND ANAEROBIC Blood Culture adequate volume   Culture   Final    NO GROWTH 3 DAYS Performed at [facility], [address] [place], [place] [address]    Report Status PENDING  Incomplete  Urine culture     Status: Abnormal   Collection Time: 02/28/21  6:56 AM   Specimen: In/Out Cath Urine  Result Value Ref Range Status   Specimen Description   Final    IN/OUT CATH URINE Performed at [facility], [address] [place], [place] [address]    Special  Requests   Final    NONE Performed at [facility], [address] [place],  [address]    Culture MULTIPLE SPECIES PRESENT, SUGGEST RECOLLECTION (A)  Final   Report Status 03/01/2021 FINAL  Final  Acid Fast Smear (AFB)     Status: None   Collection Time: 03/01/21  4:45 PM   Specimen: PATH Cytology   Pleural fluid  Result Value Ref Range Status   AFB Specimen Processing Concentration  Final   Acid Fast Smear Negative  Final    Comment: (NOTE) Performed At: [facility] [place], [place] [id number] Rush Farmer MD YC:[id number]    Source (AFB) PLEURAL  Final    Comment: Performed at [facility], [address] [place], [place] [address]  Body fluid culture w Gram Stain     Status: None (Preliminary result)   Collection Time: 03/01/21  4:45 PM   Specimen: PATH Cytology Pleural fluid  Result Value Ref Range Status   Specimen Description   Final    PLEURAL Performed at [facility], [address], [place], [place] [address]    Special Requests   Final    NONE Performed at [facility], [address] [place], [place] [address]    Gram Stain   Final    FEW WBC PRESENT, PREDOMINANTLY MONONUCLEAR NO ORGANISMS SEEN    Culture   Final    NO GROWTH 2 DAYS Performed at [facility], [place] [address], [place], [place] [address]    Report Status PENDING  Incomplete         Radiology Studies: No results found.      Scheduled Meds:  digoxin  0.125 mg Oral Daily   eltrombopag  25 mg Oral Daily   feeding supplement  237 mL Oral BID BM   midodrine  10 mg Oral TID WC   Warfarin - Pharmacist Dosing Inpatient   Does not apply q1600   Continuous Infusions:  sodium chloride Stopped (03/02/21 1323)   azithromycin 500 mg (03/04/21 0655)   cefTRIAXone (ROCEPHIN)  IV Stopped (03/03/21 0946)     LOS: 4 days    Time spent: 34 mins     Wyvonnia Dusky, MD [facility] Pager [phone]  [phone]  If 7PM-7AM, please contact night-coverage 03/04/2021, 7:11 AM

## 2021-03-04 NOTE — Progress Notes (Signed)
ANTICOAGULATION CONSULT NOTE  Pharmacy Consult for warfarin Indication: atrial fibrillation  Allergies  Allergen Reactions   Bupropion Swelling   Amiodarone Rash    Broke out in dark blue blotches   Diltiazem Rash    Patient Measurements: Height: 5\' 7"  (170.2 cm) Weight: 64.7 kg (142 lb 10.2 oz) IBW/kg (Calculated) : 66.1  Vital Signs: Temp: 97.8 F (36.6 C) (07/03 0811) Temp Source: Oral (07/03 0410) BP: 93/61 (07/03 0812) Pulse Rate: 98 (07/03 0812)  Labs: Recent Labs    03/02/21 0525 03/03/21 0550 03/03/21 1845 03/04/21 0436  HGB 7.5* 7.0* 8.8* 8.2*  HCT 22.2* 20.5* 26.4* 24.2*  PLT 275 282  --  283  LABPROT 25.5* 25.5*  --  28.1*  INR 2.3* 2.3*  --  2.6*  CREATININE 1.58* 1.64*  --  1.53*     Estimated Creatinine Clearance: 35.2 mL/min (A) (by C-G formula based on SCr of 1.53 mg/dL (H)).   Medical History: Past Medical History:  Diagnosis Date   Atrial fibrillation (HCC)    GAD (generalized anxiety disorder)    Hypertension    Prediabetes     Medications:  Scheduled:   digoxin  0.125 mg Oral Daily   eltrombopag  25 mg Oral Daily   feeding supplement  237 mL Oral BID BM   metoprolol tartrate  25 mg Oral BID   midodrine  10 mg Oral TID WC   Warfarin - Pharmacist Dosing Inpatient   Does not apply q1600    Assessment: 80 yo male on Warfarin PTA for Afib admitted with PNA, pleural effusion. Home warfarin regimen (see 01/16/21 office note also): warfarin 2 mg DAILY with 2.5 mg Every other day  DDIs: azithromycin, ceftriaxone  Date   INR   warfarin dose 6/29    2.0      2 mg 6/30    2.2   Held for thoracentesis 7/01   2.3   3 mg 7/02   2.3   2.5 mg 7/03   2.6   1 mg    Goal of Therapy:  INR 2-3 Monitor platelets by anticoagulation protocol: Yes   Plan:  INR therapeutic and stable: warfarin 1 mg po x 1 (~50% less than average home dose given DDI) f/u INR daily and CBC per protocol  Dallie Piles, PharmD, BCPS Clinical  Pharmacist 03/04/2021 9:15 AM

## 2021-03-05 LAB — CBC
HCT: 24.8 % — ABNORMAL LOW (ref 39.0–52.0)
Hemoglobin: 8.5 g/dL — ABNORMAL LOW (ref 13.0–17.0)
MCH: 32.7 pg (ref 26.0–34.0)
MCHC: 34.3 g/dL (ref 30.0–36.0)
MCV: 95.4 fL (ref 80.0–100.0)
Platelets: 289 10*3/uL (ref 150–400)
RBC: 2.6 MIL/uL — ABNORMAL LOW (ref 4.22–5.81)
RDW: 17.8 % — ABNORMAL HIGH (ref 11.5–15.5)
WBC: 10 10*3/uL (ref 4.0–10.5)
nRBC: 0 % (ref 0.0–0.2)

## 2021-03-05 LAB — CULTURE, BLOOD (ROUTINE X 2)
Culture: NO GROWTH
Culture: NO GROWTH
Special Requests: ADEQUATE
Special Requests: ADEQUATE

## 2021-03-05 LAB — BASIC METABOLIC PANEL
Anion gap: 11 (ref 5–15)
BUN: 40 mg/dL — ABNORMAL HIGH (ref 8–23)
CO2: 24 mmol/L (ref 22–32)
Calcium: 8.9 mg/dL (ref 8.9–10.3)
Chloride: 99 mmol/L (ref 98–111)
Creatinine, Ser: 1.44 mg/dL — ABNORMAL HIGH (ref 0.61–1.24)
GFR, Estimated: 49 mL/min — ABNORMAL LOW (ref >60)
Glucose, Bld: 94 mg/dL (ref 70–99)
Potassium: 4.5 mmol/L (ref 3.5–5.1)
Sodium: 134 mmol/L — ABNORMAL LOW (ref 135–145)

## 2021-03-05 LAB — PROTIME-INR
INR: 2.5 — ABNORMAL HIGH (ref 0.8–1.2)
Prothrombin Time: 27.1 seconds — ABNORMAL HIGH (ref 11.4–15.2)

## 2021-03-05 LAB — BODY FLUID CULTURE W GRAM STAIN: Culture: NO GROWTH

## 2021-03-05 MED ORDER — WARFARIN SODIUM 2 MG PO TABS
2.0000 mg | ORAL_TABLET | Freq: Once | ORAL | Status: DC
  Filled 2021-03-05: qty 1

## 2021-03-05 MED ORDER — METOPROLOL TARTRATE 25 MG PO TABS: 25.0000 mg | ORAL_TABLET | Freq: Two times a day (BID) | ORAL | 0 refills | Status: AC

## 2021-03-05 NOTE — Discharge Summary (Addendum)
Physician Discharge Summary  Lavon Bothwell WUJ:[id number] DOB: 1941/08/16 DOA: 02/28/2021  PCP: Kirk Ruths, MD  Admit date: 02/28/2021 Discharge date: 03/05/2021  Admitted From:  home  Disposition:  home w/ home health   Recommendations for Outpatient Follow-up:  Follow up with PCP in 1-2 weeks F/u w/ cardio, Dr. Saralyn Pilar, in 1-2 weeks  Home Health: yes Equipment/Devices: 4L [place]  Discharge Condition: stable CODE STATUS: full  Diet recommendation: Heart Healthy  Brief/Interim Summary: HPI was taken from Dr. Sidney Ace: Hristopher Missildine is a 80 y.o. Caucasian male with medical history significant for chronic atrial fibrillation, hypertension, prediabetes, thrombocytopenia and anxiety, who presented to the emergency room with acute onset of worsening dyspnea since last night with associated cough productive of yellowish sputum as well has mild tactile fever and cold chills.  The patient denies any wheezing.  No chest pain or palpitations.  No nausea or vomiting or abdominal pain.  He has been vaccinated twice for COVID-19 and received the 2 more boost t.  He has had thoracentesis both for the last 1 was about 7 to 8 months.  No dysuria, oliguria or hematuria or flank pain.  ED Course: When he came to the ER, blood pressure was 152/62 temperature 100 heart rate of 107 respiratory to 32, pulse oximetry of 100% on 100% percent nonrebreather and later  98% on 6L of O2 by nasal cannula.  Labs revealed BUN was 36 and creatinine 1.59 close to baseline.  CMP was otherwise unremarkable.  BNP was 1033.9 with high-sensitivity troponin of 17.  Lactic acid was 2.7 and later 1.2 and procalcitonin 0.170.  CBC showed leukocytosis of 14.3 with anemia with hemoglobin of 8.8 and hematocrit 26.9 close to previous levels.  ABG showed pH 7.44, HCO3 of 25.8, PCO2 38 and PO2 of 98 and O2 sat 97.9% initially on 100% FiO2.   Imaging: Chest x-ray showed cardiomegaly and small right pleural effusion and mild pulmonary  edema. Chest CTA revealed the following, No evidence of pulmonary embolus.   Large right pleural effusion and small left pleural effusion. Airspace disease in the right lower lobe and right middle lobe could reflect atelectasis or pneumonia. Patchy opacities in the left lower lobe concerning for early pneumonia.   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 Recommend semi-annual imaging followup by CTA or MRA and referral to cardiothoracic surgery if not already obtained  Aortic atherosclerosis and emphysema.  The patient was given 1 g of p.o. Tylenol, IV cefepime and vancomycin and Flagyl, 2 L bolus of IV lactated Ringer followed 150 mill per hour.  He will be admitted to a medical monitored bed for further evaluation and management.   Hospital course from Dr. Jimmye Norman 6/29-03/05/21: Pt presented w/ sepsis likely secondary to multifocal pneumonia w/ recurrent right pleural effusion. Pt completed the abx course while inpatient. Pt is s/p right thoracentesis w/ 1.9L removed. Pleural fluid showed NGTD and cytology was pending at the time of d/c. Of note, pt had episodes of intermittent bradycardia w/ intermittent pauses (which were asymptomatic), so pt's home dose of metoprolol 159m BID was reduced to 232mBID instead. The intermittent pauses did not reoccur. Pt will f/u outpatient w/ his cardiologist, Dr. PaSaralyn Pilarn 1-2 weeks. Pt verbalized his understanding. PT/OT evaluated the pt and recommended home health. Home health was set up by CM prior to d/c.   Discharge Diagnoses:  Active Problems:   Multifocal pneumonia  Sepsis: met criteria w/ tachycardia,  tachypnea, leukocytosis and pneumonia. Continue on IV rocephin, azithromycin. Blood cxs NGTD. Urine cx shows containment. Resolved   Multifocal pneumonia: w/ assoc. recurrent pleural effusion & small left pleural effusion. Completed abx course. Continue on  bronchodilators. S/p right thoracentesis w/ 1.9L removed.    Acute on chronic hypoxic respiratory failure: secondary to pneumonia & pleural effusion. Completed abx course. Continue on bronchodilators. Monitor I/Os. S/p right thoracentesis w/ 1.9 L removed. Pleural fluid cx NGTD and cytology is pending still. 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  Continue on digoxin, warfain and reduced dose of metoprolol secondary to cardiac pauses which were asymptomatic   Bradycardia: intermittent, asymptomatic & w/ intermittent pauses. Possibly secondary to high dose of metoprolol. Continue on reduced dose of  metoprolol to 25 mg BID. 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 Cr is labile     Macrocytic anemia: s/p 1 unit of pRBCs transfused. H&H are labile. No indication for another transfusion currently    Hx of ascending thoracic aortic aneurysm: will need semi-annual imaging w/ f/u in CTA or MRA as an outpatient   HTN: continue on a reduced dose of metoprolol secondary to cardiac pauses that were asymptomatic. Continue to hold lasix   Anxiety: severity unknown. Continue on home dose of xanax   Chronic thrombocytopenia: WNL still. Continue on home dose of promacta   Discharge Instructions  Discharge Instructions     Diet - low sodium heart healthy   Complete by: As directed    Discharge instructions   Complete by: As directed    F/u w/ cardio, Dr. Saralyn Pilar, in 1-2 weeks. F/u w/ PCP in 1-2 weeks   Increase activity slowly   Complete by: As directed       Allergies as of 03/05/2021       Reactions   Bupropion Swelling   Amiodarone Rash   Broke out in dark blue blotches   Diltiazem Rash        Medication List     TAKE these medications    ALPRAZolam 0.5 MG tablet Commonly known as: XANAX Take 0.5 mg by mouth 2 (two) times daily as needed for anxiety.   digoxin 0.125  MG tablet Commonly known as: LANOXIN Take 0.125 mg by mouth daily.   eltrombopag 25 MG tablet Commonly known as: PROMACTA Take 1 tablet (25 mg total) by mouth daily. Take on an empty stomach 1 hour before a meal or 2 hours after   furosemide 20 MG tablet Commonly known as: LASIX Take 20 mg by mouth daily.   metoprolol tartrate 25 MG tablet Commonly known as: LOPRESSOR Take 1 tablet (25 mg total) by mouth 2 (two) times daily. What changed:  medication strength how much to take   warfarin 2 MG tablet Commonly known as: COUMADIN Take 2 mg by mouth daily.   warfarin 2.5 MG tablet Commonly known as: COUMADIN Take 2.5 mg by mouth every other day.               Durable Medical Equipment  (From admission, onward)           Start     Ordered   03/02/21 1548  For home use only DME oxygen  Once       Question Answer Comment  Length of Need Lifetime   Mode or (Route) Nasal cannula   Liters per  Minute 4   Frequency Continuous (stationary and portable oxygen unit needed)   Oxygen conserving device Yes   Oxygen delivery system Gas      03/02/21 1549            Follow-up Information     Paraschos, Alexander, MD Follow up in 1 week(s).   Specialty: Cardiology Contact information: [facility] [place] [address] [phone]                Allergies  Allergen Reactions   Bupropion Swelling   Amiodarone Rash    Broke out in dark blue blotches   Diltiazem Rash    Consultations:    Procedures/Studies: CT Angio Chest PE W and/or Wo Contrast  Result Date: 02/28/2021 CLINICAL DATA:  Shortness of breath EXAM: CT ANGIOGRAPHY CHEST WITH CONTRAST TECHNIQUE: Multidetector CT imaging of the chest was performed using the standard protocol during bolus administration of intravenous contrast. Multiplanar CT image reconstructions and MIPs were obtained to evaluate the vascular anatomy. CONTRAST:  75m OMNIPAQUE  IOHEXOL 350 MG/ML SOLN COMPARISON:  11/07/2020 FINDINGS: Cardiovascular: No filling defects in the pulmonary arteries to suggest pulmonary emboli. Cardiomegaly. Aneurysmal dilatation of the ascending thoracic aorta measures 4.7 cm. Mildly dilated main pulmonary artery measures 4.1 cm. Coronary artery and aortic calcifications. Mediastinum/Nodes: No mediastinal, hilar, or axillary adenopathy. Trachea and esophagus are unremarkable. Thyroid unremarkable. Lungs/Pleura: Mild emphysema. Large right pleural effusion and small left pleural effusion. Airspace disease in the right lower lobe and right middle lobe could reflect atelectasis or pneumonia. Patchy ground-glass peripheral opacities in the left lower lobe. Upper Abdomen: Imaging into the upper abdomen demonstrates no acute findings. Musculoskeletal: Chest wall soft tissues are unremarkable. No acute bony abnormality. Review of the MIP images confirms the above findings. IMPRESSION: No evidence of pulmonary embolus. Large right pleural effusion and small left pleural effusion. Airspace disease in the right lower lobe and right middle lobe could reflect atelectasis or pneumonia. Patchy opacities in the left lower lobe concerning for early pneumonia. 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 Recommend semi-annual imaging followup by CTA or MRA and referral to cardiothoracic surgery if not already obtained. This recommendation follows 2010 ACCF/AHA/AATS/ACR/ASA/SCA/SCAI/SIR/STS/SVM Guidelines for the Diagnosis and Management of Patients With Thoracic Aortic Disease. Circulation. 2010; 121:: J194-R740 Aortic aneurysm NOS (ICD10-I71.9) Aortic Atherosclerosis (ICD10-I70.0) and Emphysema (ICD10-J43.9). Electronically Signed   By: KRolm BaptiseM.D.   On: 02/28/2021 02:52   DG Chest Port 1 View  Result Date: 03/01/2021 CLINICAL DATA:  Status post right thoracentesis. EXAM: PORTABLE CHEST 1 VIEW  COMPARISON:  02/28/2021 FINDINGS: Stable cardiac enlargement. Significant diminishment in right pleural fluid volume after thoracentesis. There may be a tiny amount of pleural air at the lateral right lung base. Improved aeration of the right lower lung. Slight decrease in pulmonary edema. IMPRESSION: Decrease in right pleural fluid volume after thoracentesis with improved aeration of the right lung. Tiny amount of pleural air present adjacent to the lung at the right lung base. Slight decrease in pulmonary edema. Electronically Signed   By: GAletta EdouardM.D.   On: 03/01/2021 17:14   DG Chest Portable 1 View  Result Date: 02/28/2021 CLINICAL DATA:  Shortness of breath EXAM: PORTABLE CHEST 1 VIEW COMPARISON:  10/27/2020 FINDINGS: Small right pleural effusion with basilar atelectasis. Mild cardiomegaly. Right-greater-than-left mild interstitial opacity. IMPRESSION: Cardiomegaly, small right pleural effusion and mild pulmonary edema. Electronically Signed   By: KUlyses Jarred  M.D.   On: 02/28/2021 01:20   US THORACENTESIS ASP PLEURAL SPACE W/IMG GUIDE  Result Date: 03/01/2021 CLINICAL DATA:  Shortness of breath and moderate to large right pleural effusion. EXAM: ULTRASOUND GUIDED RIGHT THORACENTESIS COMPARISON:  None. PROCEDURE: An ultrasound guided thoracentesis was thoroughly discussed with the patient and questions answered. The benefits, risks, alternatives and complications were also discussed. The patient understands and wishes to proceed with the procedure. Written consent was obtained. Ultrasound was performed to localize and mark an adequate pocket of fluid in the right chest. The area was then prepped and draped in the normal sterile fashion. 1% Lidocaine was used for local anesthesia. Under ultrasound guidance a 6 French Safe-T-Centesis catheter was introduced. Thoracentesis was performed. The catheter was removed and a dressing applied. COMPLICATIONS: None FINDINGS: A total of approximately 1.9 L  of amber fluid was removed. A fluid sample was sent for laboratory analysis. IMPRESSION: Successful ultrasound guided right thoracentesis yielding 1.9 L of pleural fluid. Electronically Signed   By: Aletta Edouard M.D.   On: 03/01/2021 17:12   (Echo, Carotid, EGD, Colonoscopy, ERCP)    Subjective:   Discharge Exam: Vitals:   03/05/21 0527 03/05/21 0900  BP: (!) 115/56 (!) 116/58  Pulse: 96 92  Resp: 16 18  Temp: 98.4 F (36.9 C) 98.6 F (37 C)  SpO2: 97% 98%   Vitals:   03/04/21 2340 03/05/21 0500 03/05/21 0527 03/05/21 0900  BP: (!) 129/58  (!) 115/56 (!) 116/58  Pulse: 91  96 92  Resp: '16  16 18  ' Temp: 98.1 F (36.7 C)  98.4 F (36.9 C) 98.6 F (37 C)  TempSrc:    Oral  SpO2: 95%  97% 98%  Weight:  65 kg    Height:        General: Pt is alert, awake, not in acute distress Cardiovascular: irregularly irregular, no rubs, no gallops Respiratory: diminished breath sounds b/l otherwise clear Abdominal: Soft, NT, ND, bowel sounds + Extremities: no edema, no cyanosis    The results of significant diagnostics from this hospitalization (including imaging, microbiology, ancillary and laboratory) are listed below for reference.     Microbiology: Recent Results (from the past 240 hour(s))  Resp Panel by RT-PCR (Flu A&B, Covid) Nasopharyngeal Swab     Status: None   Collection Time: 02/28/21  1:01 AM   Specimen: Nasopharyngeal Swab; Nasopharyngeal(NP) swabs in vial transport medium  Result Value Ref Range Status   SARS Coronavirus 2 by RT PCR NEGATIVE NEGATIVE Final    Comment: (NOTE) SARS-CoV-2 target nucleic acids are NOT DETECTED.  The SARS-CoV-2 RNA is generally detectable in upper respiratory specimens during the acute phase of infection. The lowest concentration of SARS-CoV-2 viral copies this assay can detect is 138 copies/mL. A negative result does not preclude SARS-Cov-2 infection and should not be used as the sole basis for treatment or other patient  management decisions. A negative result may occur with  improper specimen collection/handling, submission of specimen other than nasopharyngeal swab, presence of viral mutation(s) within the areas targeted by this assay, and inadequate number of viral copies(<138 copies/mL). A negative result must be combined with clinical observations, patient history, and epidemiological information. The expected result is Negative.  Fact Sheet for Patients:  EntrepreneurPulse.com.au  Fact Sheet for Healthcare Providers:  IncredibleEmployment.be  This test is no t yet approved or cleared by the [place] FDA and  has been authorized for detection and/or diagnosis of SARS-CoV-2 by FDA under an Emergency Use Authorization (  EUA). This EUA will remain  in effect (meaning this test can be used) for the duration of the COVID-19 declaration under Section 564(b)(1) of the Act, 21 U.S.C.section 360bbb-3(b)(1), unless the authorization is terminated  or revoked sooner.       Influenza A by PCR NEGATIVE NEGATIVE Final   Influenza B by PCR NEGATIVE NEGATIVE Final    Comment: (NOTE) The Xpert Xpress SARS-CoV-2/FLU/RSV plus assay is intended as an aid in the diagnosis of influenza from Nasopharyngeal swab specimens and should not be used as a sole basis for treatment. Nasal washings and aspirates are unacceptable for Xpert Xpress SARS-CoV-2/FLU/RSV testing.  Fact Sheet for Patients: EntrepreneurPulse.com.au  Fact Sheet for Healthcare Providers: IncredibleEmployment.be  This test is not yet approved or cleared by the [place] FDA and has been authorized for detection and/or diagnosis of SARS-CoV-2 by FDA under an Emergency Use Authorization (EUA). This EUA will remain in effect (meaning this test can be used) for the duration of the COVID-19 declaration under Section 564(b)(1) of the Act, 21 U.S.C. section 360bbb-3(b)(1),  unless the authorization is terminated or revoked.  Performed at [facility], [address] [place], [place] [address]   Blood Culture (routine x 2)     Status: None   Collection Time: 02/28/21  1:01 AM   Specimen: BLOOD  Result Value Ref Range Status   Specimen Description BLOOD LEFT WRIST  Final   Special Requests   Final    BOTTLES DRAWN AEROBIC AND ANAEROBIC Blood Culture adequate volume   Culture   Final    NO GROWTH 5 DAYS Performed at [facility], [address], [place], [place] [address]    Report Status 03/05/2021 FINAL  Final  Blood Culture (routine x 2)     Status: None   Collection Time: 02/28/21  1:33 AM   Specimen: BLOOD  Result Value Ref Range Status   Specimen Description BLOOD RIGHT ASSIST CONTROL  Final   Special Requests   Final    BOTTLES DRAWN AEROBIC AND ANAEROBIC Blood Culture adequate volume   Culture   Final    NO GROWTH 5 DAYS Performed at [facility], [address], [place], [place] [address]    Report Status 03/05/2021 FINAL  Final  Urine culture     Status: Abnormal   Collection Time: 02/28/21  6:56 AM   Specimen: In/Out Cath Urine  Result Value Ref Range Status   Specimen Description   Final    IN/OUT CATH URINE Performed at [facility], [address], [place], [place] [address]    Special Requests   Final    NONE Performed at [facility], [address] [place], [place] [address]    Culture MULTIPLE SPECIES PRESENT, SUGGEST RECOLLECTION (A)  Final   Report Status 03/01/2021 FINAL  Final  Acid Fast Smear (AFB)     Status: None   Collection Time: 03/01/21  4:45 PM   Specimen: PATH Cytology Pleural fluid  Result Value Ref Range Status   AFB Specimen Processing Concentration  Final   Acid Fast Smear Negative  Final    Comment: (NOTE) Performed At: [facility] [place], [place] [id number] Rush Farmer MD QQ:[id number]    Source (AFB) PLEURAL  Final     Comment: Performed at [facility], [address] [place], [place] [address]  Body fluid culture w Gram Stain     Status: None   Collection Time: 03/01/21  4:45  PM   Specimen: PATH Cytology Pleural fluid  Result Value Ref Range Status   Specimen Description   Final    PLEURAL Performed at [facility], [address] [place], [place] [address]    Special Requests   Final    NONE Performed at [facility], [address] [place], [place] [address]    Gram Stain   Final    FEW WBC PRESENT, PREDOMINANTLY MONONUCLEAR NO ORGANISMS SEEN    Culture   Final    NO GROWTH 3 DAYS Performed at [facility], [place] [address], [place], [place] [address]    Report Status 03/05/2021 FINAL  Final     Labs: BNP (last 3 results) Recent Labs    02/28/21 0101  BNP 9,675.9*   Basic Metabolic Panel: Recent Labs  Lab 03/01/21 0608 03/02/21 0525 03/03/21 0550 03/04/21 0436 03/05/21 0615  NA 137 136 137 134* 134*  K 4.3 3.7 4.2 4.5 4.5  CL 101 101 101 100 99  CO2 '29 27 29 26 24  ' GLUCOSE 98 105* 94 105* 94  BUN 34* 35* 38* 42* 40*  CREATININE 1.55* 1.58* 1.64* 1.53* 1.44*  CALCIUM 8.7* 8.8* 8.7* 8.4* 8.9  MG  --  1.7  --   --   --    Liver Function Tests: Recent Labs  Lab 02/28/21 0101  AST 33  ALT 17  ALKPHOS 95  BILITOT 1.1  PROT 8.5*  ALBUMIN 3.8   No results for input(s): LIPASE, AMYLASE in the last 168 hours. No results for input(s): AMMONIA in the last 168 hours. CBC: Recent Labs  Lab 02/28/21 0101 03/01/21 0608 03/02/21 0525 03/03/21 0550 03/03/21 1845 03/04/21 0436 03/05/21 0615  WBC 14.4*  14.3* 12.1* 11.3* 11.3*  --  12.0* 10.0  NEUTROABS 9.6*  --   --   --   --   --   --   HGB 8.7*  8.8* 7.3* 7.5* 7.0* 8.8* 8.2* 8.5*  HCT 27.0*  26.9* 21.4* 22.2* 20.5* 26.4* 24.2* 24.8*  MCV 101.1*  101.5* 97.7 100.0 96.7  --  93.8 95.4  PLT 340  332 267 275 282  --  283 289   Cardiac Enzymes: No results for input(s): CKTOTAL,  CKMB, CKMBINDEX, TROPONINI in the last 168 hours. BNP: Invalid input(s): POCBNP CBG: Recent Labs  Lab 03/02/21 1246  GLUCAP 175*   D-Dimer No results for input(s): DDIMER in the last 72 hours. Hgb A1c No results for input(s): HGBA1C in the last 72 hours. Lipid Profile No results for input(s): CHOL, HDL, LDLCALC, TRIG, CHOLHDL, LDLDIRECT in the last 72 hours. Thyroid function studies No results for input(s): TSH, T4TOTAL, T3FREE, THYROIDAB in the last 72 hours.  Invalid input(s): FREET3 Anemia work up No results for input(s): VITAMINB12, FOLATE, FERRITIN, TIBC, IRON, RETICCTPCT in the last 72 hours. Urinalysis    Component Value Date/Time   COLORURINE YELLOW (A) 02/28/2021 0656   APPEARANCEUR HAZY (A) 02/28/2021 0656   LABSPEC 1.028 02/28/2021 0656   PHURINE 5.0 02/28/2021 0656   GLUCOSEU NEGATIVE 02/28/2021 0656   HGBUR NEGATIVE 02/28/2021 0656   BILIRUBINUR NEGATIVE 02/28/2021 0656   KETONESUR NEGATIVE 02/28/2021 0656   PROTEINUR NEGATIVE 02/28/2021 0656   NITRITE POSITIVE (A) 02/28/2021 0656   LEUKOCYTESUR MODERATE (A) 02/28/2021 0656   Sepsis Labs Invalid input(s): PROCALCITONIN,  WBC,  LACTICIDVEN Microbiology Recent Results (from the past 240 hour(s))  Resp Panel by RT-PCR (Flu A&B, Covid) Nasopharyngeal Swab  Status: None   Collection Time: 02/28/21  1:01 AM   Specimen: Nasopharyngeal Swab; Nasopharyngeal(NP) swabs in vial transport medium  Result Value Ref Range Status   SARS Coronavirus 2 by RT PCR NEGATIVE NEGATIVE Final    Comment: (NOTE) SARS-CoV-2 target nucleic acids are NOT DETECTED.  The SARS-CoV-2 RNA is generally detectable in upper respiratory specimens during the acute phase of infection. The lowest concentration of SARS-CoV-2 viral copies this assay can detect is 138 copies/mL. A negative result does not preclude SARS-Cov-2 infection and should not be used as the sole basis for treatment or other patient management decisions. A negative  result may occur with  improper specimen collection/handling, submission of specimen other than nasopharyngeal swab, presence of viral mutation(s) within the areas targeted by this assay, and inadequate number of viral copies(<138 copies/mL). A negative result must be combined with clinical observations, patient history, and epidemiological information. The expected result is Negative.  Fact Sheet for Patients:  EntrepreneurPulse.com.au  Fact Sheet for Healthcare Providers:  IncredibleEmployment.be  This test is no t yet approved or cleared by the [place] FDA and  has been authorized for detection and/or diagnosis of SARS-CoV-2 by FDA under an Emergency Use Authorization (EUA). This EUA will remain  in effect (meaning this test can be used) for the duration of the COVID-19 declaration under Section 564(b)(1) of the Act, 21 U.S.C.section 360bbb-3(b)(1), unless the authorization is terminated  or revoked sooner.       Influenza A by PCR NEGATIVE NEGATIVE Final   Influenza B by PCR NEGATIVE NEGATIVE Final    Comment: (NOTE) The Xpert Xpress SARS-CoV-2/FLU/RSV plus assay is intended as an aid in the diagnosis of influenza from Nasopharyngeal swab specimens and should not be used as a sole basis for treatment. Nasal washings and aspirates are unacceptable for Xpert Xpress SARS-CoV-2/FLU/RSV testing.  Fact Sheet for Patients: EntrepreneurPulse.com.au  Fact Sheet for Healthcare Providers: IncredibleEmployment.be  This test is not yet approved or cleared by the [place] FDA and has been authorized for detection and/or diagnosis of SARS-CoV-2 by FDA under an Emergency Use Authorization (EUA). This EUA will remain in effect (meaning this test can be used) for the duration of the COVID-19 declaration under Section 564(b)(1) of the Act, 21 U.S.C. section 360bbb-3(b)(1), unless the authorization is  terminated or revoked.  Performed at [facility], [address] [place], [place] [address]   Blood Culture (routine x 2)     Status: None   Collection Time: 02/28/21  1:01 AM   Specimen: BLOOD  Result Value Ref Range Status   Specimen Description BLOOD LEFT WRIST  Final   Special Requests   Final    BOTTLES DRAWN AEROBIC AND ANAEROBIC Blood Culture adequate volume   Culture   Final    NO GROWTH 5 DAYS Performed at [facility], [address], [place], [place] [address]    Report Status 03/05/2021 FINAL  Final  Blood Culture (routine x 2)     Status: None   Collection Time: 02/28/21  1:33 AM   Specimen: BLOOD  Result Value Ref Range Status   Specimen Description BLOOD RIGHT ASSIST CONTROL  Final   Special Requests   Final    BOTTLES DRAWN AEROBIC AND ANAEROBIC Blood Culture adequate volume   Culture   Final    NO GROWTH 5 DAYS Performed at [facility], [address] [place], [place] [address]    Report Status 03/05/2021 FINAL  Final  Urine culture  Status: Abnormal   Collection Time: 02/28/21  6:56 AM   Specimen: In/Out Cath Urine  Result Value Ref Range Status   Specimen Description   Final    IN/OUT CATH URINE Performed at [facility], [address] [place], [place] [address]    Special Requests   Final    NONE Performed at [facility], [address] [place], [place] [address]    Culture MULTIPLE SPECIES PRESENT, SUGGEST RECOLLECTION (A)  Final   Report Status 03/01/2021 FINAL  Final  Acid Fast Smear (AFB)     Status: None   Collection Time: 03/01/21  4:45 PM   Specimen: PATH Cytology Pleural fluid  Result Value Ref Range Status   AFB Specimen Processing Concentration  Final   Acid Fast Smear Negative  Final    Comment: (NOTE) Performed At: [facility] [place], [place] [id number] Rush Farmer MD [id number]    Source (AFB) PLEURAL  Final    Comment: Performed at  [facility], [address] [place], [place] [address]  Body fluid culture w Gram Stain     Status: None   Collection Time: 03/01/21  4:45 PM   Specimen: PATH Cytology Pleural fluid  Result Value Ref Range Status   Specimen Description   Final    PLEURAL Performed at [facility], [address] [place], [place] [address]    Special Requests   Final    NONE Performed at [facility], [address] [place], [place] [address]    Gram Stain   Final    FEW WBC PRESENT, PREDOMINANTLY MONONUCLEAR NO ORGANISMS SEEN    Culture   Final    NO GROWTH 3 DAYS Performed at [facility], [place] [address], [place], [place] [address]    Report Status 03/05/2021 FINAL  Final     Time coordinating discharge: Over 30 minutes  SIGNED:   Wyvonnia Dusky, MD  [facility] 03/05/2021, 10:54 AM Pager   If 7PM-7AM, please contact night-coverage [email or web address]

## 2021-03-05 NOTE — Care Management Important Message (Signed)
Important Message  Patient Details  Name: Samuel Mcknight MRN: [id number] Date of Birth: 1941-02-09   Medicare Important Message Given:  Yes     Dannette Barbara 03/05/2021, 10:58 AM

## 2021-03-05 NOTE — Progress Notes (Signed)
ANTICOAGULATION CONSULT NOTE  Pharmacy Consult for warfarin Indication: atrial fibrillation  Patient Measurements: Height: 5\' 7"  (170.2 cm) Weight: 65 kg (143 lb 4.8 oz) IBW/kg (Calculated) : 66.1  Labs: Recent Labs    03/03/21 0550 03/03/21 1845 03/04/21 0436 03/05/21 0615  HGB 7.0* 8.8* 8.2* 8.5*  HCT 20.5* 26.4* 24.2* 24.8*  PLT 282  --  283 289  LABPROT 25.5*  --  28.1* 27.1*  INR 2.3*  --  2.6* 2.5*  CREATININE 1.64*  --  1.53* 1.44*     Estimated Creatinine Clearance: 37.6 mL/min (A) (by C-G formula based on SCr of 1.44 mg/dL (H)).   Medical History: Past Medical History:  Diagnosis Date   Atrial fibrillation (HCC)    GAD (generalized anxiety disorder)    Hypertension    Prediabetes     Medications:  Scheduled:   digoxin  0.125 mg Oral Daily   eltrombopag  25 mg Oral Daily   feeding supplement  237 mL Oral BID BM   metoprolol tartrate  25 mg Oral BID   midodrine  10 mg Oral TID WC   Warfarin - Pharmacist Dosing Inpatient   Does not apply q1600    Assessment: 80 yo male on Warfarin PTA for Afib admitted with PNA, pleural effusion. Home warfarin regimen (see 01/16/21 office note also): warfarin 2 mg DAILY with 2.5 mg Every other day  DDIs: azithromycin & ceftriaxone (completed 7/3)  Date   INR   warfarin dose 6/29    2.0      2 mg 6/30    2.2   Held for thoracentesis 7/01   2.3   3 mg 7/02   2.3   2.5 mg 7/03   2.6   1 mg 7/04   2.5   2 mg   Goal of Therapy:  INR 2-3 Monitor platelets by anticoagulation protocol: Yes   Plan:  Warfarin 2 mg tonight. Patient has completed antibiotics f/u INR daily and CBC per protocol  Benita Gutter 03/05/2021 7:30 AM

## 2021-03-05 NOTE — Progress Notes (Signed)
Patient discharged home in stable condition with wife.

## 2021-03-05 NOTE — Progress Notes (Signed)
Patient's home medication Promacta (7 pills) received from pharmacy, verified by Lurline Hare and returned to patient. Discharge planned for today.

## 2021-03-05 NOTE — TOC Transition Note (Signed)
Transition of Care [facility]) - CM/SW Discharge Note   Patient Details  Name: Samuel Mcknight MRN: [id number] Date of Birth: 08-Sep-1940  Transition of Care [facility]) CM/SW Contact:  Shelbie Hutching, RN Phone Number: 03/05/2021, 11:09 AM   Clinical Narrative:    Patient medically cleared for discharge home with home health services through [facility].  [name] with [facility] aware of discharge home today.  Patient's wife will be picking him up around 1200 today.    Final next level of care: [place] Barriers to Discharge: Barriers Resolved   Patient Goals and CMS Choice Patient states their goals for this hospitalization and ongoing recovery are:: wants to be ambulatory and to breath well CMS Medicare.gov Compare Post Acute Care list provided to:: Patient Choice offered to / list presented to : Patient  Discharge Placement                       Discharge Plan and Services   Discharge Planning Services: CM Consult Post Acute Care Choice: Home Health          DME Arranged: N/A DME Agency: NA       HH Arranged: RN, PT, OT [place] Agency: [place] ([place]) Date HH Agency Contacted: 03/05/21 Time [place]: 1109 Representative spoke with at [place]: [place] ([place]) Interventions     Readmission Risk Interventions Readmission Risk Prevention Plan 03/02/2021  Transportation Screening Complete  PCP or Specialist Appt within 5-7 Days Complete  Home Care Screening Complete  Medication Review (RN CM) Complete  Some recent data might be hidden

## 2021-03-05 NOTE — Progress Notes (Signed)
Patient verbalized understanding of all discharge instructions including follow up appointments and medication changes.

## 2021-03-06 ENCOUNTER — Emergency Department: Payer: Medicare Other

## 2021-03-06 ENCOUNTER — Inpatient Hospital Stay: Payer: Medicare Other

## 2021-03-06 ENCOUNTER — Inpatient Hospital Stay
Admission: EM | Admit: 2021-03-06 | Discharge: 2021-03-08 | DRG: 291 | Disposition: A | Discharge status: Home / Self Care | Payer: Medicare Other | Attending: Hospitalist | Admitting: Hospitalist

## 2021-03-06 ENCOUNTER — Inpatient Hospital Stay: Payer: Medicare Other | Admitting: Oncology

## 2021-03-06 ENCOUNTER — Telehealth: Payer: Self-pay | Admitting: Internal Medicine

## 2021-03-06 ENCOUNTER — Other Ambulatory Visit: Payer: Self-pay

## 2021-03-06 DIAGNOSIS — R0602 Shortness of breath: Secondary | ICD-10-CM | POA: Diagnosis not present

## 2021-03-06 DIAGNOSIS — N1831 Chronic kidney disease, stage 3a: Secondary | ICD-10-CM | POA: Diagnosis present

## 2021-03-06 DIAGNOSIS — Z87891 Personal history of nicotine dependence: Secondary | ICD-10-CM

## 2021-03-06 DIAGNOSIS — Z801 Family history of malignant neoplasm of trachea, bronchus and lung: Secondary | ICD-10-CM

## 2021-03-06 DIAGNOSIS — I10 Essential (primary) hypertension: Secondary | ICD-10-CM

## 2021-03-06 DIAGNOSIS — J9621 Acute and chronic respiratory failure with hypoxia: Secondary | ICD-10-CM | POA: Diagnosis not present

## 2021-03-06 DIAGNOSIS — Z79899 Other long term (current) drug therapy: Secondary | ICD-10-CM

## 2021-03-06 DIAGNOSIS — J9610 Chronic respiratory failure, unspecified whether with hypoxia or hypercapnia: Secondary | ICD-10-CM | POA: Diagnosis present

## 2021-03-06 DIAGNOSIS — Z7901 Long term (current) use of anticoagulants: Secondary | ICD-10-CM

## 2021-03-06 DIAGNOSIS — N183 Chronic kidney disease, stage 3 unspecified: Secondary | ICD-10-CM | POA: Diagnosis present

## 2021-03-06 DIAGNOSIS — F419 Anxiety disorder, unspecified: Secondary | ICD-10-CM | POA: Diagnosis present

## 2021-03-06 DIAGNOSIS — I5033 Acute on chronic diastolic (congestive) heart failure: Secondary | ICD-10-CM | POA: Diagnosis present

## 2021-03-06 DIAGNOSIS — D649 Anemia, unspecified: Secondary | ICD-10-CM | POA: Diagnosis present

## 2021-03-06 DIAGNOSIS — I130 Hypertensive heart and chronic kidney disease with heart failure and stage 1 through stage 4 chronic kidney disease, or unspecified chronic kidney disease: Secondary | ICD-10-CM | POA: Diagnosis not present

## 2021-03-06 DIAGNOSIS — Z20822 Contact with and (suspected) exposure to covid-19: Secondary | ICD-10-CM | POA: Diagnosis present

## 2021-03-06 DIAGNOSIS — R0902 Hypoxemia: Secondary | ICD-10-CM | POA: Diagnosis present

## 2021-03-06 DIAGNOSIS — I350 Nonrheumatic aortic (valve) stenosis: Secondary | ICD-10-CM | POA: Diagnosis present

## 2021-03-06 DIAGNOSIS — I482 Chronic atrial fibrillation, unspecified: Secondary | ICD-10-CM | POA: Diagnosis present

## 2021-03-06 DIAGNOSIS — D631 Anemia in chronic kidney disease: Secondary | ICD-10-CM | POA: Diagnosis present

## 2021-03-06 DIAGNOSIS — G4733 Obstructive sleep apnea (adult) (pediatric): Secondary | ICD-10-CM | POA: Diagnosis present

## 2021-03-06 DIAGNOSIS — D472 Monoclonal gammopathy: Secondary | ICD-10-CM

## 2021-03-06 LAB — BRAIN NATRIURETIC PEPTIDE: B Natriuretic Peptide: 1093.7 pg/mL — ABNORMAL HIGH (ref 0.0–100.0)

## 2021-03-06 LAB — URINALYSIS, COMPLETE (UACMP) WITH MICROSCOPIC
Bacteria, UA: NONE SEEN
Bilirubin Urine: NEGATIVE
Glucose, UA: NEGATIVE mg/dL
Hgb urine dipstick: NEGATIVE
Ketones, ur: NEGATIVE mg/dL
Leukocytes,Ua: NEGATIVE
Nitrite: NEGATIVE
Protein, ur: 30 mg/dL — AB
Specific Gravity, Urine: 1.012 (ref 1.005–1.030)
pH: 5 (ref 5.0–8.0)

## 2021-03-06 LAB — CBC
HCT: 25.6 % — ABNORMAL LOW (ref 39.0–52.0)
Hemoglobin: 8.5 g/dL — ABNORMAL LOW (ref 13.0–17.0)
MCH: 32.8 pg (ref 26.0–34.0)
MCHC: 33.2 g/dL (ref 30.0–36.0)
MCV: 98.8 fL (ref 80.0–100.0)
Platelets: 272 10*3/uL (ref 150–400)
RBC: 2.59 MIL/uL — ABNORMAL LOW (ref 4.22–5.81)
RDW: 18 % — ABNORMAL HIGH (ref 11.5–15.5)
WBC: 11.4 10*3/uL — ABNORMAL HIGH (ref 4.0–10.5)
nRBC: 0 % (ref 0.0–0.2)

## 2021-03-06 LAB — BASIC METABOLIC PANEL
Anion gap: 6 (ref 5–15)
BUN: 33 mg/dL — ABNORMAL HIGH (ref 8–23)
CO2: 26 mmol/L (ref 22–32)
Calcium: 9 mg/dL (ref 8.9–10.3)
Chloride: 102 mmol/L (ref 98–111)
Creatinine, Ser: 1.41 mg/dL — ABNORMAL HIGH (ref 0.61–1.24)
GFR, Estimated: 50 mL/min — ABNORMAL LOW (ref >60)
Glucose, Bld: 146 mg/dL — ABNORMAL HIGH (ref 70–99)
Potassium: 4.7 mmol/L (ref 3.5–5.1)
Sodium: 134 mmol/L — ABNORMAL LOW (ref 135–145)

## 2021-03-06 LAB — PROTIME-INR
INR: 2.7 — ABNORMAL HIGH (ref 0.8–1.2)
Prothrombin Time: 28.7 seconds — ABNORMAL HIGH (ref 11.4–15.2)

## 2021-03-06 LAB — PROCALCITONIN: Procalcitonin: 0.6 ng/mL

## 2021-03-06 LAB — TROPONIN I (HIGH SENSITIVITY)
Troponin I (High Sensitivity): 48 ng/L — ABNORMAL HIGH (ref <18)
Troponin I (High Sensitivity): 42 ng/L — ABNORMAL HIGH (ref <18)

## 2021-03-06 LAB — CYTOLOGY - NON PAP

## 2021-03-06 LAB — RESP PANEL BY RT-PCR (FLU A&B, COVID) ARPGX2
Influenza A by PCR: NEGATIVE
Influenza B by PCR: NEGATIVE
SARS Coronavirus 2 by RT PCR: NEGATIVE

## 2021-03-06 MED ORDER — ONDANSETRON HCL 4 MG/2ML IJ SOLN: 4.0000 mg | Freq: Four times a day (QID) | INTRAMUSCULAR | Status: DC | PRN

## 2021-03-06 MED ORDER — WARFARIN SODIUM 2 MG PO TABS
2.0000 mg | ORAL_TABLET | Freq: Every day | ORAL | Status: DC
  Administered 2021-03-07 (×2): 2 mg via ORAL
  Filled 2021-03-06 (×2): qty 1

## 2021-03-06 MED ORDER — ELTROMBOPAG OLAMINE 25 MG PO TABS: 25.0000 mg | ORAL_TABLET | Freq: Every day | ORAL | Status: DC

## 2021-03-06 MED ORDER — FUROSEMIDE 10 MG/ML IJ SOLN
80.0000 mg | Freq: Once | INTRAMUSCULAR | Status: AC
  Administered 2021-03-06: 80 mg via INTRAVENOUS
  Filled 2021-03-06: qty 8

## 2021-03-06 MED ORDER — ACETAMINOPHEN 325 MG PO TABS: 650.0000 mg | ORAL_TABLET | Freq: Four times a day (QID) | ORAL | Status: DC | PRN

## 2021-03-06 MED ORDER — FUROSEMIDE 10 MG/ML IJ SOLN
60.0000 mg | Freq: Two times a day (BID) | INTRAMUSCULAR | Status: DC
  Administered 2021-03-07 (×2): 60 mg via INTRAVENOUS
  Filled 2021-03-06 (×2): qty 6

## 2021-03-06 MED ORDER — ONDANSETRON HCL 4 MG PO TABS: 4.0000 mg | ORAL_TABLET | Freq: Four times a day (QID) | ORAL | Status: DC | PRN

## 2021-03-06 MED ORDER — ALPRAZOLAM 0.5 MG PO TABS
0.5000 mg | ORAL_TABLET | Freq: Two times a day (BID) | ORAL | Status: DC | PRN
  Administered 2021-03-07 (×2): 0.5 mg via ORAL
  Filled 2021-03-06 (×2): qty 1

## 2021-03-06 MED ORDER — DIGOXIN 125 MCG PO TABS
0.1250 mg | ORAL_TABLET | Freq: Every day | ORAL | Status: DC
  Administered 2021-03-07: 0.125 mg via ORAL
  Filled 2021-03-06 (×2): qty 1

## 2021-03-06 MED ORDER — ACETAMINOPHEN 650 MG RE SUPP: 650.0000 mg | Freq: Four times a day (QID) | RECTAL | Status: DC | PRN

## 2021-03-06 MED ORDER — METOPROLOL TARTRATE 25 MG PO TABS
25.0000 mg | ORAL_TABLET | Freq: Two times a day (BID) | ORAL | Status: DC
  Administered 2021-03-06 – 2021-03-08 (×4): 25 mg via ORAL
  Filled 2021-03-06 (×4): qty 1

## 2021-03-06 MED ORDER — WARFARIN SODIUM 2.5 MG PO TABS
2.5000 mg | ORAL_TABLET | ORAL | Status: DC
  Administered 2021-03-07: 2.5 mg via ORAL
  Filled 2021-03-06: qty 1

## 2021-03-06 MED ORDER — WARFARIN - PHARMACIST DOSING INPATIENT
Freq: Every day | Status: DC
  Filled 2021-03-06: qty 1

## 2021-03-06 MED ORDER — WARFARIN SODIUM 2.5 MG PO TABS
2.5000 mg | ORAL_TABLET | ORAL | Status: DC
  Filled 2021-03-06: qty 1

## 2021-03-06 NOTE — H&P (Signed)
History and Physical   Samuel Mcknight WGY:[id number] DOB: 1941/05/24 DOA: 03/06/2021  PCP: Kirk Ruths, MD  Outpatient Specialists: Dr. Leanna Sato clinic cardiology Patient coming from: Home via EMS  I have personally briefly reviewed patient's old medical records in [place].  Chief Concern: Hypoxia  HPI: Samuel Mcknight is a 80 y.o. male with medical history significant for MGUS, atrial fibrillation, hypertension, CKD 3A, on digoxin, anxiety, bicuspid aortic valve, nonrheumatic aortic valve stenosis, severe aortic stenosis, shortness of breath, chronic respiratory failure requiring oxygen supplementation at baseline, exertional shortness of breath, presents emergency department for chief concerns of hypoxia.  Patient was discharged from the hospital on 03/05/2021.  He reports that he felt better when he left the hospital.  On evening of 03/05/2021, he did feel some shortness of breath and he took his alprazolam which did help him feel better.  He then went to sleep.  He reports that he was sleeping when his nurse tried to wake him up.  Per patient, his nursing aid took his vitals, and it was reported that the oxygen level was 74 on 4 L nasal cannula, while patient was sleeping.  They attempted to increase the oxygen level however was unable to.  He denies fever, nausea, vomiting, diarrhea. He denies dysuria.   Social history: Lives at home with his wife.  He is a former tobacco user and quit many years ago.  He denies EtOH and recreational drug use.  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  Vaccination history: He is vaccinated for COVID-19, 4 total doses  ROS: Constitutional: no weight change, no fever ENT/Mouth: no sore throat, no rhinorrhea Eyes: no eye pain, no vision changes Cardiovascular: no chest pain, + dyspnea,  no edema, no palpitations Respiratory: no cough, no sputum, no wheezing Gastrointestinal: no  nausea, no vomiting, no diarrhea, no constipation Genitourinary: no urinary incontinence, no dysuria, no hematuria Musculoskeletal: no arthralgias, no myalgias Skin: no skin lesions, no pruritus, Neuro: + weakness, no loss of consciousness, no syncope Psych: no anxiety, no depression, + decrease appetite Heme/Lymph: no bruising, no bleeding  ED Course: Discussed with emergency medicine provider, patient requiring hospitalization for hypoxia.  Vitals in the emergency department was remarkable for temperature of 98.2, respiration rate of 21, heart rate 93, blood pressure initially was 108/60 and improved to 116/57, SPO2 was 99% on 4 L nasal cannula.  Labs in the emergency department was remarkable for sodium of 134, potassium 4.7, chloride 102, BUN 33, serum creatinine of 1.41, nonfasting blood glucose of 146, EGFR 50, WBC 11.4, hemoglobin 8.5, platelets 212.  BNP was elevated at 1093.  Troponin was increased at 48 and decreased to 42.  EDP ordered Lasix 80 mg IV once.  Assessment/Plan  Principal Problem:   Acute on chronic respiratory failure with hypoxia (HCC) Active Problems:   Normocytic anemia   Anemia of chronic kidney failure, stage 3 (moderate)   Shortness of breath   MGUS (monoclonal gammopathy of unknown significance)   Atrial fibrillation, chronic (HCC)   Essential hypertension   # Acute on chronic respiratory failure with hypoxia # Meet sepsis criteria with elevated respiration rate, heart rate, WBC - Check procalcitonin, UA - UA on admission was negative for nitrates and leukocytes - Chest x-ray was read as small right pleural effusion - A.m. team to consider consultation to radiology for ultrasound-guided thoracentesis, patient has had a history of thoracentesis in the  past due to accumulation - His cardiology office is attempting to order him a CPAP with higher capacity and this is in process outpatient at this time - Would recommend patient get a ambulatory pulse ox  prior to discharge as patient may need increased oxygenation - CPAP nightly ordered  # Atrial fibrillation-warfarin per pharmacy, resumed metoprolol tartrate 25 mg twice daily  # Aortic valve stenosis-outpatient follow-up with cardiology - TAVR was discussed with patient on 01/16/2021 at his cardiology office with Dr. Saralyn Pilar, who was hesitant on TAVR -Digoxin 0.125 mg daily resumed for 03/07/2021  # Hypertension-resumed metoprolol tartrate 25 mg twice daily # CKD 3A-at baseline # Anxiety-alprazolam 0.5 mg twice daily as needed for anxiety resumed # COVID PCR/influenza A/influenza B were negative  # Patient has severe aortic valve stenosis and is hesitant about TAVR, patient is full code - Would recommend a.m. team to consult palliative care for further discussion regarding goals of care  Chart reviewed.   DVT prophylaxis: Warfarin per pharmacy Code Status: Full code Diet: Heart healthy Family Communication: No Disposition Plan: Pending clinical course, at risk for readmission Consults called: None at this time Admission status: Progressive cardiac, observation, telemetry ordered for 24 hours  Past Medical History:  Diagnosis Date   Atrial fibrillation ([place])    GAD (generalized anxiety disorder)    Hypertension    Prediabetes    Past Surgical History:  Procedure Laterality Date   CARDIOVERSION N/A 02/24/2020   Procedure: CARDIOVERSION;  Surgeon: Isaias Cowman, MD;  Location: [facility] ORS;  Service: Cardiovascular;  Laterality: N/A;   supraorbital artery repair  2005   TOOTH EXTRACTION     Social History:  reports that he has quit smoking. His smoking use included cigarettes. He quit smokeless tobacco use about 16 years ago. He reports previous alcohol use. He reports previous drug use.  Allergies  Allergen Reactions   Bupropion Swelling   Amiodarone Rash    Broke out in dark blue blotches   Diltiazem Rash   Family History  Problem Relation Age of Onset   Lung  cancer Father    Family history: Family history reviewed and not pertinent  Prior to Admission medications   Medication Sig Start Date End Date Taking? Authorizing Provider  ALPRAZolam Duanne Moron) 0.5 MG tablet Take 0.5 mg by mouth 2 (two) times daily as needed for anxiety.  02/11/19   [provider]  digoxin (LANOXIN) 0.125 MG tablet Take 0.125 mg by mouth daily. 03/09/20 03/09/21  [provider]  eltrombopag (PROMACTA) 25 MG tablet Take 1 tablet (25 mg total) by mouth daily. Take on an empty stomach 1 hour before a meal or 2 hours after 10/10/20   Cammie Sickle, MD  furosemide (LASIX) 20 MG tablet Take 20 mg by mouth daily. 05/25/19 02/28/21  [provider]  metoprolol tartrate (LOPRESSOR) 25 MG tablet Take 1 tablet (25 mg total) by mouth 2 (two) times daily. 03/05/21 04/04/21  Wyvonnia Dusky, MD  warfarin (COUMADIN) 2 MG tablet Take 2 mg by mouth daily. 08/07/20   [provider]  warfarin (COUMADIN) 2.5 MG tablet Take 2.5 mg by mouth every other day. 02/07/21   [provider]   Physical Exam: Vitals:   03/06/21 1630 03/06/21 1700 03/06/21 1730 03/06/21 1800  BP: (!) 111/59 112/70 115/74 (!) 116/57  Pulse: 97 89 86 70  Resp: '14 14 16 17  ' Temp:      SpO2: 100% 100% 100% 100%   Constitutional: appears age-appropriate, cachectic,  frail, NAD, calm, comfortable Eyes: PERRL, lids and conjunctivae normal ENMT: Mucous membranes are moist. Posterior pharynx clear of any exudate or lesions. Age-appropriate dentition. Hearing appropriate Neck: normal, supple, no masses, no thyromegaly Respiratory: clear to auscultation bilaterally, no wheezing, no crackles. Normal respiratory effort. No accessory muscle use.  Cardiovascular: Regular rate and rhythm, no murmurs / rubs / gallops. No extremity edema. 2+ pedal pulses. No carotid bruits.  Abdomen: no tenderness, no masses palpated, no hepatosplenomegaly. Bowel sounds positive.  Musculoskeletal: no clubbing /  cyanosis. No joint deformity upper and lower extremities. Good ROM, no contractures, no atrophy. Normal muscle tone.  Skin: no rashes, lesions, ulcers. No induration. pale Neurologic: Sensation intact. Strength 5/5 in all 4.  Psychiatric: Normal judgment and insight. Alert and oriented x 3. Normal mood.   EKG: independently reviewed, showing atrial fibrillation with rate of 112, QTc 412  Chest x-ray on Admission: I personally reviewed and I agree with radiologist reading as below.  DG Chest 2 View  Result Date: 03/06/2021 CLINICAL DATA:  Shortness of breath EXAM: CHEST - 2 VIEW COMPARISON:  03/01/2021 FINDINGS: Unchanged cardiomegaly. Mild interval worsening of pulmonary vascular congestion. Small right pleural effusion is increased since prior examination. Scattered patchy pulmonary opacities are most likely due to pulmonary edema. IMPRESSION: Mild interval worsening of findings of CHF/fluid volume overload. Small right pleural effusion is mildly increased in size since prior exam. Electronically Signed   By: Miachel Roux M.D.   On: 03/06/2021 14:51    Labs on Admission: I have personally reviewed following labs  CBC: Recent Labs  Lab 02/28/21 0101 03/01/21 0608 03/02/21 0525 03/03/21 0550 03/03/21 1845 03/04/21 0436 03/05/21 0615 03/06/21 1422  WBC 14.4*  14.3*   < > 11.3* 11.3*  --  12.0* 10.0 11.4*  NEUTROABS 9.6*  --   --   --   --   --   --   --   HGB 8.7*  8.8*   < > 7.5* 7.0* 8.8* 8.2* 8.5* 8.5*  HCT 27.0*  26.9*   < > 22.2* 20.5* 26.4* 24.2* 24.8* 25.6*  MCV 101.1*  101.5*   < > 100.0 96.7  --  93.8 95.4 98.8  PLT 340  332   < > 275 282  --  283 289 272   < > = values in this interval not displayed.   Basic Metabolic Panel: Recent Labs  Lab 03/02/21 0525 03/03/21 0550 03/04/21 0436 03/05/21 0615 03/06/21 1422  NA 136 137 134* 134* 134*  K 3.7 4.2 4.5 4.5 4.7  CL 101 101 100 99 102  CO2 '27 29 26 24 26  ' GLUCOSE 105* 94 105* 94 146*  BUN 35* 38* 42* 40* 33*   CREATININE 1.58* 1.64* 1.53* 1.44* 1.41*  CALCIUM 8.8* 8.7* 8.4* 8.9 9.0  MG 1.7  --   --   --   --    GFR: Estimated Creatinine Clearance: 38.4 mL/min (A) (by C-G formula based on SCr of 1.41 mg/dL (H)).  Liver Function Tests: Recent Labs  Lab 02/28/21 0101  AST 33  ALT 17  ALKPHOS 95  BILITOT 1.1  PROT 8.5*  ALBUMIN 3.8   Coagulation Profile: Recent Labs  Lab 03/01/21 0608 03/02/21 0525 03/03/21 0550 03/04/21 0436 03/05/21 0615  INR 2.2* 2.3* 2.3* 2.6* 2.5*   CBG: Recent Labs  Lab 03/02/21 1246  GLUCAP 175*   Urine analysis:    Component Value Date/Time   COLORURINE YELLOW (A) 03/06/2021 1849   APPEARANCEUR  CLEAR (A) 03/06/2021 1849   LABSPEC 1.012 03/06/2021 1849   PHURINE 5.0 03/06/2021 1849   GLUCOSEU NEGATIVE 03/06/2021 1849   HGBUR NEGATIVE 03/06/2021 1849   BILIRUBINUR NEGATIVE 03/06/2021 [place] 03/06/2021 1849   PROTEINUR 30 (A) 03/06/2021 1849   NITRITE NEGATIVE 03/06/2021 1849   LEUKOCYTESUR NEGATIVE 03/06/2021 1849   Dr. Tobie Poet [facility]  If 7PM-7AM, please contact overnight-coverage provider If 7AM-7PM, please contact day coverage provider [email or web address]  03/06/2021, 8:20 PM

## 2021-03-06 NOTE — Progress Notes (Signed)
ANTICOAGULATION CONSULT NOTE - Initial Consult  Pharmacy Consult for Warfarin  Indication: atrial fibrillation  Allergies  Allergen Reactions   Bupropion Swelling   Amiodarone Rash    Broke out in dark blue blotches   Diltiazem Rash    Patient Measurements:   Heparin Dosing Weight:   Vital Signs: Temp: 98.2 F (36.8 C) (07/05 1424) BP: 107/74 (07/05 2223) Pulse Rate: 109 (07/05 2223)  Labs: Recent Labs    03/04/21 0436 03/05/21 0615 03/06/21 1422 03/06/21 1614 03/06/21 2222  HGB 8.2* 8.5* 8.5*  --   --   HCT 24.2* 24.8* 25.6*  --   --   PLT 283 289 272  --   --   LABPROT 28.1* 27.1*  --   --  28.7*  INR 2.6* 2.5*  --   --  2.7*  CREATININE 1.53* 1.44* 1.41*  --   --   TROPONINIHS  --   --  48* 42*  --     Estimated Creatinine Clearance: 38.4 mL/min (A) (by C-G formula based on SCr of 1.41 mg/dL (H)).   Medical History: Past Medical History:  Diagnosis Date   Atrial fibrillation (HCC)    GAD (generalized anxiety disorder)    Hypertension    Prediabetes     Medications:  (Not in a hospital admission)   Assessment: Pharmacy consulted to dose warfarin for Afib in this 80 year old male.    7/5 @ 2222 :   INR = 2.7 Pt was on Warfarin 2 mg PO daily, last dose 7/4 AND warfarin 2.5 mg PO QOD, last dose 7/3. Pt takes 2 mg along with 2.5 mg every other day to make dose of 4.5 mg.   Goal of Therapy:  INR 2-3    Plan:  Will continue pt on home warfarin dose to resume on 7/5. Warfarin 2 mg PO daily to start 7/5 along with  Warfarin 2.5 mg PO QOD to resume 7/5. Will recheck INR on 7/6 with AM labs.   Jahden Schara D 03/06/2021,10:53 PM

## 2021-03-06 NOTE — ED Triage Notes (Addendum)
Pt comes with c/o SOB, CP and increased weakness. Pt recently admitted to hospital for pneumonia and d/c yesterday. Pt does have labored breathing with some accessory muscle use.  Pt states he is still SOB and having CP when he breathes in. Pt wear 4L [place] O2 at home.  Pt also has rash noted to abdomen and chest and around to back the patient unsure of what could have caused it.

## 2021-03-06 NOTE — ED Provider Notes (Signed)
[facility] Emergency Department Provider Note ____________________________________________   Event Date/Time   First MD Initiated Contact with Patient 03/06/21 1605     (approximate)  I have reviewed the triage vital signs and the nursing notes.   HISTORY  Chief Complaint Shortness of Breath, Weakness, and Chest Pain    HPI Samuel Mcknight is a 80 y.o. male with PMH as noted below including hypertension, atrial fibrillation, and anxiety who presents with shortness of breath since last night, now somewhat improved, and associated with some pleuritic chest pain.  The patient and wife states that he was discharged from the hospital yesterday and felt relatively well when he went home, but the symptoms started after he went to bed.  He is on 4 L of O2 chronically.  However, the wife is concerned that the oxygen concentrator at home is not giving a full 4 L.  He was maxed out on the machine last night and this morning with minimal relief.  Per the wife, the home nurse arrived in the morning and found the patient to have an O2 saturation in the mid 70s, and then in the low 80s.  The patient reports that Xanax has helped previously with the chest discomfort and he took multiple doses during the night and this morning before he felt somewhat better.   Past Medical History:  Diagnosis Date   Atrial fibrillation ([place])    GAD (generalized anxiety disorder)    Hypertension    Prediabetes     Patient Active Problem List   Diagnosis Date Noted   Shortness of breath 03/06/2021   Multifocal pneumonia 02/28/2021   Normocytic anemia 03/15/2019   Anemia of chronic kidney failure, stage 3 (moderate) 03/15/2019    Past Surgical History:  Procedure Laterality Date   CARDIOVERSION N/A 02/24/2020   Procedure: CARDIOVERSION;  Surgeon: Isaias Cowman, MD;  Location: [facility] ORS;  Service: Cardiovascular;  Laterality: N/A;   supraorbital artery repair  2005   TOOTH  EXTRACTION      Prior to Admission medications   Medication Sig Start Date End Date Taking? Authorizing Provider  ALPRAZolam Duanne Moron) 0.5 MG tablet Take 0.5 mg by mouth 2 (two) times daily as needed for anxiety.  02/11/19   [provider]  digoxin (LANOXIN) 0.125 MG tablet Take 0.125 mg by mouth daily. 03/09/20 03/09/21  [provider]  eltrombopag (PROMACTA) 25 MG tablet Take 1 tablet (25 mg total) by mouth daily. Take on an empty stomach 1 hour before a meal or 2 hours after 10/10/20   Cammie Sickle, MD  furosemide (LASIX) 20 MG tablet Take 20 mg by mouth daily. 05/25/19 02/28/21  [provider]  metoprolol tartrate (LOPRESSOR) 25 MG tablet Take 1 tablet (25 mg total) by mouth 2 (two) times daily. 03/05/21 04/04/21  Wyvonnia Dusky, MD  warfarin (COUMADIN) 2 MG tablet Take 2 mg by mouth daily. 08/07/20   [provider]  warfarin (COUMADIN) 2.5 MG tablet Take 2.5 mg by mouth every other day. 02/07/21   [provider]    Allergies Bupropion, Amiodarone, and Diltiazem  Family History  Problem Relation Age of Onset   Lung cancer Father     Social History Social History   Tobacco Use   Smoking status: Former    Pack years: 0.00    Types: Cigarettes   Smokeless tobacco: Former    Quit date: 2006  Vaping Use   Vaping Use: Former   Quit date: 03/14/2005  Substance Use Topics   Alcohol use: Not Currently    Comment: quit    Drug use: Not Currently    Review of Systems  Constitutional: No fever. Eyes: No redness ENT: No sore throat. Cardiovascular: Positive for chest pain. Respiratory: Positive for shortness of breath. Gastrointestinal: No vomiting or diarrhea.  Genitourinary: Negative for dysuria.  Musculoskeletal: Negative for back pain. Skin: Negative for rash. Neurological: Negative for headache.   ____________________________________________   PHYSICAL EXAM:  VITAL SIGNS: ED Triage Vitals  Enc Vitals Group     BP  03/06/21 1424 108/60     Pulse Rate 03/06/21 1424 93     Resp 03/06/21 1424 (!) 21     Temp 03/06/21 1424 98.2 F (36.8 C)     Temp src --      SpO2 03/06/21 1424 99 %     Weight --      Height --      Head Circumference --      Peak Flow --      Pain Score 03/06/21 1421 4     Pain Loc --      Pain Edu? --      Excl. in [place]? --     Constitutional: Alert and oriented.  Somewhat frail-appearing but in no acute distress. Eyes: Conjunctivae are normal.  EOMI. Head: Atraumatic. Nose: No congestion/rhinnorhea. Mouth/Throat: Mucous membranes are moist.   Neck: Normal range of motion.  Cardiovascular: Normal rate, regular rhythm. Grossly normal heart sounds.  Good peripheral circulation. Respiratory: Normal respiratory effort.  No retractions.  Decreased breath sounds bilateral bases, worse on the right.  Faint rales to bilateral lower lungs. Gastrointestinal: No distention.  Musculoskeletal: No lower extremity edema.  Extremities warm and well perfused.  Neurologic:  Normal speech and language. No gross focal neurologic deficits are appreciated.  Skin:  Skin is warm and dry. No rash noted. Psychiatric: Mood and affect are normal. Speech and behavior are normal.  ____________________________________________   LABS (all labs ordered are listed, but only abnormal results are displayed)  Labs Reviewed  BASIC METABOLIC PANEL - Abnormal; Notable for the following components:      Result Value   Sodium 134 (*)    Glucose, Bld 146 (*)    BUN 33 (*)    Creatinine, Ser 1.41 (*)    GFR, Estimated 50 (*)    All other components within normal limits  CBC - Abnormal; Notable for the following components:   WBC 11.4 (*)    RBC 2.59 (*)    Hemoglobin 8.5 (*)    HCT 25.6 (*)    RDW 18.0 (*)    All other components within normal limits  BRAIN NATRIURETIC PEPTIDE - Abnormal; Notable for the following components:   B Natriuretic Peptide 1,093.7 (*)    All other components within normal  limits  TROPONIN I (HIGH SENSITIVITY) - Abnormal; Notable for the following components:   Troponin I (High Sensitivity) 48 (*)    All other components within normal limits  TROPONIN I (HIGH SENSITIVITY) - Abnormal; Notable for the following components:   Troponin I (High Sensitivity) 42 (*)    All other components within normal limits  RESP PANEL BY RT-PCR (FLU A&B, COVID) [id number]   ____________________________________________  EKG  ED ECG REPORT I, Arta Silence, the attending physician, personally viewed and interpreted this ECG.  Date: 03/06/2021 EKG Time: 1423 Rate: 112 Rhythm: Sinus tachycardia QRS Axis: normal Intervals: normal ST/T Wave abnormalities: Nonspecific ST abnormalities Narrative  Interpretation: Nonspecific abnormalities with no evidence of acute ischemia  ____________________________________________  RADIOLOGY  Chest x-ray interpreted by me shows increased right pleural effusion and increased interstitial opacities consistent with edema  ____________________________________________   PROCEDURES  Procedure(s) performed: No  Procedures  Critical Care performed: No ____________________________________________   INITIAL IMPRESSION / ASSESSMENT AND PLAN / ED COURSE  Pertinent labs & imaging results that were available during my care of the patient were reviewed by me and considered in my medical decision making (see chart for details).   80 year old male with PMH as noted above including hypertension, atrial fibrillation, and anxiety presents the ED with recurrent and worsening shortness of breath and chest pain after being discharged from the hospital yesterday.  I reviewed the past medical records in [place].  The patient was admitted from 6/29-7/4 with sepsis and multifocal pneumonia, as well as a recurrent right pleural effusion.  He had a thoracentesis with 1.9 L of fluid removed.  He has had multiple pleural effusions requiring thoracentesis in  the past.  On exam the patient is chronically weak and frail appearing but in no acute distress.  Vital signs are normal.  O2 saturation is in the high 90s on his normal 4 L by nasal cannula.  There are decreased breath sounds bilaterally and some rales.  There is no significant peripheral edema.  Chest x-ray shows recurrent right pleural effusion and interstitial opacities consistent with mild edema.  Initial troponin is slightly elevated.  Although the patient has a normal O2 saturation on 4 L now, given the apparent hypoxia this morning and the x-ray findings, I suspect that he may be having symptoms due to mild pulmonary edema or recurrent effusion.  I will order IV Lasix.  Since his home oxygen appears not to be sufficient, we will plan for admission for further monitoring.  ----------------------------------------- 6:44 PM on 03/06/2021 -----------------------------------------  I consulted Dr. Tobie Poet from the hospitalist service for admission.  ____________________________________________   FINAL CLINICAL IMPRESSION(S) / ED DIAGNOSES  Final diagnoses:  Acute on chronic respiratory failure with hypoxia (HCC)      NEW MEDICATIONS STARTED DURING THIS VISIT:  New Prescriptions   No medications on file     Note:  This document was prepared using Dragon voice recognition software and may include unintentional dictation errors.    Arta Silence, MD 03/06/21 [phone]

## 2021-03-06 NOTE — Telephone Encounter (Signed)
Thanks for the update Jenn.

## 2021-03-06 NOTE — Telephone Encounter (Signed)
Patient's wife called and stated that patient was just discharged from the hospital on 7/4 and will not be in for appt today due to needing time to recover. She declined to reschedule at this time and stated she would call back when he is ready.

## 2021-03-07 ENCOUNTER — Encounter: Payer: Self-pay | Admitting: Internal Medicine

## 2021-03-07 DIAGNOSIS — R0902 Hypoxemia: Secondary | ICD-10-CM | POA: Diagnosis present

## 2021-03-07 DIAGNOSIS — I130 Hypertensive heart and chronic kidney disease with heart failure and stage 1 through stage 4 chronic kidney disease, or unspecified chronic kidney disease: Secondary | ICD-10-CM | POA: Diagnosis present

## 2021-03-07 DIAGNOSIS — F419 Anxiety disorder, unspecified: Secondary | ICD-10-CM | POA: Diagnosis present

## 2021-03-07 DIAGNOSIS — D472 Monoclonal gammopathy: Secondary | ICD-10-CM | POA: Diagnosis present

## 2021-03-07 DIAGNOSIS — J9621 Acute and chronic respiratory failure with hypoxia: Secondary | ICD-10-CM | POA: Diagnosis present

## 2021-03-07 DIAGNOSIS — Z801 Family history of malignant neoplasm of trachea, bronchus and lung: Secondary | ICD-10-CM | POA: Diagnosis not present

## 2021-03-07 DIAGNOSIS — I350 Nonrheumatic aortic (valve) stenosis: Secondary | ICD-10-CM | POA: Diagnosis present

## 2021-03-07 DIAGNOSIS — I482 Chronic atrial fibrillation, unspecified: Secondary | ICD-10-CM | POA: Diagnosis present

## 2021-03-07 DIAGNOSIS — Z20822 Contact with and (suspected) exposure to covid-19: Secondary | ICD-10-CM | POA: Diagnosis present

## 2021-03-07 DIAGNOSIS — R0602 Shortness of breath: Secondary | ICD-10-CM | POA: Diagnosis present

## 2021-03-07 DIAGNOSIS — Z79899 Other long term (current) drug therapy: Secondary | ICD-10-CM | POA: Diagnosis not present

## 2021-03-07 DIAGNOSIS — Z87891 Personal history of nicotine dependence: Secondary | ICD-10-CM | POA: Diagnosis not present

## 2021-03-07 DIAGNOSIS — N1831 Chronic kidney disease, stage 3a: Secondary | ICD-10-CM | POA: Diagnosis present

## 2021-03-07 DIAGNOSIS — D631 Anemia in chronic kidney disease: Secondary | ICD-10-CM | POA: Diagnosis present

## 2021-03-07 DIAGNOSIS — Z7901 Long term (current) use of anticoagulants: Secondary | ICD-10-CM | POA: Diagnosis not present

## 2021-03-07 DIAGNOSIS — G4733 Obstructive sleep apnea (adult) (pediatric): Secondary | ICD-10-CM | POA: Diagnosis present

## 2021-03-07 DIAGNOSIS — I5033 Acute on chronic diastolic (congestive) heart failure: Secondary | ICD-10-CM | POA: Diagnosis present

## 2021-03-07 LAB — BASIC METABOLIC PANEL
Anion gap: 5 (ref 5–15)
BUN: 31 mg/dL — ABNORMAL HIGH (ref 8–23)
CO2: 31 mmol/L (ref 22–32)
Calcium: 9.1 mg/dL (ref 8.9–10.3)
Chloride: 100 mmol/L (ref 98–111)
Creatinine, Ser: 1.43 mg/dL — ABNORMAL HIGH (ref 0.61–1.24)
GFR, Estimated: 50 mL/min — ABNORMAL LOW (ref >60)
Glucose, Bld: 88 mg/dL (ref 70–99)
Potassium: 4.2 mmol/L (ref 3.5–5.1)
Sodium: 136 mmol/L (ref 135–145)

## 2021-03-07 LAB — CBC
HCT: 26.5 % — ABNORMAL LOW (ref 39.0–52.0)
Hemoglobin: 8.7 g/dL — ABNORMAL LOW (ref 13.0–17.0)
MCH: 32.2 pg (ref 26.0–34.0)
MCHC: 32.8 g/dL (ref 30.0–36.0)
MCV: 98.1 fL (ref 80.0–100.0)
Platelets: 268 10*3/uL (ref 150–400)
RBC: 2.7 MIL/uL — ABNORMAL LOW (ref 4.22–5.81)
RDW: 17.8 % — ABNORMAL HIGH (ref 11.5–15.5)
WBC: 11.3 10*3/uL — ABNORMAL HIGH (ref 4.0–10.5)
nRBC: 0 % (ref 0.0–0.2)

## 2021-03-07 LAB — PROCALCITONIN: Procalcitonin: 0.6 ng/mL

## 2021-03-07 LAB — PROTIME-INR
INR: 2.3 — ABNORMAL HIGH (ref 0.8–1.2)
Prothrombin Time: 25.6 seconds — ABNORMAL HIGH (ref 11.4–15.2)

## 2021-03-07 MED ORDER — SODIUM CHLORIDE 0.9% FLUSH
10.0000 mL | Freq: Two times a day (BID) | INTRAVENOUS | Status: DC
  Administered 2021-03-07 – 2021-03-08 (×2): 10 mL via INTRAVENOUS

## 2021-03-07 NOTE — Progress Notes (Signed)
ANTICOAGULATION CONSULT NOTE - Initial Consult  Pharmacy Consult for Warfarin  Indication: atrial fibrillation  Allergies  Allergen Reactions   Bupropion Swelling   Amiodarone Rash    Broke out in dark blue blotches   Diltiazem Rash    Vital Signs: Temp: 98.7 F (37.1 C) (07/06 0541) Temp Source: Oral (07/06 0541) BP: 112/69 (07/06 0629) Pulse Rate: 98 (07/06 0629)  Labs: Recent Labs    03/05/21 0615 03/06/21 1422 03/06/21 1614 03/06/21 2222 03/07/21 0413  HGB 8.5* 8.5*  --   --  8.7*  HCT 24.8* 25.6*  --   --  26.5*  PLT 289 272  --   --  268  LABPROT 27.1*  --   --  28.7* 25.6*  INR 2.5*  --   --  2.7* 2.3*  CREATININE 1.44* 1.41*  --   --  1.43*  TROPONINIHS  --  48* 42*  --   --     Estimated Creatinine Clearance: 37.9 mL/min (A) (by C-G formula based on SCr of 1.43 mg/dL (H)).   Medical History: Past Medical History:  Diagnosis Date   Atrial fibrillation (HCC)    GAD (generalized anxiety disorder)    Hypertension    Prediabetes     Medications/Assessment: Pharmacy consulted to dose warfarin for Afib in this 80 year old male presenting with SOB.     Pt was on Warfarin 2 mg PO daily, last dose 7/4 AND warfarin 2.5 mg PO QOD, last dose 7/3 (pt takes 2 mg along with 2.5 mg every other day to make dose of 4.5 mg).   Continued pt on home warfarin dose - resumed on 7/5 with 4.5mg  dose (actually administered just after midnight).    INR Warfarin dose 7/5 @ 2222    2.7  4.5 mg 7/6 @ 0413 2.3 2 mg  Goal of Therapy:  INR 2-3   Plan:  Continue home warfarin dosing Will recheck INR daily with AM labs. CBC's as warranted (a minimum of every 3 days)   Lu Duffel, PharmD, BCPS Clinical Pharmacist 03/07/2021 7:22 AM

## 2021-03-07 NOTE — ED Notes (Signed)
Pt. Provided fresh warm blankets, and ice water. Denies further need at this time.

## 2021-03-07 NOTE — Progress Notes (Signed)
PROGRESS NOTE    Samuel Mcknight  SWN:[id number] DOB: 1941/02/25 DOA: 03/06/2021 PCP: Kirk Ruths, MD  [id number]   Assessment & Plan:   Principal Problem:   Acute on chronic respiratory failure with hypoxia [facility]) Active Problems:   Normocytic anemia   Anemia of chronic kidney failure, stage 3 (moderate)   Shortness of breath   MGUS (monoclonal gammopathy of unknown significance)   Atrial fibrillation, chronic ([place])   Essential hypertension   Hypoxia   Samuel Mcknight is a 80 y.o. male with medical history significant for MGUS, atrial fibrillation, hypertension, CKD 3A, on digoxin, anxiety, bicuspid aortic valve, nonrheumatic aortic valve stenosis, severe aortic stenosis, shortness of breath, chronic respiratory failure requiring oxygen supplementation at baseline, exertional shortness of breath, presents emergency department for chief concerns of hypoxia.   Patient was discharged from the hospital on 03/05/2021.   He reports that he felt better when he left the hospital.  On evening of 03/05/2021, he did feel some shortness of breath and he took his alprazolam which did help him feel better.  He then went to sleep.  He reports that he was sleeping when his nurse tried to wake him up.  Per patient, his nursing aid took his vitals, and it was reported that the oxygen level was 74 on 4 L nasal cannula, while patient was sleeping.  They attempted to increase the oxygen level however was unable to, so pt presented to the ED.   # Hypoxia likely due to OSA --pt's outpatient cardio had already ordered a CPAP --CPAP nightly while inpatient  # Chronic hypoxic respiratory failure on 4L O2 at baseline --currently on 2L --Continue supplemental O2 to keep sats >=90%, wean as tolerated  # Acute on chronic CHF # recurrent pleural effusion --CXR showed mild worsening of fluid overload and mild increase in size of pleural effusion on the right --takes only oral lasix 20 mg daily at  home Plan: --cont IV lasix 60 mg BID --may need to be discharged on higher doses of diuretic  # Sepsis, ruled out --No source.  Was just recently treated for PNA.  No fever, leukocytosis or CXR finding to suggest another episode of PNA.  # Atrial fibrillation --cont home metop --cont home digoxin --cont warfarin, pharm dose  # Aortic valve stenosis-outpatient follow-up with cardiology - TAVR was discussed with patient on 01/16/2021 at his cardiology office with Dr. Saralyn Pilar, who was hesitant on TAVR  # Hypertension --cont home metop --cont IV lasix  # CKD 3A-at baseline # Anxiety-alprazolam 0.5 mg twice daily as needed for anxiety resumed # COVID PCR/influenza A/influenza B were negative    DVT prophylaxis: [id number] Code Status: Full code  Family Communication: wife updated at bedside today Level of care: Med-Surg Dispo:   The patient is from: home Anticipated d/c is to: home Anticipated d/c date is: 1-2 days Patient currently is not medically ready to d/c due to: IV lasix   Subjective and Interval History:  Pt's breathing is ok at rest.  Has been sating well on 2L O2.  Good urine output.     Objective: Vitals:   03/07/21 1927 03/07/21 1928 03/07/21 2100 03/07/21 2104  BP: (!) 82/44 (!) 83/53  103/61  Pulse: (!) 43 91  96  Resp: 18     Temp: 98.5 F (36.9 C)     TempSrc: Oral     SpO2: 96%  97%   Weight:      Height:  Intake/Output Summary (Last 24 hours) at 03/08/2021 0229 Last data filed at 03/07/2021 1822 Gross per 24 hour  Intake 840 ml  Output 2000 ml  Net -1160 ml   Filed Weights   03/07/21 0824  Weight: 64.8 kg    Examination:   Constitutional: NAD, AAOx3 HEENT: conjunctivae and lids normal, EOMI CV: No cyanosis.   RESP: normal respiratory effort, on 2L Extremities: No effusions, edema in BLE SKIN: warm, dry Neuro: II - XII grossly intact.   Psych: flat mood and affect.     Data Reviewed: I have personally reviewed following  labs and imaging studies  CBC: Recent Labs  Lab 03/03/21 0550 03/03/21 1845 03/04/21 0436 03/05/21 0615 03/06/21 1422 03/07/21 0413  WBC 11.3*  --  12.0* 10.0 11.4* 11.3*  HGB 7.0* 8.8* 8.2* 8.5* 8.5* 8.7*  HCT 20.5* 26.4* 24.2* 24.8* 25.6* 26.5*  MCV 96.7  --  93.8 95.4 98.8 98.1  PLT 282  --  283 289 272 364   Basic Metabolic Panel: Recent Labs  Lab 03/02/21 0525 03/03/21 0550 03/04/21 0436 03/05/21 0615 03/06/21 1422 03/07/21 0413  NA 136 137 134* 134* 134* 136  K 3.7 4.2 4.5 4.5 4.7 4.2  CL 101 101 100 99 102 100  CO2 27 29 26 24 26 31   GLUCOSE 105* 94 105* 94 146* 88  BUN 35* 38* 42* 40* 33* 31*  CREATININE 1.58* 1.64* 1.53* 1.44* 1.41* 1.43*  CALCIUM 8.8* 8.7* 8.4* 8.9 9.0 9.1  MG 1.7  --   --   --   --   --    GFR: Estimated Creatinine Clearance: 37.8 mL/min (A) (by C-G formula based on SCr of 1.43 mg/dL (H)). Liver Function Tests: No results for input(s): AST, ALT, ALKPHOS, BILITOT, PROT, ALBUMIN in the last 168 hours. No results for input(s): LIPASE, AMYLASE in the last 168 hours. No results for input(s): AMMONIA in the last 168 hours. Coagulation Profile: Recent Labs  Lab 03/03/21 0550 03/04/21 0436 03/05/21 0615 03/06/21 2222 03/07/21 0413  INR 2.3* 2.6* 2.5* 2.7* 2.3*   Cardiac Enzymes: No results for input(s): CKTOTAL, CKMB, CKMBINDEX, TROPONINI in the last 168 hours. BNP (last 3 results) No results for input(s): PROBNP in the last 8760 hours. HbA1C: No results for input(s): HGBA1C in the last 72 hours. CBG: Recent Labs  Lab 03/02/21 1246  GLUCAP 175*   Lipid Profile: No results for input(s): CHOL, HDL, LDLCALC, TRIG, CHOLHDL, LDLDIRECT in the last 72 hours. Thyroid Function Tests: No results for input(s): TSH, T4TOTAL, FREET4, T3FREE, THYROIDAB in the last 72 hours. Anemia Panel: No results for input(s): VITAMINB12, FOLATE, FERRITIN, TIBC, IRON, RETICCTPCT in the last 72 hours. Sepsis Labs: Recent Labs  Lab 03/06/21 1928  03/07/21 0413  PROCALCITON 0.60 0.60    Recent Results (from the past 240 hour(s))  Resp Panel by RT-PCR (Flu A&B, Covid) Nasopharyngeal Swab     Status: None   Collection Time: 02/28/21  1:01 AM   Specimen: Nasopharyngeal Swab; Nasopharyngeal(NP) swabs in vial transport medium  Result Value Ref Range Status   SARS Coronavirus 2 by RT PCR NEGATIVE NEGATIVE Final    Comment: (NOTE) SARS-CoV-2 target nucleic acids are NOT DETECTED.  The SARS-CoV-2 RNA is generally detectable in upper respiratory specimens during the acute phase of infection. The lowest concentration of SARS-CoV-2 viral copies this assay can detect is 138 copies/mL. A negative result does not preclude SARS-Cov-2 infection and should not be used as the sole basis for treatment or other  patient management decisions. A negative result may occur with  improper specimen collection/handling, submission of specimen other than nasopharyngeal swab, presence of viral mutation(s) within the areas targeted by this assay, and inadequate number of viral copies(<138 copies/mL). A negative result must be combined with clinical observations, patient history, and epidemiological information. The expected result is Negative.  Fact Sheet for Patients:  EntrepreneurPulse.com.au  Fact Sheet for Healthcare Providers:  IncredibleEmployment.be  This test is no t yet approved or cleared by the [place] FDA and  has been authorized for detection and/or diagnosis of SARS-CoV-2 by FDA under an Emergency Use Authorization (EUA). This EUA will remain  in effect (meaning this test can be used) for the duration of the COVID-19 declaration under Section 564(b)(1) of the Act, 21 U.S.C.section 360bbb-3(b)(1), unless the authorization is terminated  or revoked sooner.       Influenza A by PCR NEGATIVE NEGATIVE Final   Influenza B by PCR NEGATIVE NEGATIVE Final    Comment: (NOTE) The Xpert Xpress  SARS-CoV-2/FLU/RSV plus assay is intended as an aid in the diagnosis of influenza from Nasopharyngeal swab specimens and should not be used as a sole basis for treatment. Nasal washings and aspirates are unacceptable for Xpert Xpress SARS-CoV-2/FLU/RSV testing.  Fact Sheet for Patients: EntrepreneurPulse.com.au  Fact Sheet for Healthcare Providers: IncredibleEmployment.be  This test is not yet approved or cleared by the [place] FDA and has been authorized for detection and/or diagnosis of SARS-CoV-2 by FDA under an Emergency Use Authorization (EUA). This EUA will remain in effect (meaning this test can be used) for the duration of the COVID-19 declaration under Section 564(b)(1) of the Act, 21 U.S.C. section 360bbb-3(b)(1), unless the authorization is terminated or revoked.  Performed at [facility], [address] [place], [place] [address]   Blood Culture (routine x 2)     Status: None   Collection Time: 02/28/21  1:01 AM   Specimen: BLOOD  Result Value Ref Range Status   Specimen Description BLOOD LEFT WRIST  Final   Special Requests   Final    BOTTLES DRAWN AEROBIC AND ANAEROBIC Blood Culture adequate volume   Culture   Final    NO GROWTH 5 DAYS Performed at [facility], [address] [place], [place] [address]    Report Status 03/05/2021 FINAL  Final  Blood Culture (routine x 2)     Status: None   Collection Time: 02/28/21  1:33 AM   Specimen: BLOOD  Result Value Ref Range Status   Specimen Description BLOOD RIGHT ASSIST CONTROL  Final   Special Requests   Final    BOTTLES DRAWN AEROBIC AND ANAEROBIC Blood Culture adequate volume   Culture   Final    NO GROWTH 5 DAYS Performed at [facility], [address] [place], [place] [address]    Report Status 03/05/2021 FINAL  Final  Urine culture     Status: Abnormal   Collection Time: 02/28/21  6:56 AM   Specimen: In/Out Cath Urine  Result  Value Ref Range Status   Specimen Description   Final    IN/OUT CATH URINE Performed at [facility], [address], [place], [place] [address]    Special Requests   Final    NONE Performed at [facility], [address] [place], [place] [address]    Culture MULTIPLE SPECIES PRESENT, SUGGEST RECOLLECTION (A)  Final   Report Status 03/01/2021 FINAL  Final  Acid Fast Smear (AFB)  Status: None   Collection Time: 03/01/21  4:45 PM   Specimen: PATH Cytology Pleural fluid  Result Value Ref Range Status   AFB Specimen Processing Concentration  Final   Acid Fast Smear Negative  Final    Comment: (NOTE) Performed At: [facility] [place], [place] [id number] Rush Farmer MD OF:[id number]    Source (AFB) PLEURAL  Final    Comment: Performed at [facility], [address] [place], [place] [address]  Body fluid culture w Gram Stain     Status: None   Collection Time: 03/01/21  4:45 PM   Specimen: PATH Cytology Pleural fluid  Result Value Ref Range Status   Specimen Description   Final    PLEURAL Performed at [facility], [address] [place], [place] [address]    Special Requests   Final    NONE Performed at [facility], [address] [place], [place] [address]    Gram Stain   Final    FEW WBC PRESENT, PREDOMINANTLY MONONUCLEAR NO ORGANISMS SEEN    Culture   Final    NO GROWTH 3 DAYS Performed at [facility], [place] [address] [place], [place] [address]    Report Status 03/05/2021 FINAL  Final  Resp Panel by RT-PCR (Flu A&B, Covid) Nasopharyngeal Swab     Status: None   Collection Time: 03/06/21  6:46 PM   Specimen: Nasopharyngeal Swab; Nasopharyngeal(NP) swabs in vial transport medium  Result Value Ref Range Status   SARS Coronavirus 2 by RT PCR NEGATIVE NEGATIVE Final    Comment: (NOTE) SARS-CoV-2 target nucleic acids are NOT DETECTED.  The SARS-CoV-2 RNA is generally detectable in  upper respiratory specimens during the acute phase of infection. The lowest concentration of SARS-CoV-2 viral copies this assay can detect is 138 copies/mL. A negative result does not preclude SARS-Cov-2 infection and should not be used as the sole basis for treatment or other patient management decisions. A negative result may occur with  improper specimen collection/handling, submission of specimen other than nasopharyngeal swab, presence of viral mutation(s) within the areas targeted by this assay, and inadequate number of viral copies(<138 copies/mL). A negative result must be combined with clinical observations, patient history, and epidemiological information. The expected result is Negative.  Fact Sheet for Patients:  EntrepreneurPulse.com.au  Fact Sheet for Healthcare Providers:  IncredibleEmployment.be  This test is no t yet approved or cleared by the [place] FDA and  has been authorized for detection and/or diagnosis of SARS-CoV-2 by FDA under an Emergency Use Authorization (EUA). This EUA will remain  in effect (meaning this test can be used) for the duration of the COVID-19 declaration under Section 564(b)(1) of the Act, 21 U.S.C.section 360bbb-3(b)(1), unless the authorization is terminated  or revoked sooner.       Influenza A by PCR NEGATIVE NEGATIVE Final   Influenza B by PCR NEGATIVE NEGATIVE Final    Comment: (NOTE) The Xpert Xpress SARS-CoV-2/FLU/RSV plus assay is intended as an aid in the diagnosis of influenza from Nasopharyngeal swab specimens and should not be used as a sole basis for treatment. Nasal washings and aspirates are unacceptable for Xpert Xpress SARS-CoV-2/FLU/RSV testing.  Fact Sheet for Patients: EntrepreneurPulse.com.au  Fact Sheet for Healthcare Providers: IncredibleEmployment.be  This test is not yet approved or cleared by the [place] FDA and has been  authorized for detection and/or diagnosis of SARS-CoV-2 by FDA under an Emergency Use Authorization (EUA). This EUA will remain in  effect (meaning this test can be used) for the duration of the COVID-19 declaration under Section 564(b)(1) of the Act, 21 U.S.C. section 360bbb-3(b)(1), unless the authorization is terminated or revoked.  Performed at [facility], [address] [place], [place] [address]       Radiology Studies: DG Chest 2 View  Result Date: 03/06/2021 CLINICAL DATA:  Shortness of breath EXAM: CHEST - 2 VIEW COMPARISON:  03/01/2021 FINDINGS: Unchanged cardiomegaly. Mild interval worsening of pulmonary vascular congestion. Small right pleural effusion is increased since prior examination. Scattered patchy pulmonary opacities are most likely due to pulmonary edema. IMPRESSION: Mild interval worsening of findings of CHF/fluid volume overload. Small right pleural effusion is mildly increased in size since prior exam. Electronically Signed   By: Miachel Roux M.D.   On: 03/06/2021 14:51     Scheduled Meds:  digoxin  0.125 mg Oral Daily   eltrombopag  25 mg Oral QAC breakfast   furosemide  60 mg Intravenous BID   metoprolol tartrate  25 mg Oral BID   sodium chloride flush  10 mL Intravenous Q12H   warfarin  2 mg Oral q1600   warfarin  2.5 mg Oral QODAY   Warfarin - Pharmacist Dosing Inpatient   Does not apply q1600   Continuous Infusions:   LOS: 1 day     Enzo Bi, MD [facility] If 7PM-7AM, please contact night-coverage 03/08/2021, 2:29 AM

## 2021-03-08 LAB — CBC
HCT: 24.6 % — ABNORMAL LOW (ref 39.0–52.0)
Hemoglobin: 8.1 g/dL — ABNORMAL LOW (ref 13.0–17.0)
MCH: 31.5 pg (ref 26.0–34.0)
MCHC: 32.9 g/dL (ref 30.0–36.0)
MCV: 95.7 fL (ref 80.0–100.0)
Platelets: 284 10*3/uL (ref 150–400)
RBC: 2.57 MIL/uL — ABNORMAL LOW (ref 4.22–5.81)
RDW: 17.6 % — ABNORMAL HIGH (ref 11.5–15.5)
WBC: 12.2 10*3/uL — ABNORMAL HIGH (ref 4.0–10.5)
nRBC: 0 % (ref 0.0–0.2)

## 2021-03-08 LAB — BASIC METABOLIC PANEL
Anion gap: 5 (ref 5–15)
BUN: 36 mg/dL — ABNORMAL HIGH (ref 8–23)
CO2: 31 mmol/L (ref 22–32)
Calcium: 8.7 mg/dL — ABNORMAL LOW (ref 8.9–10.3)
Chloride: 99 mmol/L (ref 98–111)
Creatinine, Ser: 1.51 mg/dL — ABNORMAL HIGH (ref 0.61–1.24)
GFR, Estimated: 46 mL/min — ABNORMAL LOW (ref >60)
Glucose, Bld: 88 mg/dL (ref 70–99)
Potassium: 4.1 mmol/L (ref 3.5–5.1)
Sodium: 135 mmol/L (ref 135–145)

## 2021-03-08 LAB — PROTIME-INR
INR: 2.7 — ABNORMAL HIGH (ref 0.8–1.2)
Prothrombin Time: 29 seconds — ABNORMAL HIGH (ref 11.4–15.2)

## 2021-03-08 LAB — PROCALCITONIN: Procalcitonin: 0.51 ng/mL

## 2021-03-08 LAB — MAGNESIUM: Magnesium: 1.8 mg/dL (ref 1.7–2.4)

## 2021-03-08 MED ORDER — DIGOXIN 125 MCG PO TABS
0.1250 mg | ORAL_TABLET | Freq: Every day | ORAL | Status: DC
  Administered 2021-03-08: 0.125 mg via ORAL
  Filled 2021-03-08: qty 1

## 2021-03-08 MED ORDER — FUROSEMIDE 40 MG PO TABS: 40.0000 mg | ORAL_TABLET | Freq: Every day | ORAL | 0 refills | Status: AC

## 2021-03-08 MED ORDER — WARFARIN SODIUM 2.5 MG PO TABS
4.5000 mg | ORAL_TABLET | Freq: Once | ORAL | Status: DC
  Filled 2021-03-08: qty 1

## 2021-03-08 NOTE — Plan of Care (Signed)

## 2021-03-08 NOTE — Progress Notes (Signed)
[facility] for Warfarin  Indication: atrial fibrillation  Allergies  Allergen Reactions   Bupropion Swelling   Amiodarone Rash    Broke out in dark blue blotches   Diltiazem Rash    Vital Signs: Temp: 97.8 F (36.6 C) (07/07 0709) Temp Source: Oral (07/07 0709) BP: 94/52 (07/07 0709) Pulse Rate: 70 (07/07 0709)  Labs: Recent Labs    03/06/21 1422 03/06/21 1614 03/06/21 2222 03/07/21 0413 03/08/21 0518  HGB 8.5*  --   --  8.7* 8.1*  HCT 25.6*  --   --  26.5* 24.6*  PLT 272  --   --  268 284  LABPROT  --   --  28.7* 25.6* 29.0*  INR  --   --  2.7* 2.3* 2.7*  CREATININE 1.41*  --   --  1.43* 1.51*  TROPONINIHS 48* 42*  --   --   --     Estimated Creatinine Clearance: 36.5 mL/min (A) (by C-G formula based on SCr of 1.51 mg/dL (H)).   Medical History: Past Medical History:  Diagnosis Date   Atrial fibrillation (HCC)    GAD (generalized anxiety disorder)    Hypertension    Prediabetes     Medications/Assessment: Pharmacy consulted to dose warfarin for Afib in this 80 year old male presenting with SOB.     Pt was on Warfarin 2 mg PO daily, last dose 7/4 AND warfarin 2.5 mg PO QOD, last dose 7/3 (pt takes 2 mg along with 2.5 mg every other day to make dose of 4.5 mg).   INR Warfarin dose 7/5   2.7  4.5 mg 7/6   2.3 2 mg 7/7 2.7 4.5 mg   Goal of Therapy:  INR 2-3   Plan:  INR therapeutic. Will give patient 4.5 mg today (home dose 2 mg QD + 2.5 mg QOD). On 7/5 INR was 2.7 and pt received 4.5 mg. Daily INR ordered. CBC at least every 3 days.   Oswald Hillock, PharmD, BCPS Clinical Pharmacist 03/08/2021 8:08 AM

## 2021-03-08 NOTE — Discharge Summary (Addendum)
Physician Discharge Summary   Samuel Mcknight  male DOB: 1940-09-23  RCV:[id number]  PCP: Kirk Ruths, MD  Admit date: 03/06/2021 Discharge date: 03/08/2021  Admitted From: home Disposition:  home Home Health: Yes CODE STATUS: Full code  Discharge Instructions     Discharge instructions   Complete by: As directed    As we discussed, your low oxygen saturation level at home was likely due to sleep apnea, and your PCP has already ordered a CPAP for you.  While awake, your oxygen saturation has been in high 90's just on 2L supplemental oxygen.  Your cardiologist wants you to increase your lasix at home to 40 mg daily.   Dr. Enzo Bi [facility] Hospital Course:  For full details, please see H&P, progress notes, consult notes and ancillary notes.  Briefly,  Samuel Mcknight is a 80 y.o. male with medical history significant for MGUS, atrial fibrillation, hypertension, CKD 3A, on digoxin, anxiety, bicuspid aortic valve, nonrheumatic aortic valve stenosis, severe aortic stenosis, chronic shortness of breath, chronic respiratory failure requiring oxygen supplementation at baseline, presented to emergency department for chief concerns of hypoxia.   Patient was discharged from the hospital on 03/05/2021.   He reports that he felt better when he left the hospital.  On evening of 03/05/2021, he did feel some shortness of breath and he took his alprazolam which did help him feel better.  He then went to sleep.  He reported that he was sleeping when his [place] tried to wake him up.  Per patient, his nursing aid took his vitals, and it was reported that the oxygen level was 74 on 4 L nasal cannula, while patient was sleeping.  They attempted to increase the oxygen level however was unable to, so pt presented to the ED.   # Hypoxia likely due to OSA --Since presentation, pt has not needed >2L to maintain saturation >92%.  Desat event noted at home was likely due to OSA. --according to  outside record, pt's PCP had already ordered a CPAP --Pt received CPAP nightly while inpatient, however, he said he could not tolerate it.     # Chronic hypoxic respiratory failure on 4L O2 at baseline --Has been maintaining saturation >92% on just 2L O2 during entire hospitalization.  Pt does not need higher level of home O2.   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 --after discussing with pt's outpatient cardiologist, pt's home lasix was increased to 40 mg daily.  # Sepsis, ruled out --No source.  Was just recently treated for PNA.  No fever, leukocytosis or CXR finding to suggest another episode of PNA.  # Atrial fibrillation --cont home metop --cont home digoxin --cont warfarin  # Aortic valve stenosis -outpatient follow-up with cardiology - TAVR was discussed with patient on 01/16/2021 at his cardiology office with Dr. Saralyn Pilar, who was hesitant on TAVR  # Hypertension --cont home metop --cont lasix  # CKD 3A-at baseline # Anxiety-alprazolam 0.5 mg twice daily as needed for anxiety resumed   Discharge Diagnoses:  Principal Problem:   Acute on chronic respiratory failure with hypoxia ([place]) Active Problems:   Normocytic anemia   Anemia  of chronic kidney failure, stage 3 (moderate)   Shortness of breath   MGUS (monoclonal gammopathy of unknown significance)   Atrial fibrillation, chronic (HCC)   Essential hypertension   Hypoxia   30 Day Unplanned Readmission Risk Score    Flowsheet Row ED to Hosp-Admission (Current) from 03/06/2021 in [facility]  30 Day Unplanned Readmission Risk Score (%) 18.05 Filed at 03/08/2021 0801       This score is the  patient's risk of an unplanned readmission within 30 days of being discharged (0 -100%). The score is based on dignosis, age, lab data, medications, orders, and past utilization.   Low:  0-14.9   Medium: 15-21.9   High: 22-29.9   Extreme: 30 and above          Discharge Instructions:  Allergies as of 03/08/2021       Reactions   Bupropion Swelling   Amiodarone Rash   Broke out in dark blue blotches   Diltiazem Rash        Medication List     TAKE these medications    ALPRAZolam 0.5 MG tablet Commonly known as: XANAX Take 0.5 mg by mouth 2 (two) times daily as needed for anxiety.   digoxin 0.125 MG tablet Commonly known as: LANOXIN Take 0.125 mg by mouth daily.   eltrombopag 25 MG tablet Commonly known as: PROMACTA Take 1 tablet (25 mg total) by mouth daily. Take on an empty stomach 1 hour before a meal or 2 hours after   furosemide 40 MG tablet Commonly known as: LASIX Take 1 tablet (40 mg total) by mouth daily. Increase from 20 mg daily. What changed:  medication strength how much to take additional instructions   metoprolol tartrate 25 MG tablet Commonly known as: LOPRESSOR Take 1 tablet (25 mg total) by mouth 2 (two) times daily.   warfarin 2 MG tablet Commonly known as: COUMADIN Take 2 mg by mouth daily.   warfarin 2.5 MG tablet Commonly known as: COUMADIN Take 2.5 mg by mouth every other day.         Follow-up Information     Paraschos, Alexander, MD Follow up in 2 week(s).   Specialty: Cardiology Contact information: [facility] [place] [address] [phone]         Kirk Ruths, MD Follow up in 1 week(s).   Specialty: Internal Medicine Contact information: [place] [address] [phone]                 Allergies  Allergen Reactions   Bupropion Swelling   Amiodarone Rash    Broke out in dark blue blotches    Diltiazem Rash     The results of significant diagnostics from this hospitalization (including imaging, microbiology, ancillary and laboratory) are listed below for reference.   Consultations:   Procedures/Studies: DG Chest 2 View  Result Date: 03/06/2021 CLINICAL DATA:  Shortness of breath EXAM: CHEST - 2 VIEW COMPARISON:  03/01/2021 FINDINGS: Unchanged cardiomegaly. Mild interval worsening of pulmonary vascular congestion. Small right pleural effusion is increased since prior examination. Scattered patchy pulmonary opacities are most likely due to pulmonary edema. IMPRESSION: Mild interval worsening of findings of CHF/fluid volume overload. Small right pleural effusion is mildly increased in size since prior exam. Electronically Signed   By: Miachel Roux M.D.   On: 03/06/2021 14:51   CT Angio Chest PE W and/or Wo Contrast  Result  Date: 02/28/2021 CLINICAL DATA:  Shortness of breath EXAM: CT ANGIOGRAPHY CHEST WITH CONTRAST TECHNIQUE: Multidetector CT imaging of the chest was performed using the standard protocol during bolus administration of intravenous contrast. Multiplanar CT image reconstructions and MIPs were obtained to evaluate the vascular anatomy. CONTRAST:  31mL OMNIPAQUE IOHEXOL 350 MG/ML SOLN COMPARISON:  11/07/2020 FINDINGS: Cardiovascular: No filling defects in the pulmonary arteries to suggest pulmonary emboli. Cardiomegaly. Aneurysmal dilatation of the ascending thoracic aorta measures 4.7 cm. Mildly dilated main pulmonary artery measures 4.1 cm. Coronary artery and aortic calcifications. Mediastinum/Nodes: No mediastinal, hilar, or axillary adenopathy. Trachea and esophagus are unremarkable. Thyroid unremarkable. Lungs/Pleura: Mild emphysema. Large right pleural effusion and small left pleural effusion. Airspace disease in the right lower lobe and right middle lobe could reflect atelectasis or pneumonia. Patchy ground-glass peripheral opacities in the left lower lobe. Upper Abdomen:  Imaging into the upper abdomen demonstrates no acute findings. Musculoskeletal: Chest wall soft tissues are unremarkable. No acute bony abnormality. Review of the MIP images confirms the above findings. IMPRESSION: No evidence of pulmonary embolus. Large right pleural effusion and small left pleural effusion. Airspace disease in the right lower lobe and right middle lobe could reflect atelectasis or pneumonia. Patchy opacities in the left lower lobe concerning for early pneumonia. 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 Recommend semi-annual imaging followup by CTA or MRA and referral to cardiothoracic surgery if not already obtained. This recommendation follows 2010 ACCF/AHA/AATS/ACR/ASA/SCA/SCAI/SIR/STS/SVM Guidelines for the Diagnosis and Management of Patients With Thoracic Aortic Disease. Circulation. 2010; 121: N277-O242. Aortic aneurysm NOS (ICD10-I71.9) Aortic Atherosclerosis (ICD10-I70.0) and Emphysema (ICD10-J43.9). Electronically Signed   By: Rolm Baptise M.D.   On: 02/28/2021 02:52   DG Chest Port 1 View  Result Date: 03/01/2021 CLINICAL DATA:  Status post right thoracentesis. EXAM: PORTABLE CHEST 1 VIEW COMPARISON:  02/28/2021 FINDINGS: Stable cardiac enlargement. Significant diminishment in right pleural fluid volume after thoracentesis. There may be a tiny amount of pleural air at the lateral right lung base. Improved aeration of the right lower lung. Slight decrease in pulmonary edema. IMPRESSION: Decrease in right pleural fluid volume after thoracentesis with improved aeration of the right lung. Tiny amount of pleural air present adjacent to the lung at the right lung base. Slight decrease in pulmonary edema. Electronically Signed   By: Aletta Edouard M.D.   On: 03/01/2021 17:14   DG Chest Portable 1 View  Result Date: 02/28/2021 CLINICAL DATA:  Shortness of breath EXAM: PORTABLE CHEST 1 VIEW COMPARISON:   10/27/2020 FINDINGS: Small right pleural effusion with basilar atelectasis. Mild cardiomegaly. Right-greater-than-left mild interstitial opacity. IMPRESSION: Cardiomegaly, small right pleural effusion and mild pulmonary edema. Electronically Signed   By: Ulyses Jarred M.D.   On: 02/28/2021 01:20   US THORACENTESIS ASP PLEURAL SPACE W/IMG GUIDE  Result Date: 03/01/2021 CLINICAL DATA:  Shortness of breath and moderate to large right pleural effusion. EXAM: ULTRASOUND GUIDED RIGHT THORACENTESIS COMPARISON:  None. PROCEDURE: An ultrasound guided thoracentesis was thoroughly discussed with the patient and questions answered. The benefits, risks, alternatives and complications were also discussed. The patient understands and wishes to proceed with the procedure. Written consent was obtained. Ultrasound was performed to localize and mark an adequate pocket of fluid in the right chest. The area was then prepped and draped in the normal sterile fashion. 1% Lidocaine was used for local anesthesia. Under ultrasound guidance a 6 French Safe-T-Centesis catheter was introduced. Thoracentesis was performed. The catheter was removed and a  dressing applied. COMPLICATIONS: None FINDINGS: A total of approximately 1.9 L of amber fluid was removed. A fluid sample was sent for laboratory analysis. IMPRESSION: Successful ultrasound guided right thoracentesis yielding 1.9 L of pleural fluid. Electronically Signed   By: Aletta Edouard M.D.   On: 03/01/2021 17:12      Labs: BNP (last 3 results) Recent Labs    02/28/21 0101 03/06/21 1728  BNP 1,033.9* 5,009.3*   Basic Metabolic Panel: Recent Labs  Lab 03/02/21 0525 03/03/21 0550 03/04/21 0436 03/05/21 0615 03/06/21 1422 03/07/21 0413 03/08/21 0518  NA 136   < > 134* 134* 134* 136 135  K 3.7   < > 4.5 4.5 4.7 4.2 4.1  CL 101   < > 100 99 102 100 99  CO2 27   < > 26 24 26 31 31   GLUCOSE 105*   < > 105* 94 146* 88 88  BUN 35*   < > 42* 40* 33* 31* 36*  CREATININE  1.58*   < > 1.53* 1.44* 1.41* 1.43* 1.51*  CALCIUM 8.8*   < > 8.4* 8.9 9.0 9.1 8.7*  MG 1.7  --   --   --   --   --  1.8   < > = values in this interval not displayed.   Liver Function Tests: No results for input(s): AST, ALT, ALKPHOS, BILITOT, PROT, ALBUMIN in the last 168 hours. No results for input(s): LIPASE, AMYLASE in the last 168 hours. No results for input(s): AMMONIA in the last 168 hours. CBC: Recent Labs  Lab 03/04/21 0436 03/05/21 0615 03/06/21 1422 03/07/21 0413 03/08/21 0518  WBC 12.0* 10.0 11.4* 11.3* 12.2*  HGB 8.2* 8.5* 8.5* 8.7* 8.1*  HCT 24.2* 24.8* 25.6* 26.5* 24.6*  MCV 93.8 95.4 98.8 98.1 95.7  PLT 283 289 272 268 284   Cardiac Enzymes: No results for input(s): CKTOTAL, CKMB, CKMBINDEX, TROPONINI in the last 168 hours. BNP: Invalid input(s): POCBNP CBG: Recent Labs  Lab 03/02/21 1246  GLUCAP 175*   D-Dimer No results for input(s): DDIMER in the last 72 hours. Hgb A1c No results for input(s): HGBA1C in the last 72 hours. Lipid Profile No results for input(s): CHOL, HDL, LDLCALC, TRIG, CHOLHDL, LDLDIRECT in the last 72 hours. Thyroid function studies No results for input(s): TSH, T4TOTAL, T3FREE, THYROIDAB in the last 72 hours.  Invalid input(s): FREET3 Anemia work up No results for input(s): VITAMINB12, FOLATE, FERRITIN, TIBC, IRON, RETICCTPCT in the last 72 hours. Urinalysis    Component Value Date/Time   COLORURINE YELLOW (A) 03/06/2021 1849   APPEARANCEUR CLEAR (A) 03/06/2021 1849   LABSPEC 1.012 03/06/2021 1849   PHURINE 5.0 03/06/2021 1849   GLUCOSEU NEGATIVE 03/06/2021 1849   HGBUR NEGATIVE 03/06/2021 1849   BILIRUBINUR NEGATIVE 03/06/2021 [place] 03/06/2021 1849   PROTEINUR 30 (A) 03/06/2021 1849   NITRITE NEGATIVE 03/06/2021 1849   LEUKOCYTESUR NEGATIVE 03/06/2021 1849   Sepsis Labs Invalid input(s): PROCALCITONIN,  WBC,  LACTICIDVEN Microbiology Recent Results (from the past 240 hour(s))  Resp Panel by  RT-PCR (Flu A&B, Covid) Nasopharyngeal Swab     Status: None   Collection Time: 02/28/21  1:01 AM   Specimen: Nasopharyngeal Swab; Nasopharyngeal(NP) swabs in vial transport medium  Result Value Ref Range Status   SARS Coronavirus 2 by RT PCR NEGATIVE NEGATIVE Final    Comment: (NOTE) SARS-CoV-2 target nucleic acids are NOT DETECTED.  The SARS-CoV-2 RNA is generally detectable in upper respiratory specimens during the acute phase  of infection. The lowest concentration of SARS-CoV-2 viral copies this assay can detect is 138 copies/mL. A negative result does not preclude SARS-Cov-2 infection and should not be used as the sole basis for treatment or other patient management decisions. A negative result may occur with  improper specimen collection/handling, submission of specimen other than nasopharyngeal swab, presence of viral mutation(s) within the areas targeted by this assay, and inadequate number of viral copies(<138 copies/mL). A negative result must be combined with clinical observations, patient history, and epidemiological information. The expected result is Negative.  Fact Sheet for Patients:  EntrepreneurPulse.com.au  Fact Sheet for Healthcare Providers:  IncredibleEmployment.be  This test is no t yet approved or cleared by the [place] FDA and  has been authorized for detection and/or diagnosis of SARS-CoV-2 by FDA under an Emergency Use Authorization (EUA). This EUA will remain  in effect (meaning this test can be used) for the duration of the COVID-19 declaration under Section 564(b)(1) of the Act, 21 U.S.C.section 360bbb-3(b)(1), unless the authorization is terminated  or revoked sooner.       Influenza A by PCR NEGATIVE NEGATIVE Final   Influenza B by PCR NEGATIVE NEGATIVE Final    Comment: (NOTE) The Xpert Xpress SARS-CoV-2/FLU/RSV plus assay is intended as an aid in the diagnosis of influenza from Nasopharyngeal swab  specimens and should not be used as a sole basis for treatment. Nasal washings and aspirates are unacceptable for Xpert Xpress SARS-CoV-2/FLU/RSV testing.  Fact Sheet for Patients: EntrepreneurPulse.com.au  Fact Sheet for Healthcare Providers: IncredibleEmployment.be  This test is not yet approved or cleared by the [place] FDA and has been authorized for detection and/or diagnosis of SARS-CoV-2 by FDA under an Emergency Use Authorization (EUA). This EUA will remain in effect (meaning this test can be used) for the duration of the COVID-19 declaration under Section 564(b)(1) of the Act, 21 U.S.C. section 360bbb-3(b)(1), unless the authorization is terminated or revoked.  Performed at [facility], [address] [place], [place] [address]   Blood Culture (routine x 2)     Status: None   Collection Time: 02/28/21  1:01 AM   Specimen: BLOOD  Result Value Ref Range Status   Specimen Description BLOOD LEFT WRIST  Final   Special Requests   Final    BOTTLES DRAWN AEROBIC AND ANAEROBIC Blood Culture adequate volume   Culture   Final    NO GROWTH 5 DAYS Performed at [facility], [address] [place], [place] [address]    Report Status 03/05/2021 FINAL  Final  Blood Culture (routine x 2)     Status: None   Collection Time: 02/28/21  1:33 AM   Specimen: BLOOD  Result Value Ref Range Status   Specimen Description BLOOD RIGHT ASSIST CONTROL  Final   Special Requests   Final    BOTTLES DRAWN AEROBIC AND ANAEROBIC Blood Culture adequate volume   Culture   Final    NO GROWTH 5 DAYS Performed at [facility], [address], [place], [place] [address]    Report Status 03/05/2021 FINAL  Final  Urine culture     Status: Abnormal   Collection Time: 02/28/21  6:56 AM   Specimen: In/Out Cath Urine  Result Value Ref Range Status   Specimen Description   Final    IN/OUT CATH URINE Performed at [facility], [address], [place], [place] [address]    Special Requests   Final    NONE Performed at  [facility], [address] [place], [place] [address]    Culture MULTIPLE SPECIES PRESENT, SUGGEST RECOLLECTION (A)  Final   Report Status 03/01/2021 FINAL  Final  Acid Fast Smear (AFB)     Status: None   Collection Time: 03/01/21  4:45 PM   Specimen: PATH Cytology Pleural fluid  Result Value Ref Range Status   AFB Specimen Processing Concentration  Final   Acid Fast Smear Negative  Final    Comment: (NOTE) Performed At: [facility] [place], [place] [id number] Rush Farmer MD NO:[id number]    Source (AFB) PLEURAL  Final    Comment: Performed at [facility], [address] [place], [place] [address]  Body fluid culture w Gram Stain     Status: None   Collection Time: 03/01/21  4:45 PM   Specimen: PATH Cytology Pleural fluid  Result Value Ref Range Status   Specimen Description   Final    PLEURAL Performed at [facility], [address], [place], [place] [address]    Special Requests   Final    NONE Performed at [facility], [address] [place], [place] [address]    Gram Stain   Final    FEW WBC PRESENT, PREDOMINANTLY MONONUCLEAR NO ORGANISMS SEEN    Culture   Final    NO GROWTH 3 DAYS Performed at [facility], [place] [address] [place], [place] [address]    Report Status 03/05/2021 FINAL  Final  Resp Panel by RT-PCR (Flu A&B, Covid) Nasopharyngeal Swab     Status: None   Collection Time: 03/06/21  6:46 PM   Specimen: Nasopharyngeal Swab; Nasopharyngeal(NP) swabs in vial transport medium  Result Value Ref Range Status   SARS Coronavirus 2 by RT PCR NEGATIVE NEGATIVE Final    Comment: (NOTE) SARS-CoV-2 target nucleic acids are NOT DETECTED.  The SARS-CoV-2 RNA is generally detectable in upper respiratory specimens during the acute phase of infection. The lowest concentration of SARS-CoV-2  viral copies this assay can detect is 138 copies/mL. A negative result does not preclude SARS-Cov-2 infection and should not be used as the sole basis for treatment or other patient management decisions. A negative result may occur with  improper specimen collection/handling, submission of specimen other than nasopharyngeal swab, presence of viral mutation(s) within the areas targeted by this assay, and inadequate number of viral copies(<138 copies/mL). A negative result must be combined with clinical observations, patient history, and epidemiological information. The expected result is Negative.  Fact Sheet for Patients:  EntrepreneurPulse.com.au  Fact Sheet for Healthcare Providers:  IncredibleEmployment.be  This test is no t yet approved or cleared by the [place] FDA and  has been authorized for detection and/or diagnosis of SARS-CoV-2 by FDA under an Emergency Use Authorization (EUA). This EUA will remain  in effect (meaning this test can be used) for the duration of the COVID-19 declaration under Section 564(b)(1) of the Act, 21 U.S.C.section 360bbb-3(b)(1), unless the authorization is terminated  or revoked sooner.       Influenza A by PCR NEGATIVE NEGATIVE Final   Influenza B by PCR NEGATIVE NEGATIVE Final    Comment: (NOTE) The Xpert Xpress SARS-CoV-2/FLU/RSV plus assay is intended as an aid in the diagnosis of influenza from Nasopharyngeal swab specimens and should not be used as a sole basis for treatment. Nasal washings and aspirates are unacceptable for Xpert Xpress SARS-CoV-2/FLU/RSV testing.  Fact Sheet for Patients: EntrepreneurPulse.com.au  Fact Sheet for Healthcare Providers:  IncredibleEmployment.be  This test is not yet approved or cleared by the [place] and has been authorized for detection and/or diagnosis of SARS-CoV-2 by FDA under an Emergency Use Authorization  (EUA). This EUA will remain in effect (meaning this test can be used) for the duration of the COVID-19 declaration under Section 564(b)(1) of the Act, 21 U.S.C. section 360bbb-3(b)(1), unless the authorization is terminated or revoked.  Performed at [facility], [address] [place], [place] [address]      Total time spend on discharging this patient, including the last patient exam, discussing the hospital stay, instructions for ongoing care as it relates to all pertinent caregivers, as well as preparing the medical discharge records, prescriptions, and/or referrals as applicable, is 45 minutes.    Enzo Bi, MD  [facility] 03/08/2021, 10:43 AM

## 2021-03-08 NOTE — Plan of Care (Signed)
  Problem: Education: Goal: Knowledge of General Education information will improve Description: Including pain rating scale, medication(s)/side effects and non-pharmacologic comfort measures 03/08/2021 1044 by Donzetta Matters, RN Outcome: Adequate for Discharge 03/08/2021 1044 by Donzetta Matters, RN Outcome: Progressing   Problem: Health Behavior/Discharge Planning: Goal: Ability to manage health-related needs will improve 03/08/2021 1044 by Donzetta Matters, RN Outcome: Adequate for Discharge 03/08/2021 1044 by Donzetta Matters, RN Outcome: Progressing   Problem: Clinical Measurements: Goal: Ability to maintain clinical measurements within normal limits will improve 03/08/2021 1044 by Donzetta Matters, RN Outcome: Adequate for Discharge 03/08/2021 1044 by Donzetta Matters, RN Outcome: Progressing Goal: Will remain free from infection 03/08/2021 1044 by Donzetta Matters, RN Outcome: Adequate for Discharge 03/08/2021 1044 by Donzetta Matters, RN Outcome: Progressing Goal: Diagnostic test results will improve 03/08/2021 1044 by Donzetta Matters, RN Outcome: Adequate for Discharge 03/08/2021 1044 by Donzetta Matters, RN Outcome: Progressing Goal: Respiratory complications will improve 03/08/2021 1044 by Donzetta Matters, RN Outcome: Adequate for Discharge 03/08/2021 1044 by Donzetta Matters, RN Outcome: Progressing Goal: Cardiovascular complication will be avoided 03/08/2021 1044 by Donzetta Matters, RN Outcome: Adequate for Discharge 03/08/2021 1044 by Donzetta Matters, RN Outcome: Progressing   Problem: Activity: Goal: Risk for activity intolerance will decrease 03/08/2021 1044 by Donzetta Matters, RN Outcome: Adequate for Discharge 03/08/2021 1044 by Donzetta Matters, RN Outcome: Progressing   Problem: Nutrition: Goal: Adequate nutrition will be maintained 03/08/2021 1044 by Donzetta Matters, RN Outcome: Adequate for Discharge 03/08/2021 1044 by Donzetta Matters, RN Outcome: Progressing   Problem: Coping: Goal: Level of anxiety will  decrease 03/08/2021 1044 by Donzetta Matters, RN Outcome: Adequate for Discharge 03/08/2021 1044 by Donzetta Matters, RN Outcome: Progressing   Problem: Elimination: Goal: Will not experience complications related to bowel motility 03/08/2021 1044 by Donzetta Matters, RN Outcome: Adequate for Discharge 03/08/2021 1044 by Donzetta Matters, RN Outcome: Progressing Goal: Will not experience complications related to urinary retention 03/08/2021 1044 by Donzetta Matters, RN Outcome: Adequate for Discharge 03/08/2021 1044 by Donzetta Matters, RN Outcome: Progressing   Problem: Pain Managment: Goal: General experience of comfort will improve 03/08/2021 1044 by Donzetta Matters, RN Outcome: Adequate for Discharge 03/08/2021 1044 by Donzetta Matters, RN Outcome: Progressing   Problem: Safety: Goal: Ability to remain free from injury will improve 03/08/2021 1044 by Donzetta Matters, RN Outcome: Adequate for Discharge 03/08/2021 1044 by Donzetta Matters, RN Outcome: Progressing   Problem: Skin Integrity: Goal: Risk for impaired skin integrity will decrease 03/08/2021 1044 by Donzetta Matters, RN Outcome: Adequate for Discharge 03/08/2021 1044 by Donzetta Matters, RN Outcome: Progressing   Problem: Education: Goal: Ability to demonstrate management of disease process will improve 03/08/2021 1044 by Donzetta Matters, RN Outcome: Adequate for Discharge 03/08/2021 1044 by Donzetta Matters, RN Outcome: Progressing Goal: Ability to verbalize understanding of medication therapies will improve 03/08/2021 1044 by Donzetta Matters, RN Outcome: Adequate for Discharge 03/08/2021 1044 by Donzetta Matters, RN Outcome: Progressing Goal: Individualized Educational Video(s) 03/08/2021 1044 by Donzetta Matters, RN Outcome: Adequate for Discharge 03/08/2021 1044 by Donzetta Matters, RN Outcome: Progressing   Problem: Activity: Goal: Capacity to carry out activities will improve 03/08/2021 1044 by Donzetta Matters, RN Outcome: Adequate for Discharge 03/08/2021 1044 by Donzetta Matters,  RN Outcome: Progressing   Problem: Cardiac: Goal: Ability to achieve and maintain adequate cardiopulmonary perfusion will improve 03/08/2021 1044 by Donzetta Matters, RN Outcome: Adequate for Discharge 03/08/2021 1044 by Donzetta Matters, RN Outcome: Progressing

## 2021-03-13 ENCOUNTER — Inpatient Hospital Stay: Payer: Medicare Other

## 2021-03-13 ENCOUNTER — Emergency Department: Payer: Medicare Other

## 2021-03-13 ENCOUNTER — Other Ambulatory Visit: Payer: Self-pay

## 2021-03-13 ENCOUNTER — Encounter: Payer: Self-pay | Admitting: Internal Medicine

## 2021-03-13 ENCOUNTER — Inpatient Hospital Stay
Admission: EM | Admit: 2021-03-13 | Discharge: 2021-03-15 | DRG: 871 | Disposition: A | Discharge status: Home / Self Care | Payer: Medicare Other | Attending: Internal Medicine | Admitting: Internal Medicine

## 2021-03-13 DIAGNOSIS — D693 Immune thrombocytopenic purpura: Secondary | ICD-10-CM | POA: Diagnosis present

## 2021-03-13 DIAGNOSIS — I350 Nonrheumatic aortic (valve) stenosis: Secondary | ICD-10-CM | POA: Diagnosis present

## 2021-03-13 DIAGNOSIS — N179 Acute kidney failure, unspecified: Secondary | ICD-10-CM

## 2021-03-13 DIAGNOSIS — Z801 Family history of malignant neoplasm of trachea, bronchus and lung: Secondary | ICD-10-CM | POA: Diagnosis not present

## 2021-03-13 DIAGNOSIS — J9621 Acute and chronic respiratory failure with hypoxia: Secondary | ICD-10-CM | POA: Diagnosis present

## 2021-03-13 DIAGNOSIS — I712 Thoracic aortic aneurysm, without rupture: Secondary | ICD-10-CM | POA: Diagnosis present

## 2021-03-13 DIAGNOSIS — I2721 Secondary pulmonary arterial hypertension: Secondary | ICD-10-CM | POA: Diagnosis not present

## 2021-03-13 DIAGNOSIS — J9620 Acute and chronic respiratory failure, unspecified whether with hypoxia or hypercapnia: Secondary | ICD-10-CM | POA: Diagnosis present

## 2021-03-13 DIAGNOSIS — A419 Sepsis, unspecified organism: Secondary | ICD-10-CM | POA: Diagnosis not present

## 2021-03-13 DIAGNOSIS — N1831 Chronic kidney disease, stage 3a: Secondary | ICD-10-CM | POA: Diagnosis present

## 2021-03-13 DIAGNOSIS — D472 Monoclonal gammopathy: Secondary | ICD-10-CM | POA: Diagnosis present

## 2021-03-13 DIAGNOSIS — Z8701 Personal history of pneumonia (recurrent): Secondary | ICD-10-CM | POA: Diagnosis not present

## 2021-03-13 DIAGNOSIS — E43 Unspecified severe protein-calorie malnutrition: Secondary | ICD-10-CM | POA: Diagnosis present

## 2021-03-13 DIAGNOSIS — Z888 Allergy status to other drugs, medicaments and biological substances status: Secondary | ICD-10-CM

## 2021-03-13 DIAGNOSIS — J181 Lobar pneumonia, unspecified organism: Secondary | ICD-10-CM | POA: Diagnosis present

## 2021-03-13 DIAGNOSIS — D509 Iron deficiency anemia, unspecified: Secondary | ICD-10-CM | POA: Diagnosis present

## 2021-03-13 DIAGNOSIS — I2510 Atherosclerotic heart disease of native coronary artery without angina pectoris: Secondary | ICD-10-CM | POA: Diagnosis present

## 2021-03-13 DIAGNOSIS — G4733 Obstructive sleep apnea (adult) (pediatric): Secondary | ICD-10-CM | POA: Diagnosis present

## 2021-03-13 DIAGNOSIS — I482 Chronic atrial fibrillation, unspecified: Secondary | ICD-10-CM | POA: Diagnosis present

## 2021-03-13 DIAGNOSIS — Z7901 Long term (current) use of anticoagulants: Secondary | ICD-10-CM

## 2021-03-13 DIAGNOSIS — D631 Anemia in chronic kidney disease: Secondary | ICD-10-CM | POA: Diagnosis present

## 2021-03-13 DIAGNOSIS — I5032 Chronic diastolic (congestive) heart failure: Secondary | ICD-10-CM | POA: Diagnosis present

## 2021-03-13 DIAGNOSIS — R0602 Shortness of breath: Secondary | ICD-10-CM

## 2021-03-13 DIAGNOSIS — N1832 Chronic kidney disease, stage 3b: Secondary | ICD-10-CM

## 2021-03-13 DIAGNOSIS — I959 Hypotension, unspecified: Secondary | ICD-10-CM | POA: Diagnosis not present

## 2021-03-13 DIAGNOSIS — I130 Hypertensive heart and chronic kidney disease with heart failure and stage 1 through stage 4 chronic kidney disease, or unspecified chronic kidney disease: Secondary | ICD-10-CM | POA: Diagnosis present

## 2021-03-13 DIAGNOSIS — R1312 Dysphagia, oropharyngeal phase: Secondary | ICD-10-CM | POA: Diagnosis present

## 2021-03-13 DIAGNOSIS — I5043 Acute on chronic combined systolic (congestive) and diastolic (congestive) heart failure: Secondary | ICD-10-CM | POA: Diagnosis present

## 2021-03-13 DIAGNOSIS — J432 Centrilobular emphysema: Secondary | ICD-10-CM | POA: Diagnosis present

## 2021-03-13 DIAGNOSIS — Z20822 Contact with and (suspected) exposure to covid-19: Secondary | ICD-10-CM | POA: Diagnosis present

## 2021-03-13 DIAGNOSIS — Z79899 Other long term (current) drug therapy: Secondary | ICD-10-CM

## 2021-03-13 DIAGNOSIS — Z87891 Personal history of nicotine dependence: Secondary | ICD-10-CM | POA: Diagnosis not present

## 2021-03-13 DIAGNOSIS — J189 Pneumonia, unspecified organism: Secondary | ICD-10-CM | POA: Diagnosis not present

## 2021-03-13 DIAGNOSIS — I493 Ventricular premature depolarization: Secondary | ICD-10-CM | POA: Diagnosis present

## 2021-03-13 DIAGNOSIS — Z7189 Other specified counseling: Secondary | ICD-10-CM | POA: Diagnosis not present

## 2021-03-13 DIAGNOSIS — L8990 Pressure ulcer of unspecified site, unspecified stage: Secondary | ICD-10-CM | POA: Insufficient documentation

## 2021-03-13 DIAGNOSIS — I509 Heart failure, unspecified: Secondary | ICD-10-CM | POA: Insufficient documentation

## 2021-03-13 DIAGNOSIS — J9610 Chronic respiratory failure, unspecified whether with hypoxia or hypercapnia: Secondary | ICD-10-CM | POA: Diagnosis present

## 2021-03-13 DIAGNOSIS — I10 Essential (primary) hypertension: Secondary | ICD-10-CM | POA: Diagnosis present

## 2021-03-13 DIAGNOSIS — L89811 Pressure ulcer of head, stage 1: Secondary | ICD-10-CM | POA: Diagnosis present

## 2021-03-13 DIAGNOSIS — N183 Chronic kidney disease, stage 3 unspecified: Secondary | ICD-10-CM | POA: Diagnosis present

## 2021-03-13 LAB — PROTIME-INR
INR: 2.3 — ABNORMAL HIGH (ref 0.8–1.2)
Prothrombin Time: 25.5 seconds — ABNORMAL HIGH (ref 11.4–15.2)

## 2021-03-13 LAB — CBC WITH DIFFERENTIAL/PLATELET
Abs Immature Granulocytes: 0.11 10*3/uL — ABNORMAL HIGH (ref 0.00–0.07)
Basophils Absolute: 0.1 10*3/uL (ref 0.0–0.1)
Basophils Relative: 0 %
Eosinophils Absolute: 0.3 10*3/uL (ref 0.0–0.5)
Eosinophils Relative: 2 %
HCT: 24 % — ABNORMAL LOW (ref 39.0–52.0)
Hemoglobin: 8 g/dL — ABNORMAL LOW (ref 13.0–17.0)
Immature Granulocytes: 1 %
Lymphocytes Relative: 5 %
Lymphs Abs: 0.6 10*3/uL — ABNORMAL LOW (ref 0.7–4.0)
MCH: 32.4 pg (ref 26.0–34.0)
MCHC: 33.3 g/dL (ref 30.0–36.0)
MCV: 97.2 fL (ref 80.0–100.0)
Monocytes Absolute: 2.1 10*3/uL — ABNORMAL HIGH (ref 0.1–1.0)
Monocytes Relative: 16 %
Neutro Abs: 10.3 10*3/uL — ABNORMAL HIGH (ref 1.7–7.7)
Neutrophils Relative %: 76 %
Platelets: 361 10*3/uL (ref 150–400)
RBC: 2.47 MIL/uL — ABNORMAL LOW (ref 4.22–5.81)
RDW: 17.7 % — ABNORMAL HIGH (ref 11.5–15.5)
WBC: 13.4 10*3/uL — ABNORMAL HIGH (ref 4.0–10.5)
nRBC: 0 % (ref 0.0–0.2)

## 2021-03-13 LAB — COMPREHENSIVE METABOLIC PANEL
ALT: 26 U/L (ref 0–44)
AST: 41 U/L (ref 15–41)
Albumin: 3.3 g/dL — ABNORMAL LOW (ref 3.5–5.0)
Alkaline Phosphatase: 108 U/L (ref 38–126)
Anion gap: 9 (ref 5–15)
BUN: 41 mg/dL — ABNORMAL HIGH (ref 8–23)
CO2: 29 mmol/L (ref 22–32)
Calcium: 9 mg/dL (ref 8.9–10.3)
Chloride: 98 mmol/L (ref 98–111)
Creatinine, Ser: 1.6 mg/dL — ABNORMAL HIGH (ref 0.61–1.24)
GFR, Estimated: 43 mL/min — ABNORMAL LOW (ref >60)
Glucose, Bld: 110 mg/dL — ABNORMAL HIGH (ref 70–99)
Potassium: 5.1 mmol/L (ref 3.5–5.1)
Sodium: 136 mmol/L (ref 135–145)
Total Bilirubin: 2 mg/dL — ABNORMAL HIGH (ref 0.3–1.2)
Total Protein: 8.4 g/dL — ABNORMAL HIGH (ref 6.5–8.1)

## 2021-03-13 LAB — PROCALCITONIN: Procalcitonin: 0.23 ng/mL

## 2021-03-13 LAB — BLOOD GAS, ARTERIAL
Acid-Base Excess: 2.5 mmol/L — ABNORMAL HIGH (ref 0.0–2.0)
Bicarbonate: 25.5 mmol/L (ref 20.0–28.0)
FIO2: 0.45
O2 Saturation: 93.9 %
Patient temperature: 37
pCO2 arterial: 32 mmHg (ref 32.0–48.0)
pH, Arterial: 7.51 — ABNORMAL HIGH (ref 7.350–7.450)
pO2, Arterial: 63 mmHg — ABNORMAL LOW (ref 83.0–108.0)

## 2021-03-13 LAB — DIGOXIN LEVEL: Digoxin Level: 1.1 ng/mL (ref 0.8–2.0)

## 2021-03-13 LAB — TROPONIN I (HIGH SENSITIVITY): Troponin I (High Sensitivity): 21 ng/L — ABNORMAL HIGH (ref <18)

## 2021-03-13 LAB — MRSA NEXT GEN BY PCR, NASAL: MRSA by PCR Next Gen: NOT DETECTED

## 2021-03-13 LAB — BLOOD GAS, VENOUS
Acid-Base Excess: 7.7 mmol/L — ABNORMAL HIGH (ref 0.0–2.0)
Bicarbonate: 32.7 mmol/L — ABNORMAL HIGH (ref 20.0–28.0)
O2 Saturation: 52.6 %
Patient temperature: 37
pCO2, Ven: 46 mmHg (ref 44.0–60.0)
pH, Ven: 7.46 — ABNORMAL HIGH (ref 7.250–7.430)
pO2, Ven: <31 mmHg — CL (ref 32.0–45.0)

## 2021-03-13 LAB — LACTIC ACID, PLASMA
Lactic Acid, Venous: 1.4 mmol/L (ref 0.5–1.9)
Lactic Acid, Venous: 1.1 mmol/L (ref 0.5–1.9)

## 2021-03-13 LAB — RESP PANEL BY RT-PCR (FLU A&B, COVID) ARPGX2
Influenza A by PCR: NEGATIVE
Influenza B by PCR: NEGATIVE
SARS Coronavirus 2 by RT PCR: NEGATIVE

## 2021-03-13 LAB — BRAIN NATRIURETIC PEPTIDE: B Natriuretic Peptide: 1158.2 pg/mL — ABNORMAL HIGH (ref 0.0–100.0)

## 2021-03-13 LAB — HIV ANTIBODY (ROUTINE TESTING W REFLEX): HIV Screen 4th Generation wRfx: NONREACTIVE

## 2021-03-13 MED ORDER — SODIUM CHLORIDE 0.9 % IV SOLN: INTRAVENOUS | Status: AC

## 2021-03-13 MED ORDER — DIGOXIN 125 MCG PO TABS
0.1250 mg | ORAL_TABLET | Freq: Every day | ORAL | Status: DC
  Administered 2021-03-13 – 2021-03-15 (×2): 0.125 mg via ORAL
  Filled 2021-03-13 (×3): qty 1

## 2021-03-13 MED ORDER — FUROSEMIDE 10 MG/ML IJ SOLN: 40.0000 mg | Freq: Once | INTRAMUSCULAR | Status: DC

## 2021-03-13 MED ORDER — WARFARIN - PHARMACIST DOSING INPATIENT: Freq: Every day | Status: DC

## 2021-03-13 MED ORDER — ORAL CARE MOUTH RINSE
15.0000 mL | Freq: Two times a day (BID) | OROMUCOSAL | Status: DC
  Administered 2021-03-14 – 2021-03-15 (×3): 15 mL via OROMUCOSAL

## 2021-03-13 MED ORDER — FUROSEMIDE 20 MG PO TABS: 40.0000 mg | ORAL_TABLET | Freq: Every day | ORAL | Status: DC

## 2021-03-13 MED ORDER — VANCOMYCIN HCL IN DEXTROSE 1-5 GM/200ML-% IV SOLN
1000.0000 mg | Freq: Once | INTRAVENOUS | Status: AC
  Administered 2021-03-13: 1000 mg via INTRAVENOUS
  Filled 2021-03-13: qty 200

## 2021-03-13 MED ORDER — METOPROLOL TARTRATE 5 MG/5ML IV SOLN
INTRAVENOUS | Status: AC
  Administered 2021-03-13: 5 mg
  Filled 2021-03-13: qty 5

## 2021-03-13 MED ORDER — LORAZEPAM 2 MG/ML IJ SOLN: 1.0000 mg | Freq: Once | INTRAMUSCULAR | Status: AC

## 2021-03-13 MED ORDER — SODIUM CHLORIDE 0.9 % IV SOLN
3.0000 g | Freq: Three times a day (TID) | INTRAVENOUS | Status: DC
  Administered 2021-03-13 – 2021-03-15 (×7): 3 g via INTRAVENOUS
  Filled 2021-03-13 (×2): qty 8
  Filled 2021-03-13 (×2): qty 3
  Filled 2021-03-13: qty 8
  Filled 2021-03-13: qty 3
  Filled 2021-03-13 (×2): qty 8
  Filled 2021-03-13 (×2): qty 3
  Filled 2021-03-13 (×2): qty 8

## 2021-03-13 MED ORDER — METOPROLOL TARTRATE 5 MG/5ML IV SOLN: 2.5000 mg | Freq: Once | INTRAVENOUS | Status: DC

## 2021-03-13 MED ORDER — METOPROLOL TARTRATE 25 MG PO TABS
25.0000 mg | ORAL_TABLET | Freq: Two times a day (BID) | ORAL | Status: DC
  Administered 2021-03-13: 25 mg via ORAL
  Filled 2021-03-13 (×2): qty 1

## 2021-03-13 MED ORDER — LEVALBUTEROL HCL 0.63 MG/3ML IN NEBU: 0.6300 mg | INHALATION_SOLUTION | Freq: Three times a day (TID) | RESPIRATORY_TRACT | Status: DC | PRN

## 2021-03-13 MED ORDER — CHLORHEXIDINE GLUCONATE CLOTH 2 % EX PADS
6.0000 | MEDICATED_PAD | Freq: Every day | CUTANEOUS | Status: DC
  Administered 2021-03-13 – 2021-03-14 (×2): 6 via TOPICAL

## 2021-03-13 MED ORDER — WARFARIN SODIUM 2 MG PO TABS
2.0000 mg | ORAL_TABLET | Freq: Every day | ORAL | Status: DC
  Filled 2021-03-13 (×2): qty 1

## 2021-03-13 MED ORDER — SODIUM CHLORIDE 0.9 % IV SOLN
1.0000 g | Freq: Once | INTRAVENOUS | Status: AC
  Administered 2021-03-13: 1 g via INTRAVENOUS
  Filled 2021-03-13: qty 1

## 2021-03-13 MED ORDER — ACETAMINOPHEN 500 MG PO TABS
1000.0000 mg | ORAL_TABLET | Freq: Once | ORAL | Status: AC
  Administered 2021-03-13: 1000 mg via ORAL
  Filled 2021-03-13: qty 2

## 2021-03-13 MED ORDER — LORAZEPAM 2 MG/ML IJ SOLN: 1.0000 mg | Freq: Four times a day (QID) | INTRAMUSCULAR | Status: DC | PRN

## 2021-03-13 MED ORDER — WARFARIN SODIUM 2.5 MG PO TABS
2.5000 mg | ORAL_TABLET | ORAL | Status: DC
  Filled 2021-03-13: qty 1

## 2021-03-13 MED ORDER — WARFARIN SODIUM 2 MG PO TABS: 2.0000 mg | ORAL_TABLET | Freq: Every day | ORAL | Status: DC

## 2021-03-13 MED ORDER — WARFARIN SODIUM 2.5 MG PO TABS: 2.5000 mg | ORAL_TABLET | ORAL | Status: DC

## 2021-03-13 MED ORDER — LORAZEPAM 2 MG/ML IJ SOLN
INTRAMUSCULAR | Status: AC
  Administered 2021-03-13: 1 mg via INTRAVENOUS
  Filled 2021-03-13: qty 1

## 2021-03-13 MED ORDER — ALPRAZOLAM 0.5 MG PO TABS: 0.5000 mg | ORAL_TABLET | Freq: Two times a day (BID) | ORAL | Status: DC | PRN

## 2021-03-13 NOTE — H&P (Addendum)
Do not do that just just manages some with being overbearing History and Physical    Samuel Mcknight XHB:[id number] DOB: 05-03-1941 DOA: 03/13/2021  PCP: Kirk Ruths, MD   Patient coming from: Home  I have personally briefly reviewed patient's old medical records in [place]  Chief Complaint: Shortness of breath  HPI: Samuel Mcknight is a 80 y.o. 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  He states that his symptoms are usually worse at night and he is unable to lay flat in bed.  He is having to sleep sitting upright.  He denies having any cough but states that sometimes when he drinks water he starts coughing.  He denies having any fever or chills but had a low-grade temp upon arrival to the ER. He denies having any chest pain, no nausea, no vomiting, no abdominal pain, no dizziness, no lightheadedness, no headache, no blurred vision, no changes in his bowel habits, no lower extremity swelling, no focal deficits. Labs show sodium 136, potassium 5.1, chloride 98, bicarb 29, glucose 110, BUN 41, creatinine 1.6, calcium 9.0, alkaline phosphatase 108, albumin 3.3, AST 41, ALT 26, total protein 8.4, total bilirubin 2.0, BNP 1158, troponin 21, lactic acid 1.4, procalcitonin 0.23, white count 13.4, hemoglobin 8.0, hematocrit 24.0, MCV 97.2, RDW 17.7, platelet count 361. Respiratory viral panel is negative Chest x-ray reviewed by me shows cardiomegaly with slight worsening of diffuse interstitial and patchy airspace opacities most likely pulmonary edema.  Slightly increased mild to moderate right pleural effusion. 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 Recommend continued follow-up as  noted on previous exams. Small right pleural effusion, decreasing since prior study. Patchy airspace disease throughout the right lung most confluent in the right lower lobe concerning for pneumonia. Two adjacent right upper lobe nodules. These could be followed with repeat CT in 6 months. Cardiomegaly, coronary artery disease. Aortic Atherosclerosis  and Emphysema  Twelve-lead EKG by me shows A. fib with PVCs.   ED Course: Patient is an 80 year old Caucasian male who was recently discharged from the hospital and presents to the emergency room today for evaluation of worsening shortness of breath mostly with exertion. Following his last hospitalization he was discharged with a CPAP which he has been using because according to him it is uncomfortable. Shortness of breath is associated with a sore throat and fever with a T-max of 100.9. CT scan chest without contrast shows right lower lobe pneumonia. Patient received IV vancomycin and cefepime in the ER. Patient will be admitted to the hospital for further evaluation.    Review of Systems: As per HPI otherwise all other systems reviewed and negative.    Past Medical History:  Diagnosis Date   Atrial fibrillation ([place])    GAD (generalized anxiety disorder)    Hypertension    Prediabetes     Past Surgical History:  Procedure Laterality Date   CARDIOVERSION N/A 02/24/2020   Procedure: CARDIOVERSION;  Surgeon: Isaias Cowman, MD;  Location: [facility] ORS;  Service: Cardiovascular;  Laterality: N/A;   supraorbital artery repair  2005   TOOTH EXTRACTION       reports that he has quit smoking. His smoking use included cigarettes. He quit smokeless tobacco use about 16 years  ago. He reports previous alcohol use. He reports previous drug use.  Allergies  Allergen Reactions   Bupropion Swelling   Amiodarone Rash    Broke out in dark blue blotches   Diltiazem Rash    Family History  Problem Relation Age of Onset   Lung cancer  Father       Prior to Admission medications   Medication Sig Start Date End Date Taking? Authorizing Provider  ALPRAZolam Duanne Moron) 0.5 MG tablet Take 0.5 mg by mouth 2 (two) times daily as needed for anxiety.  02/11/19   [provider]  digoxin (LANOXIN) 0.125 MG tablet Take 0.125 mg by mouth daily. 03/09/20 03/09/21  [provider]  eltrombopag (PROMACTA) 25 MG tablet Take 1 tablet (25 mg total) by mouth daily. Take on an empty stomach 1 hour before a meal or 2 hours after 10/10/20   Cammie Sickle, MD  furosemide (LASIX) 40 MG tablet Take 1 tablet (40 mg total) by mouth daily. Increase from 20 mg daily. 03/08/21 06/06/21  Enzo Bi, MD  metoprolol tartrate (LOPRESSOR) 25 MG tablet Take 1 tablet (25 mg total) by mouth 2 (two) times daily. 03/05/21 04/04/21  Wyvonnia Dusky, MD  warfarin (COUMADIN) 2 MG tablet Take 2 mg by mouth daily. 08/07/20   [provider]  warfarin (COUMADIN) 2.5 MG tablet Take 2.5 mg by mouth every other day. 02/07/21   [provider]    Physical Exam: Vitals:   03/13/21 0609 03/13/21 0700 03/13/21 0808 03/13/21 0850  BP: (!) 164/55 (!) 90/56 99/78 102/64  Pulse: 88 90 92 94  Resp: 16 20 16 20   Temp:   98.5 F (36.9 C) 98 F (36.7 C)  TempSrc:   Oral   SpO2: 98% 99% 100% 99%  Weight:      Height:         Vitals:   03/13/21 0609 03/13/21 0700 03/13/21 0808 03/13/21 0850  BP: (!) 164/55 (!) 90/56 99/78 102/64  Pulse: 88 90 92 94  Resp: 16 20 16 20   Temp:   98.5 F (36.9 C) 98 F (36.7 C)  TempSrc:   Oral   SpO2: 98% 99% 100% 99%  Weight:      Height:          Constitutional: Alert and oriented x 3 . Not in any apparent distress. Chronically  Ill-appearing. HEENT:      Head: Normocephalic and atraumatic.         Eyes: PERLA, EOMI, Conjunctivae are normal. Sclera is non-icteric.       Mouth/Throat: Mucous membranes are moist.       Neck: Supple with no signs of meningismus. Cardiovascular: Irregularly irregular.  Systolic ejection murmur, gallops, or rubs. 2+ symmetrical distal pulses are present . No JVD. No LE edema Respiratory: Respiratory effort normal.  Bilateral air entry.  Rhonchi right base.  Faint crackles at the bases.no wheezes Gastrointestinal: Soft, non tender, and non distended with positive bowel sounds.  Genitourinary: No CVA tenderness. Musculoskeletal: Nontender with normal range of motion in all extremities. No cyanosis, or erythema of extremities. Neurologic:  Face is symmetric. Moving all extremities. No gross focal neurologic deficits . Skin: Skin is warm, dry.  No rash or ulcers Psychiatric: Mood and affect are normal    Labs on Admission: I have personally reviewed following labs and imaging studies  CBC: Recent Labs  Lab 03/06/21 1422 03/07/21 0413 03/08/21 0518 03/13/21 0527  WBC 11.4* 11.3* 12.2* 13.4*  NEUTROABS  --   --   --  10.3*  HGB 8.5* 8.7* 8.1* 8.0*  HCT 25.6* 26.5* 24.6* 24.0*  MCV 98.8 98.1 95.7 97.2  PLT 272 268 284 573   Basic Metabolic Panel: Recent Labs  Lab 03/06/21 1422 03/07/21 0413 03/08/21 0518 03/13/21 0527  NA 134* 136 135 136  K 4.7 4.2 4.1 5.1  CL 102 100 99 98  CO2 26 31 31 29   GLUCOSE 146* 88 88 110*  BUN 33* 31* 36* 41*  CREATININE 1.41* 1.43* 1.51* 1.60*  CALCIUM 9.0 9.1 8.7* 9.0  MG  --   --  1.8  --    GFR: Estimated Creatinine Clearance: 33.1 mL/min (A) (by C-G formula based on SCr of 1.6 mg/dL (H)). Liver Function Tests: Recent Labs  Lab 03/13/21 0527  AST 41  ALT 26  ALKPHOS 108  BILITOT 2.0*  PROT 8.4*  ALBUMIN 3.3*   No results for input(s): LIPASE, AMYLASE in the last 168 hours. No results for input(s): AMMONIA in the last 168 hours. Coagulation Profile: Recent Labs  Lab 03/06/21 2222 03/07/21 0413 03/08/21 0518 03/13/21 0527  INR 2.7* 2.3* 2.7* 2.3*   Cardiac Enzymes: No results for input(s): CKTOTAL, CKMB, CKMBINDEX, TROPONINI in the last 168 hours. BNP (last 3 results) No results for  input(s): PROBNP in the last 8760 hours. HbA1C: No results for input(s): HGBA1C in the last 72 hours. CBG: No results for input(s): GLUCAP in the last 168 hours. Lipid Profile: No results for input(s): CHOL, HDL, LDLCALC, TRIG, CHOLHDL, LDLDIRECT in the last 72 hours. Thyroid Function Tests: No results for input(s): TSH, T4TOTAL, FREET4, T3FREE, THYROIDAB in the last 72 hours. Anemia Panel: No results for input(s): VITAMINB12, FOLATE, FERRITIN, TIBC, IRON, RETICCTPCT in the last 72 hours. Urine analysis:    Component Value Date/Time   COLORURINE YELLOW (A) 03/06/2021 1849   APPEARANCEUR CLEAR (A) 03/06/2021 1849   LABSPEC 1.012 03/06/2021 1849   PHURINE 5.0 03/06/2021 1849   GLUCOSEU NEGATIVE 03/06/2021 1849   HGBUR NEGATIVE 03/06/2021 1849   BILIRUBINUR NEGATIVE 03/06/2021 1849   [place] 03/06/2021 1849   PROTEINUR 30 (A) 03/06/2021 1849   NITRITE NEGATIVE 03/06/2021 1849   LEUKOCYTESUR NEGATIVE 03/06/2021 1849    Radiological Exams on Admission: CT CHEST WO CONTRAST  Result Date: 03/13/2021 CLINICAL DATA:  Shortness of breath EXAM: CT CHEST WITHOUT CONTRAST TECHNIQUE: Multidetector CT imaging of the chest was performed following the standard protocol without IV contrast. COMPARISON:  02/28/2021 FINDINGS: Cardiovascular: Aneurysmal dilatation of the ascending thoracic aorta again noted measuring up to 4.7 cm, stable. Diffuse aortic atherosclerosis. Cardiomegaly. Diffuse coronary artery calcifications. Mediastinum/Nodes: Stable mildly prominent mediastinal lymph nodes. No axillary or visible hilar adenopathy. Lungs/Pleura: Decreasing right pleural effusion. Residual small right effusion. 2 adjacent nodules in the right upper lobe are again noted measuring 9 mm and 7 mm. Moderate emphysema. Patchy airspace disease throughout the right lung, most confluent in the right lower lobe. Scarring in the lingula and left lower lobe. Upper Abdomen: Imaging into the upper abdomen  demonstrates no acute findings. Musculoskeletal: Chest wall soft tissues are unremarkable. No acute bony abnormality. 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 Recommend continued follow-up as noted on previous exams. Small right pleural effusion, decreasing since prior study. Patchy airspace disease throughout the right lung most confluent in the right lower lobe concerning for pneumonia. Two adjacent right upper lobe nodules. These could be followed with repeat CT in 6 months. Cardiomegaly, coronary artery disease. Aortic Atherosclerosis (ICD10-I70.0) and Emphysema (ICD10-J43.9). Electronically Signed  By: Rolm Baptise M.D.   On: 03/13/2021 08:20   DG Chest Portable 1 View  Result Date: 03/13/2021 CLINICAL DATA:  Shortness of breath. EXAM: PORTABLE CHEST 1 VIEW COMPARISON:  03/06/2021. FINDINGS: Similar enlargement the cardiac silhouette. Calcific atherosclerosis of the aorta. Ascending thoracic aortic aneurysm better characterized on prior CT chest. Slight increase in a small to moderate right pleural effusion. No visible pneumothorax. Mildly increased diffuse interstitial opacities with patchy airspace opacities. IMPRESSION: 1. Similar cardiomegaly with slight worsening of diffuse interstitial and patchy airspace opacities, most likely pulmonary edema/CHF. Infection not excluded. 2. Slightly increased small to moderate right pleural effusion. Electronically Signed   By: Margaretha Sheffield MD   On: 03/13/2021 06:36     Assessment/Plan Principal Problem:   Lobar pneumonia ([place]) Active Problems:   Anemia of chronic kidney failure, stage 3 (moderate)   Atrial fibrillation, chronic (HCC)   Essential hypertension   Chronic respiratory failure (HCC)   Chronic diastolic CHF (congestive heart failure) ([place])   Aortic stenosis      Lobar pneumonia Patient presents for evaluation of worsening shortness of breath from his baseline mostly with exertion associated with a sore throat,  fever and cough when he drinks fluids CT scan of the chest shows a right lower lobe pneumonia concerning for aspiration pneumonia Will place patient on Unasyn adjusted to renal function Consult speech therapy for swallow function evaluation Follow-up results of blood cultures     Chronic diastolic dysfunction CHF Patient presents for evaluation of chronic and progressively worsening shortness of breath Symptoms are worse with exertion and associated with orthopnea Most likely related to severe aortic stenosis TAVR has been discussed with patient who is hesitant about getting the procedure Continue metoprolol, hold dose of Lasix today due to relative hypotension. Patient will benefit from palliative care consult to discuss goals of care     Chronic atrial fibrillation Rate controlled Continue digoxin and metoprolol Patient is on Coumadin as a primary prophylaxis for an acute stroke and has a therapeutic INR    Anxiety disorder Continue alprazolam 0.5 mg p.o. 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  All questions and concerns have been addressed.  He verbalizes understanding and agrees with the plan. Disposition Plan: Back to previous home environment Consults called: none  Status: At the time of admission, it appears that the appropriate admission status for this patient is inpatient. This is judged to be reasonable and necessary in order to provide the required intensity of service to ensure the patient's safety given the presenting symptoms, physical exam findings, and initial radiographic and laboratory data in the  context of their comorbid conditions. Patient requires inpatient status due to high intensity of service, high risk for further deterioration and high frequency of surveillance required.    Collier Bullock MD [facility]     03/13/2021, 8:54 AM

## 2021-03-13 NOTE — Progress Notes (Signed)
Patient has become prgressively more short of breath. His 02 sats continued to drop into the 80's so he was placed on 10 liter venti mask. His 02 sat is up to 92%. Heart rate is fluctuating up into the 120's and bp 136/90. Also complaining of some chest pressure/tightness. Dr Francine Graven notified and came to assess patient. Please see orders. Patient to be transferred to step down unit. Wife at bedside and aware.

## 2021-03-13 NOTE — ED Notes (Signed)
Transportation to the floor placed on this pt.

## 2021-03-13 NOTE — ED Provider Notes (Signed)
[facility] Emergency Department Provider Note  ____________________________________________  Time seen: Approximately 5:46 AM  I have reviewed the triage vital signs and the nursing notes.   HISTORY  Chief Complaint Shortness of Breath (Chronic sob that is progressively getting worse)   HPI Samuel Mcknight is a 80 y.o. 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  Patient has been admitted twice in the last 2 weeks for acute on chronic shortness of breath in the setting of multifocal pneumonia and CHF exacerbation.  Patient was last discharged 5 days ago.  He reports that after 2 days at home he started feeling progressively worsening shortness of breath.  Has been using his oxygen at home.  They did deliver the CPAP machine but patient reports that is very uncomfortable and therefore he is not using it.  This morning he got up to go to the bathroom like he did several times last night but could not catch his breath.  He sat on the bedside and was extremely short of breath which prompted him to call 911.  He denies chest pain, any known fevers, chills, body aches.  Endorses compliance with his medications.  Also complained of a mild sore throat that started this morning.   Past Medical History:  Diagnosis Date   Atrial fibrillation ([place])    GAD (generalized anxiety disorder)    Hypertension    Prediabetes     Patient Active Problem List   Diagnosis Date Noted   Acute CHF (congestive heart failure) ([place]) 03/13/2021   Hypoxia 03/07/2021   Shortness of breath 03/06/2021   MGUS (monoclonal gammopathy of unknown significance) 03/06/2021   Atrial fibrillation, chronic ([place]) 03/06/2021   Essential hypertension 03/06/2021   Acute on chronic respiratory failure with hypoxia ([place]) 03/06/2021   Multifocal pneumonia  02/28/2021   Normocytic anemia 03/15/2019   Anemia of chronic kidney failure, stage 3 (moderate) 03/15/2019    Past Surgical History:  Procedure Laterality Date   CARDIOVERSION N/A 02/24/2020   Procedure: CARDIOVERSION;  Surgeon: Isaias Cowman, MD;  Location: [facility] ORS;  Service: Cardiovascular;  Laterality: N/A;   supraorbital artery repair  2005   TOOTH EXTRACTION      Prior to Admission medications   Medication Sig Start Date End Date Taking? Authorizing Provider  ALPRAZolam Duanne Moron) 0.5 MG tablet Take 0.5 mg by mouth 2 (two) times daily as needed for anxiety.  02/11/19   [provider]  digoxin (LANOXIN) 0.125 MG tablet Take 0.125 mg by mouth daily. 03/09/20 03/09/21  [provider]  eltrombopag (PROMACTA) 25 MG tablet Take 1 tablet (25 mg total) by mouth daily. Take on an empty stomach 1 hour before a meal or 2 hours after 10/10/20   Cammie Sickle, MD  furosemide (LASIX) 40 MG tablet Take 1 tablet (40 mg total) by mouth daily. Increase from 20 mg daily. 03/08/21 06/06/21  Enzo Bi, MD  metoprolol tartrate (LOPRESSOR) 25 MG tablet Take 1 tablet (25 mg total) by mouth 2 (two) times daily. 03/05/21 04/04/21  Wyvonnia Dusky, MD  warfarin (COUMADIN) 2 MG tablet Take 2 mg by mouth daily. 08/07/20   [provider]  warfarin (COUMADIN) 2.5 MG tablet Take 2.5 mg by mouth every other day. 02/07/21   [provider]    Allergies Bupropion, Amiodarone, and Diltiazem  Family History  Problem Relation Age of Onset  Lung cancer Father     Social History Social History   Tobacco Use   Smoking status: Former    Pack years: 0.00    Types: Cigarettes   Smokeless tobacco: Former    Quit date: 2006  Vaping Use   Vaping Use: Former   Quit date: 03/14/2005  Substance Use Topics   Alcohol use: Not Currently    Comment: quit    Drug use: Not Currently    Review of Systems  Constitutional: Negative for fever. Eyes: Negative for visual  changes. ENT: + sore throat. Neck: No neck pain  Cardiovascular: Negative for chest pain. Respiratory: + shortness of breath. Gastrointestinal: Negative for abdominal pain, vomiting or diarrhea. Genitourinary: Negative for dysuria. Musculoskeletal: Negative for back pain. Skin: Negative for rash. Neurological: Negative for headaches, weakness or numbness. Psych: No SI or HI  ____________________________________________   PHYSICAL EXAM:  VITAL SIGNS: ED Triage Vitals  Enc Vitals Group     BP 03/13/21 0514 (!) 113/58     Pulse Rate 03/13/21 0514 99     Resp 03/13/21 0514 (!) 28     Temp 03/13/21 0514 (!) 100.9 F (38.3 C)     Temp Source 03/13/21 0514 Oral     SpO2 03/13/21 0514 96 %     Weight 03/13/21 0517 140 lb (63.5 kg)     Height 03/13/21 0517 5\' 7"  (1.702 m)     Head Circumference --      Peak Flow --      Pain Score 03/13/21 0517 0     Pain Loc --      Pain Edu? --      Excl. in [place]? --     Constitutional: Alert and oriented, chronically ill appearing HEENT:      Head: Normocephalic and atraumatic.         Eyes: Conjunctivae are normal. Sclera is non-icteric.       Mouth/Throat: Mucous membranes are moist.       Neck: Supple with no signs of meningismus. Cardiovascular: Regular rate and rhythm. No murmurs, gallops, or rubs. 2+ symmetrical distal pulses are present in all extremities. No JVD. Respiratory: Tachypneic, increased work of breathing, satting 96% on 4 L, decreased breath sounds on bilateral bases with crackles Gastrointestinal: Soft, non tender, and non distended with positive bowel sounds. No rebound or guarding. Genitourinary: No CVA tenderness. Musculoskeletal:  No edema, cyanosis, or erythema of extremities. Neurologic: Normal speech and language. Face is symmetric. Moving all extremities. No gross focal neurologic deficits are appreciated. Skin: Skin is warm, dry and intact. No rash noted. Psychiatric: Mood and affect are normal. Speech and  behavior are normal.  ____________________________________________   LABS (all labs ordered are listed, but only abnormal results are displayed)  Labs Reviewed  CBC WITH DIFFERENTIAL/PLATELET - Abnormal; Notable for the following components:      Result Value   WBC 13.4 (*)    RBC 2.47 (*)    Hemoglobin 8.0 (*)    HCT 24.0 (*)    RDW 17.7 (*)    Neutro Abs 10.3 (*)    Lymphs Abs 0.6 (*)    Monocytes Absolute 2.1 (*)    Abs Immature Granulocytes 0.11 (*)    All other components within normal limits  COMPREHENSIVE METABOLIC PANEL - Abnormal; Notable for the following components:   Glucose, Bld 110 (*)    BUN 41 (*)    Creatinine, Ser 1.60 (*)    Total Protein 8.4 (*)  Albumin 3.3 (*)    Total Bilirubin 2.0 (*)    GFR, Estimated 43 (*)    All other components within normal limits  PROTIME-INR - Abnormal; Notable for the following components:   Prothrombin Time 25.5 (*)    INR 2.3 (*)    All other components within normal limits  BLOOD GAS, VENOUS - Abnormal; Notable for the following components:   pH, Ven 7.46 (*)    pO2, Ven <31.0 (*)    Bicarbonate 32.7 (*)    Acid-Base Excess 7.7 (*)    All other components within normal limits  BRAIN NATRIURETIC PEPTIDE - Abnormal; Notable for the following components:   B Natriuretic Peptide 1,158.2 (*)    All other components within normal limits  TROPONIN I (HIGH SENSITIVITY) - Abnormal; Notable for the following components:   Troponin I (High Sensitivity) 21 (*)    All other components within normal limits  RESP PANEL BY RT-PCR (FLU A&B, COVID) ARPGX2  CULTURE, BLOOD (ROUTINE X 2)  CULTURE, BLOOD (ROUTINE X 2)  PROCALCITONIN  LACTIC ACID, PLASMA  LACTIC ACID, PLASMA   ____________________________________________  EKG  ED ECG REPORT I, Rudene Re, the attending physician, personally viewed and interpreted this ECG.  A. fib with rate of 115, left axis deviation, no ST elevations, mild ST depressions in lateral  leads.  Unchanged when compared to prior. ____________________________________________  RADIOLOGY  I have personally reviewed the images performed during this visit and I agree with the Radiologist's read.   Interpretation by Radiologist:  DG Chest Portable 1 View  Result Date: 03/13/2021 CLINICAL DATA:  Shortness of breath. EXAM: PORTABLE CHEST 1 VIEW COMPARISON:  03/06/2021. FINDINGS: Similar enlargement the cardiac silhouette. Calcific atherosclerosis of the aorta. Ascending thoracic aortic aneurysm better characterized on prior CT chest. Slight increase in a small to moderate right pleural effusion. No visible pneumothorax. Mildly increased diffuse interstitial opacities with patchy airspace opacities. IMPRESSION: 1. Similar cardiomegaly with slight worsening of diffuse interstitial and patchy airspace opacities, most likely pulmonary edema/CHF. Infection not excluded. 2. Slightly increased small to moderate right pleural effusion. Electronically Signed   By: Margaretha Sheffield MD   On: 03/13/2021 06:36     ____________________________________________   PROCEDURES  Procedure(s) performed:yes .1-3 Lead EKG Interpretation  Date/Time: 03/13/2021 5:51 AM Performed by: Rudene Re, MD Authorized by: Rudene Re, MD     Interpretation: abnormal     ECG rate assessment: tachycardic     Rhythm: atrial fibrillation     Ectopy: none     Conduction: abnormal    Critical Care performed: yes  CRITICAL CARE Performed by: Rudene Re  ?  Total critical care time: 30 min  Critical care time was exclusive of separately billable procedures and treating other patients.  Critical care was necessary to treat or prevent imminent or life-threatening deterioration.  Critical care was time spent personally by me on the following activities: development of treatment plan with patient and/or surrogate as well as nursing, discussions with consultants, evaluation of patient's  response to treatment, examination of patient, obtaining history from patient or surrogate, ordering and performing treatments and interventions, ordering and review of laboratory studies, ordering and review of radiographic studies, pulse oximetry and re-evaluation of patient's condition.  ____________________________________________   INITIAL IMPRESSION / ASSESSMENT AND PLAN / ED COURSE  80 y.o. 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  Patient  with acute on chronic shortness of breath progressively worse over the last 3 days and severe this morning.  Patient with a temp of 100.81F.  Tachypneic with respiratory rate of 28 but satting well 4 L, has crackles bilaterally.  Differential diagnosis includes pneumonia versus COVID versus flu versus PE versus CHF exacerbation versus worsening anemia.  EKG showing A. fib unchanged from baseline.  Will get chest x-ray and basic labs.  _________________________ 7:10 AM on 03/13/2021 ----------------------------------------- Elevated white count, fever, chest x-ray concerning for possible infiltrates.  Lactic is normal so his procalcitonin however due to recent admissions to the hospital will cover with cefepime and Vanco.  Patient also with CHF exacerbation with elevated BNP and edema.  Will give IV Lasix.  COVID and flu negative.  Patient significantly volume overloaded therefore will not give IV fluids for possible early sepsis.     _____________________________________________ Please note:  Patient was evaluated in Emergency Department today for the symptoms described in the history of present illness. Patient was evaluated in the context of the global COVID-19 pandemic, which necessitated consideration that the patient might be at risk for infection with the SARS-CoV-2 virus that causes COVID-19.  Institutional protocols and algorithms that pertain to the evaluation of patients at risk for COVID-19 are in a state of rapid change based on information released by regulatory bodies including the CDC and federal and state organizations. These policies and algorithms were followed during the patient's care in the ED.  Some ED evaluations and interventions may be delayed as a result of limited staffing during the pandemic.   [place] Controlled Substance Database was reviewed by me. ____________________________________________   FINAL CLINICAL IMPRESSION(S) / ED DIAGNOSES   Final diagnoses:  Recurrent pneumonia  Acute on chronic congestive heart failure, unspecified heart failure type ([place])      NEW MEDICATIONS STARTED DURING THIS VISIT:  ED Discharge Orders     None        Note:  This document was prepared using Dragon voice recognition software and may include unintentional dictation errors.    Alfred Levins, [place], MD 03/13/21 [phone]

## 2021-03-13 NOTE — Consult Note (Signed)
CRITICAL CARE PROGRESS NOTE    Name: Samuel Mcknight MRN: [id number] DOB: [birth year]     LOS: 0 Referring physician: Dr. Francine Graven  SUBJECTIVE FINDINGS & SIGNIFICANT EVENTS    Patient description:  Is a 80 year old male with a history of permanent atrial fibrillation, advanced COPD with chronic hypoxemia between 3 to 5 L supplemental oxygen at home.  Has chronic anxiety disorder essential hypertension, recently seen in the hospital for acute on chronic hypoxemic respiratory failure with orthopnea.  On admission his labs showed renal insufficiency with creatinine of 1.6 appears to be acute on chronic mild transaminitis and elevation of BNP over 1100 suggestive of recurrence of his CHF.  He was tested for COVID-19 and is negative.  He has interstitial edema bilaterally on his x-ray.  CT chest was done showing chronic a sending thoracic aortic aneurysm at 4.7 cm and a right pleural effusion of minimal size possible infiltrate on the right base and emphysematous lungs.  Telemetry shows accelerated atrial fibrillation.  Patient admitted to stepdown with TRH ,PCCM consultation placed for respiratory distress with A. fib RVR with increased O2 requirement at 10 L/min supplemental oxygen.  Lines/tubes :   Microbiology/Sepsis markers: Results for orders placed or performed during the hospital encounter of 03/13/21  Blood culture (routine x 2)     Status: None (Preliminary result)   Collection Time: 03/13/21  5:27 AM   Specimen: BLOOD  Result Value Ref Range Status   Specimen Description BLOOD LEFT ARM  Final   Special Requests   Final    BOTTLES DRAWN AEROBIC AND ANAEROBIC Blood Culture adequate volume   Culture   Final    NO GROWTH < 12 HOURS Performed at [facility], [address] [place], [place] [address]     Report Status PENDING  Incomplete  Blood culture (routine x 2)     Status: None (Preliminary result)   Collection Time: 03/13/21  5:27 AM   Specimen: BLOOD  Result Value Ref Range Status   Specimen Description BLOOD LEFT ARM  Final   Special Requests   Final    BOTTLES DRAWN AEROBIC AND ANAEROBIC Blood Culture adequate volume   Culture   Final    NO GROWTH < 12 HOURS Performed at [facility], [address], [place],  [address]    Report Status PENDING  Incomplete  Resp Panel by RT-PCR (Flu A&B, Covid) Nasopharyngeal Swab     Status: None   Collection Time: 03/13/21  5:28 AM   Specimen: Nasopharyngeal Swab; Nasopharyngeal(NP) swabs in vial transport medium  Result Value Ref Range Status   SARS Coronavirus 2 by RT PCR NEGATIVE NEGATIVE Final    Comment: (NOTE) SARS-CoV-2 target nucleic acids are NOT DETECTED.  The SARS-CoV-2 RNA is generally detectable in upper respiratory specimens during the acute phase of infection. The lowest concentration of SARS-CoV-2 viral copies this assay can detect is 138 copies/mL. A negative result does not preclude SARS-Cov-2 infection and should not be used as the sole basis for treatment or other patient management decisions. A negative result may occur with  improper specimen collection/handling, submission of specimen other than nasopharyngeal swab, presence of viral mutation(s) within the areas targeted by this assay, and inadequate number of viral copies(<138 copies/mL). A negative result must be combined with clinical observations, patient history, and epidemiological information. The expected result is Negative.  Fact Sheet for Patients:  EntrepreneurPulse.com.au  Fact Sheet for Healthcare Providers:  IncredibleEmployment.be  This test is no t yet  approved or cleared by the [place] and  has been authorized for detection and/or diagnosis of SARS-CoV-2 by FDA under an Emergency  Use Authorization (EUA). This EUA will remain  in effect (meaning this test can be used) for the duration of the COVID-19 declaration under Section 564(b)(1) of the Act, 21 U.S.C.section 360bbb-3(b)(1), unless the authorization is terminated  or revoked sooner.       Influenza A by PCR NEGATIVE NEGATIVE Final   Influenza B by PCR NEGATIVE NEGATIVE Final    Comment: (NOTE) The Xpert Xpress SARS-CoV-2/FLU/RSV plus assay is intended as an aid in the diagnosis of influenza from Nasopharyngeal swab specimens and should not be used as a sole basis for treatment. Nasal washings and aspirates are unacceptable for Xpert Xpress SARS-CoV-2/FLU/RSV testing.  Fact Sheet for Patients: EntrepreneurPulse.com.au  Fact Sheet for Healthcare Providers: IncredibleEmployment.be  This test is not yet approved or cleared by the [place] FDA and has been authorized for detection and/or diagnosis of SARS-CoV-2 by FDA under an Emergency Use Authorization (EUA). This EUA will remain in effect (meaning this test can be used) for the duration of the COVID-19 declaration under Section 564(b)(1) of the Act, 21 U.S.C. section 360bbb-3(b)(1), unless the authorization is terminated or revoked.  Performed at [facility], [address] [place], [place] [address]     Anti-infectives:  Anti-infectives (From admission, onward)    Start     Dose/Rate Route Frequency Ordered Stop   03/13/21 1030  Ampicillin-Sulbactam (UNASYN) 3 g in sodium chloride 0.9 % 100 mL IVPB        3 g 200 mL/hr over 30 Minutes Intravenous Every 8 hours 03/13/21 0938     03/13/21 0715  ceFEPIme (MAXIPIME) 1 g in sodium chloride 0.9 % 100 mL IVPB        1 g 200 mL/hr over 30 Minutes Intravenous  Once 03/13/21 0706 03/13/21 1126   03/13/21 0715  vancomycin (VANCOCIN) IVPB 1000 mg/200 mL premix        1,000 mg 200 mL/hr over 60 Minutes Intravenous  Once 03/13/21 0706 03/13/21 1433         Consults:      Tests / Events: Transthoracic echo done on 12/2020-severe aortic stenosis with regurgitation, LVEF 55%    PAST MEDICAL HISTORY   Past Medical History:  Diagnosis Date   Atrial fibrillation (HCC)    GAD (generalized anxiety disorder)    Hypertension    Prediabetes      SURGICAL HISTORY   Past Surgical History:  Procedure Laterality Date   CARDIOVERSION N/A 02/24/2020   Procedure: CARDIOVERSION;  Surgeon: Isaias Cowman, MD;  Location: [facility] ORS;  Service: Cardiovascular;  Laterality: N/A;   supraorbital artery repair  2005   TOOTH EXTRACTION       FAMILY HISTORY   Family History  Problem Relation Age of Onset   Lung cancer Father      SOCIAL HISTORY   Social History   Tobacco Use   Smoking status: Former    Pack years: 0.00    Types: Cigarettes   Smokeless tobacco: Former    Quit date: 2006  Vaping Use   Vaping Use: Former   Quit date: 03/14/2005  Substance Use Topics   Alcohol use: Not Currently    Comment: quit    Drug use: Not Currently     MEDICATIONS   Current Medication:  Current Facility-Administered Medications:    0.9 %  sodium chloride infusion, , Intravenous, Continuous,  Collier Bullock, MD, Last Rate: 50 mL/hr at 03/13/21 1710, New Bag at 03/13/21 1710   ALPRAZolam (XANAX) tablet 0.5 mg, 0.5 mg, Oral, BID PRN, Agbata, Tochukwu, MD   Ampicillin-Sulbactam (UNASYN) 3 g in sodium chloride 0.9 % 100 mL IVPB, 3 g, Intravenous, Q8H, Beers, Shanon Brow, RPH, Stopped at 03/13/21 1235   digoxin (LANOXIN) tablet 0.125 mg, 0.125 mg, Oral, Daily, Agbata, Tochukwu, MD, 0.125 mg at 03/13/21 1201   furosemide (LASIX) injection 40 mg, 40 mg, Intravenous, Once, Alfred Levins, [place], MD   Derrill Memo ON 03/14/2021] furosemide (LASIX) tablet 40 mg, 40 mg, Oral, Daily, Agbata, Tochukwu, MD   metoprolol tartrate (LOPRESSOR) 5 MG/5ML injection, , , ,    metoprolol tartrate (LOPRESSOR) tablet 25 mg, 25 mg, Oral, BID, Agbata, Tochukwu, MD, 25  mg at 03/13/21 1200   warfarin (COUMADIN) tablet 2 mg, 2 mg, Oral, q1600 **AND** warfarin (COUMADIN) tablet 2.5 mg, 2.5 mg, Oral, Q48H, Beers, Shanon Brow, RPH   Warfarin - Pharmacist Dosing Inpatient, , Does not apply, q1600, Beers, Shanon Brow, RPH    ALLERGIES   Bupropion, Amiodarone, and Diltiazem    REVIEW OF SYSTEMS     10 point reviews of system conducted and is negative except for shortness of breath and chest discomfort  PHYSICAL EXAMINATION   Vital Signs: Temp:  [97.8 F (36.6 C)-100.9 F (38.3 C)] 98.3 F (36.8 C) (07/12 1724) Pulse Rate:  [88-108] 108 (07/12 1724) Resp:  [16-28] 20 (07/12 0850) BP: (90-164)/(55-78) 131/70 (07/12 1724) SpO2:  [89 %-100 %] 89 % (07/12 1724) Weight:  [59.9 kg-63.5 kg] 59.9 kg (07/12 0850)  GENERAL: Age-appropriate HEAD: Normocephalic, atraumatic.  Well-healed chronic scar over occiput EYES: Pupils equal, round, reactive to light.  No scleral icterus.  MOUTH: Moist mucosal membrane. NECK: Supple. No thyromegaly. No nodules. No JVD.  PULMONARY: Pan inspiratory crepitations throughout all lung zones bilaterally CARDIOVASCULAR: S1 and S2. Regular rate and rhythm. No murmurs, rubs, or gallops.  GASTROINTESTINAL: Soft, nontender, non-distended. No masses. Positive bowel sounds. No hepatosplenomegaly.  MUSCULOSKELETAL: No swelling, clubbing, or 1-2+ pitting pedal edema NEUROLOGIC: Mild distress due to acute illness SKIN:intact,warm,dry   PERTINENT DATA     Infusions:  sodium chloride 50 mL/hr at 03/13/21 1710   ampicillin-sulbactam (UNASYN) IV Stopped (03/13/21 1235)   Scheduled Medications:  digoxin  0.125 mg Oral Daily   furosemide  40 mg Intravenous Once   [START ON 03/14/2021] furosemide  40 mg Oral Daily   metoprolol tartrate       metoprolol tartrate  25 mg Oral BID   warfarin  2 mg Oral q1600   And   warfarin  2.5 mg Oral Q48H   Warfarin - Pharmacist Dosing Inpatient   Does not apply q1600   PRN  Medications: ALPRAZolam Hemodynamic parameters:   Intake/Output: No intake/output data recorded.  Ventilator  Settings:    LAB RESULTS:  Basic Metabolic Panel: Recent Labs  Lab 03/07/21 0413 03/08/21 0518 03/13/21 0527  NA 136 135 136  K 4.2 4.1 5.1  CL 100 99 98  CO2 31 31 29   GLUCOSE 88 88 110*  BUN 31* 36* 41*  CREATININE 1.43* 1.51* 1.60*  CALCIUM 9.1 8.7* 9.0  MG  --  1.8  --    Liver Function Tests: Recent Labs  Lab 03/13/21 0527  AST 41  ALT 26  ALKPHOS 108  BILITOT 2.0*  PROT 8.4*  ALBUMIN 3.3*   No results for input(s): LIPASE, AMYLASE in the last 168 hours. No results for  input(s): AMMONIA in the last 168 hours. CBC: Recent Labs  Lab 03/07/21 0413 03/08/21 0518 03/13/21 0527  WBC 11.3* 12.2* 13.4*  NEUTROABS  --   --  10.3*  HGB 8.7* 8.1* 8.0*  HCT 26.5* 24.6* 24.0*  MCV 98.1 95.7 97.2  PLT 268 284 361   Cardiac Enzymes: No results for input(s): CKTOTAL, CKMB, CKMBINDEX, TROPONINI in the last 168 hours. BNP: Invalid input(s): POCBNP CBG: No results for input(s): GLUCAP in the last 168 hours.     IMAGING RESULTS:  Imaging: CT CHEST WO CONTRAST  Result Date: 03/13/2021 CLINICAL DATA:  Shortness of breath EXAM: CT CHEST WITHOUT CONTRAST TECHNIQUE: Multidetector CT imaging of the chest was performed following the standard protocol without IV contrast. COMPARISON:  02/28/2021 FINDINGS: Cardiovascular: Aneurysmal dilatation of the ascending thoracic aorta again noted measuring up to 4.7 cm, stable. Diffuse aortic atherosclerosis. Cardiomegaly. Diffuse coronary artery calcifications. Mediastinum/Nodes: Stable mildly prominent mediastinal lymph nodes. No axillary or visible hilar adenopathy. Lungs/Pleura: Decreasing right pleural effusion. Residual small right effusion. 2 adjacent nodules in the right upper lobe are again noted measuring 9 mm and 7 mm. Moderate emphysema. Patchy airspace disease throughout the right lung, most confluent in the  right lower lobe. Scarring in the lingula and left lower lobe. Upper Abdomen: Imaging into the upper abdomen demonstrates no acute findings. Musculoskeletal: Chest wall soft tissues are unremarkable. No acute bony abnormality. 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 Recommend continued follow-up as noted on previous exams. Small right pleural effusion, decreasing since prior study. Patchy airspace disease throughout the right lung most confluent in the right lower lobe concerning for pneumonia. Two adjacent right upper lobe nodules. These could be followed with repeat CT in 6 months. Cardiomegaly, coronary artery disease. Aortic Atherosclerosis (ICD10-I70.0) and Emphysema (ICD10-J43.9). Electronically Signed   By: Rolm Baptise M.D.   On: 03/13/2021 08:20   DG Chest Portable 1 View  Result Date: 03/13/2021 CLINICAL DATA:  Shortness of breath. EXAM: PORTABLE CHEST 1 VIEW COMPARISON:  03/06/2021. FINDINGS: Similar enlargement the cardiac silhouette. Calcific atherosclerosis of the aorta. Ascending thoracic aortic aneurysm better characterized on prior CT chest. Slight increase in a small to moderate right pleural effusion. No visible pneumothorax. Mildly increased diffuse interstitial opacities with patchy airspace opacities. IMPRESSION: 1. Similar cardiomegaly with slight worsening of diffuse interstitial and patchy airspace opacities, most likely pulmonary edema/CHF. Infection not excluded. 2. Slightly increased small to moderate right pleural effusion. Electronically Signed   By: Margaretha Sheffield MD   On: 03/13/2021 06:36   @PROBHOSP @ CT CHEST WO CONTRAST  Result Date: 03/13/2021 CLINICAL DATA:  Shortness of breath EXAM: CT CHEST WITHOUT CONTRAST TECHNIQUE: Multidetector CT imaging of the chest was performed following the standard protocol without IV contrast. COMPARISON:  02/28/2021 FINDINGS: Cardiovascular: Aneurysmal dilatation of the ascending thoracic aorta again noted measuring  up to 4.7 cm, stable. Diffuse aortic atherosclerosis. Cardiomegaly. Diffuse coronary artery calcifications. Mediastinum/Nodes: Stable mildly prominent mediastinal lymph nodes. No axillary or visible hilar adenopathy. Lungs/Pleura: Decreasing right pleural effusion. Residual small right effusion. 2 adjacent nodules in the right upper lobe are again noted measuring 9 mm and 7 mm. Moderate emphysema. Patchy airspace disease throughout the right lung, most confluent in the right lower lobe. Scarring in the lingula and left lower lobe. Upper Abdomen: Imaging into the upper abdomen demonstrates no acute findings. Musculoskeletal: Chest wall soft tissues are unremarkable. No acute bony abnormality. 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 Recommend continued follow-up as noted on  previous exams. Small right pleural effusion, decreasing since prior study. Patchy airspace disease throughout the right lung most confluent in the right lower lobe concerning for pneumonia. Two adjacent right upper lobe nodules. These could be followed with repeat CT in 6 months. Cardiomegaly, coronary artery disease. Aortic Atherosclerosis (ICD10-I70.0) and Emphysema (ICD10-J43.9). Electronically Signed   By: Rolm Baptise M.D.   On: 03/13/2021 08:20   DG Chest Portable 1 View  Result Date: 03/13/2021 CLINICAL DATA:  Shortness of breath. EXAM: PORTABLE CHEST 1 VIEW COMPARISON:  03/06/2021. FINDINGS: Similar enlargement the cardiac silhouette. Calcific atherosclerosis of the aorta. Ascending thoracic aortic aneurysm better characterized on prior CT chest. Slight increase in a small to moderate right pleural effusion. No visible pneumothorax. Mildly increased diffuse interstitial opacities with patchy airspace opacities. IMPRESSION: 1. Similar cardiomegaly with slight worsening of diffuse interstitial and patchy airspace opacities, most likely pulmonary edema/CHF. Infection not excluded. 2. Slightly increased small to  moderate right pleural effusion. Electronically Signed   By: Margaretha Sheffield MD   On: 03/13/2021 06:36        ASSESSMENT AND PLAN    -Multidisciplinary rounds held today  Acute on chronic hypoxic Respiratory Failure -Likely due to decompensated systolic CHF with cardiorenal syndrome -Cardiology and nephrology consultation -Patient may benefit from NIV with BiPAP to unload LV while in pulmonary edema -Gentle diuresis  Accelerated atrial fibrillation -Digoxin on outpatient-obtain digoxin level, repeat twelve-lead EKG -Consider amiodarone during my evaluation heart rate has actually decreased to less than 100 -Patient on warfarin while in chronic stable state-INR is 2.3 today 03/13/2021 ICU telemetry monitoring Metoprolol 25 twice daily  Aspiration pneumonia Continue Unasyn  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 -follow chem 7 -follow UO -continue Foley Catheter-assess need daily Nephrology consultation-appreciate input  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 -Patient followed by hematology/oncology continue Promacta 50 mg -Chronic iron deficiency anemia continue Retacrit  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  -Xopenex every 6 hours as needed  -No need for steroids at this time  Severe protein calorie malnutrition physical deconditioning -Despite increased LOS with need for: Inpatient rehab -PT OT while hospitalized  ID -continue IV abx as prescibed -follow up cultures  GI/Nutrition GI PROPHYLAXIS as indicated DIET-->TF's as tolerated Constipation protocol as indicated  ENDO - ICU hypoglycemic\Hyperglycemia protocol -check FSBS per protocol   ELECTROLYTES -follow labs as needed -replace as needed -pharmacy consultation   DVT/GI PRX ordered -SCDs  TRANSFUSIONS AS NEEDED MONITOR FSBS ASSESS the need for LABS as needed   Critical care provider statement:    Critical care time (minutes):  33   Critical care time was exclusive  of:  Separately billable procedures and treating other patients   Critical care was necessary to treat or prevent imminent or life-threatening deterioration of the following conditions: Acute on chronic hypoxemic respiratory failure, accelerated atrial fibrillation, acute on chronic renal failure, advanced COPD, idiopathic thrombocytopenic purpura, chronic anemia   Critical care was time spent personally by me on the following activities:  Development of treatment plan with patient or surrogate, discussions with consultants, evaluation of patient's response to treatment, examination of patient, obtaining history from patient or surrogate, ordering and performing treatments and interventions, ordering and review of laboratory studies and re-evaluation of patient's condition.  I assumed direction of critical care for this patient from another provider in my specialty: no    This document was prepared using Dragon voice recognition software and may include unintentional dictation errors.    Ottie Glazier, M.D.  Division of Pulmonary & Critical Care Medicine  [facility]                                                                                                        

## 2021-03-13 NOTE — Progress Notes (Addendum)
Called by RN because patient had a change in his condition. He now has an increased oxygen requirement and is currently on 10 L of oxygen via facemask with worsening shortness of breath and rapid atrial fibrillation. Patient is an 80 year old Caucasian male who has had multiple admissions to the hospital in the last 2 weeks for evaluation of shortness of breath.  During his initial admission from 6/29 - 7/4, patient had thoracentesis with drainage of 1.9 L of pleural fluid.  He was also treated with IV antibiotics for multifocal pneumonia.  He was discharged home and returned to the emergency room 24 hours later. During his last hospitalization from 7/5 - 07/07, he was treated for acute on chronic diastolic dysfunction CHF and was also diagnosed with obstructive sleep apnea and was discharged home with a CPAP. He returns to the emergency room 5 days later 07/12 for evaluation of worsening shortness of breath and is found to have a right lower lobe pneumonia suspected to be aspiration pneumonia.  Patient was started on IV Unasyn and on admission he only required 4 L of oxygen. He was seen and evaluated by speech therapy who noted dysphagia and aspiration with all consistencies and recommends the patient be kept n.p.o. He now has an increased oxygen requirement and is on 10 L of oxygen due to pulse oximetry dropping to 88% on 6 L with worsening shortness of breath and is in rapid A. Fib. Had an extensive discussion with patient and his wife at the bedside and they understand that he is critically ill but want him to remain a full code and want all aggressive measures at this time.   On exam Patient is chronically ill-appearing and has an ashen color.  Wife is at the bedside Vital signs T 97.6, blood pressure 106/68, heart rate 105 - 120, respiratory rate 30 Cardiovascular -irregularly irregular, tachycardic, systolic ejection murmur Lungs-rhonchi right base, scattered crackles at both lung  bases   Plan Obtain stat ABG and chest x-ray Transfer patient to stepdown unit Consult critical care Obtain digoxin level Give a dose of metoprolol 5 mg IV x1

## 2021-03-13 NOTE — Evaluation (Signed)
Objective Swallowing Evaluation: Type of Study: MBS-Modified Barium Swallow Study   Patient Details  Name: Samuel Mcknight MRN: [id number] Date of Birth: December 15, 1940  Today's Date: 03/13/2021 Time: SLP Start Time (ACUTE ONLY): 24 -SLP Stop Time (ACUTE ONLY): 1415  SLP Time Calculation (min) (ACUTE ONLY): 60 min   Past Medical History:  Past Medical History:  Diagnosis Date   Atrial fibrillation (HCC)    GAD (generalized anxiety disorder)    Hypertension    Prediabetes    Past Surgical History:  Past Surgical History:  Procedure Laterality Date   CARDIOVERSION N/A 02/24/2020   Procedure: CARDIOVERSION;  Surgeon: Isaias Cowman, MD;  Location: [facility] ORS;  Service: Cardiovascular;  Laterality: N/A;   supraorbital artery repair  2005   TOOTH EXTRACTION     HPI: Pt is a 80 y.o. 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  He states that his symptoms are usually worse at night and he is unable to lay flat in bed.  He is having to sleep sitting upright.  He denies having any cough but states that sometimes when he drinks water he starts coughing.  He denies having any fever or chills but had a low-grade temp upon arrival to the ER.  He denies having any chest pain, no nausea, no vomiting, no abdominal pain, no dizziness, no lightheadedness, no headache, no blurred vision, no changes in his bowel habits, no lower extremity swelling, no focal deficits.  Chest CT: "right pleural effusion. 2 adjacent nodules in the right upper lobe are again  noted measuring 9 mm and 7 mm. Moderate emphysema. Patchy airspace disease throughout the right lung, most confluent in the right lower  lobe. Scarring in the lingula and left lower lobe".  Of  note, per chart notes and pt/Wife report, pt has had several dxs of Pneumonia and 4 thoracentesis procedures in recent ~6 months.   Subjective: pt awake, verbal but appeared weak, fatigued. Noted a cachectic presentation.    Assessment / Plan / Recommendation  CHL IP CLINICAL IMPRESSIONS 03/13/2021  Clinical Impression Pt appears to present w/ SIGNIFICANT Pharyngeal phase Dysphagia w/ frank, SILENT ASPIRATION of thin and Nectar liquids. This degree of ASPIRATION of standard liquids would be expected to impact Pulmonary status w/ result of Pneumonia. Pt's swallowing deficits and ASPIRATION could be in direct relation to the Pulmonary issues he has been experiencing(See Imaging History) in the last ~6 months. Such ASPIRATION could also impact oral intake and jeopardize meeting of adequate hydration/nutrition needs. In attempts to find the least restrictive diet consistency for pt to tolerate, trials of Honey consistency liquids VIA TSP, purees/broken down, mashed solids using a CHIN TUCK and f/u, DRY SWALLOW did not appear to immediately result in Aspiration before or during the swallow. Unfortunately, d/t the Pharyngeal Residue remaining POST swallow, a trace+ amount of bolus residue appeared to lend itself to Laryngeal Penetration into the laryngeal vestible AFTER the swallow. This Laryngeal Penetration AFTER the swallow increases pt's risk for ASPIRATION of (bolus) pharyngeal residue thus Pulmonary decline.   During the pharyngeal phase,    During the oral phase, adequate bolus management and timing noted w/ all consistencies. Pt is missing Most bottle Dentition impacting effective mastication of increased textured foods. During the pharyngeal-Esophageal phase, there was the  appearnce of osteophytes in cervical pharynx-cervical esophagus. Also, a slight+ amount of bolus stasis noted in the cervical Esophagus below the UES -- view obscured by shoulders. It did not appear to increase as bolus trials  continued; no overt retrograde backflow noted during study.  SLP Visit Diagnosis Dysphagia, pharyngeal phase (R13.13)  Attention and concentration deficit following --  Frontal lobe and executive function deficit following --  Impact on safety and function Severe aspiration risk;Risk for inadequate nutrition/hydration      CHL IP TREATMENT RECOMMENDATION 03/13/2021  Treatment Recommendations Defer treatment plan to f/u with SLP     Prognosis 03/13/2021  Prognosis for Safe Diet Advancement Guarded  Barriers to Reach Goals Severity of deficits;Time post onset  Barriers/Prognosis Comment --    CHL IP DIET RECOMMENDATION 03/13/2021  SLP Diet Recommendations NPO  Liquid Administration via --  Medication Administration Via alternative means  Compensations --  Postural Changes --      CHL IP OTHER RECOMMENDATIONS 03/13/2021  Recommended Consults (No Data)  Oral Care Recommendations Oral care QID;Patient independent with oral care  Other Recommendations (No Data)      CHL IP FOLLOW UP RECOMMENDATIONS 03/13/2021  Follow up Recommendations (No Data)      CHL IP FREQUENCY AND DURATION 03/13/2021  Speech Therapy Frequency (ACUTE ONLY) (No Data)  Treatment Duration (No Data)           CHL IP ORAL PHASE 03/13/2021  Oral Phase WFL  Oral - Pudding Teaspoon --  Oral - Pudding Cup --  Oral - Honey Teaspoon --  Oral - Honey Cup --  Oral - Nectar Teaspoon --  Oral - Nectar Cup --  Oral - Nectar Straw --  Oral - Thin Teaspoon --  Oral - Thin Cup --  Oral - Thin Straw --  Oral - Puree --  Oral - Mech Soft --  Oral - Regular --  Oral - Multi-Consistency --  Oral - Pill --  Oral Phase - Comment --    CHL IP PHARYNGEAL PHASE 03/13/2021  Pharyngeal Phase Impaired  Pharyngeal- Pudding Teaspoon --  Pharyngeal --  Pharyngeal- Pudding Cup --  Pharyngeal --  Pharyngeal- Honey Teaspoon Delayed swallow initiation-vallecula;Reduced pharyngeal peristalsis;Reduced anterior laryngeal  mobility;Reduced laryngeal elevation;Penetration/Apiration after swallow;Pharyngeal residue - valleculae;Pharyngeal residue - pyriform;Compensatory strategies attempted (with notebox)  Pharyngeal Material enters airway, remains ABOVE vocal cords and not ejected out  Pharyngeal- Honey Cup --  Pharyngeal --  Pharyngeal- Nectar Teaspoon Delayed swallow initiation-pyriform sinuses;Reduced pharyngeal peristalsis;Reduced anterior laryngeal mobility;Reduced laryngeal elevation;Penetration/Aspiration during swallow;Pharyngeal residue - valleculae;Pharyngeal residue - pyriform;Compensatory strategies attempted (with notebox);Trace aspiration  Pharyngeal Material enters airway, passes BELOW cords without attempt by patient to eject out (silent aspiration)  Pharyngeal- Nectar Cup Delayed swallow initiation-pyriform sinuses;Reduced pharyngeal peristalsis;Reduced anterior laryngeal mobility;Reduced laryngeal elevation;Penetration/Aspiration during swallow;Pharyngeal residue - valleculae;Pharyngeal residue - pyriform;Compensatory strategies attempted (with notebox);Moderate aspiration  Pharyngeal Material enters airway, passes BELOW cords without attempt by patient to eject out (silent aspiration)  Pharyngeal- Nectar Straw --  Pharyngeal --  Pharyngeal- Thin Teaspoon --  Pharyngeal --  Pharyngeal- Thin Cup Delayed swallow initiation-pyriform sinuses;Reduced pharyngeal peristalsis;Reduced anterior laryngeal mobility;Reduced laryngeal elevation;Penetration/Aspiration before swallow;Penetration/Aspiration during swallow;Significant aspiration (Amount);Pharyngeal residue - valleculae;Pharyngeal residue - pyriform;Compensatory strategies attempted (with notebox)  Pharyngeal Material enters airway, passes BELOW cords without attempt by patient to eject out (silent aspiration)  Pharyngeal- Thin Straw --  Pharyngeal --  Pharyngeal- Puree Delayed swallow initiation-vallecula;Reduced pharyngeal peristalsis;Reduced anterior  laryngeal mobility;Reduced laryngeal elevation;Penetration/Apiration after swallow;Trace aspiration;Pharyngeal residue - valleculae;Pharyngeal  residue - pyriform;Compensatory strategies attempted (with notebox)  Pharyngeal Material enters airway, CONTACTS cords and not ejected out  Pharyngeal- Mechanical Soft Delayed swallow initiation-vallecula;Reduced pharyngeal peristalsis;Reduced anterior laryngeal mobility;Reduced laryngeal elevation;Pharyngeal residue - valleculae;Pharyngeal residue - pyriform  Pharyngeal --  Pharyngeal- Regular (No Data)  Pharyngeal --  Pharyngeal- Multi-consistency --  Pharyngeal --  Pharyngeal- Pill (No Data)  Pharyngeal --  Pharyngeal Comment --     CHL IP CERVICAL ESOPHAGEAL PHASE 03/13/2021  Cervical Esophageal Phase Impaired  Pudding Teaspoon --  Pudding Cup --  Honey Teaspoon --  Honey Cup --  Nectar Teaspoon --  Nectar Cup --  Nectar Straw --  Thin Teaspoon --  Thin Cup --  Thin Straw --  Puree --  Mechanical Soft --  Regular --  Multi-consistency --  Pill --  Cervical Esophageal Comment --        [name], MS, CCC-SLP Speech Language Pathologist Rehab Services [id number] [facility] 03/13/2021, 5:28 PM

## 2021-03-13 NOTE — Progress Notes (Signed)
Patient started to complain of difficulty breathing. 02 sats 85% on 4 liters nasal cannula. Turned up to 6 liters nasal cannula, came up to 89%. Patient usually takes xanax at home for anxiety when he has difficulty breathing. He is currently NPO. Dr Claud Kelp messaged about all of the above. Ordered 1 mg IV ativan which was given. Will continue to monitor. Wife at bedside.

## 2021-03-13 NOTE — ED Notes (Signed)
Pt transported to CT at this time.

## 2021-03-13 NOTE — Progress Notes (Signed)
[place] for Warfarin  Indication: atrial fibrillation  Allergies  Allergen Reactions   Bupropion Swelling   Amiodarone Rash    Broke out in dark blue blotches   Diltiazem Rash    Vital Signs: Temp: 98 F (36.7 C) (07/12 0850) Temp Source: Oral (07/12 0808) BP: 102/64 (07/12 0850) Pulse Rate: 94 (07/12 0850)  Labs: Recent Labs    03/13/21 0527  HGB 8.0*  HCT 24.0*  PLT 361  LABPROT 25.5*  INR 2.3*  CREATININE 1.60*  TROPONINIHS 21*    Estimated Creatinine Clearance: 31.2 mL/min (A) (by C-G formula based on SCr of 1.6 mg/dL (H)).   Medical History: Past Medical History:  Diagnosis Date   Atrial fibrillation (HCC)    GAD (generalized anxiety disorder)    Hypertension    Prediabetes     Medications/Assessment: Pharmacy consulted to dose warfarin for Afib in this 80 year old male presenting with SOB.     Pt was on Warfarin 2 mg PO daily, last dose 7/11 AND warfarin 2.5 mg PO QOD, last dose 7/10 (pt takes 2 mg along with 2.5 mg every other day to make dose of 4.5 mg).   Date INR Warfarin dose 7/5   2.7  4.5 mg 7/6   2.3 2 mg 7/7 2.7 4.5 mg  --- 7/11 Unk 2 mg PTA  7/12 2.3 4.5 mg  Goal of Therapy:  INR 2-3  Plan:  INR therapeutic. Will give patient 4.5 mg today (home dose 2 mg QD + 2.5 mg QOD). On 7/5 INR was 2.7 and pt received 4.5 mg INR went to 2.3. Daily INR ordered. CBC at least every 3 days.   Lorna Dibble, PharmD Clinical Pharmacist 03/13/2021 10:17 AM

## 2021-03-13 NOTE — Consult Note (Signed)
   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  Severe aortic stenosis.  Moderate aortic insufficiency.  Moderate mitral regurgitation.  Moderate tricuspid regurgitation.  He presented to the emergency room with complaints of shortness of breath.  Comorbidities:  Atrial fibrillation Anemia Obstructive sleep apnea Hypertension  Medications:  Lanoxin 0.125 mg daily Furosemide 40 mg IV once Metoprolol tartrate 25 mg 2 times a day Warfarin  Labs:  Sodium 136, potassium 5.1, chloride 98, CO2 29, BUN 41, creatinine 1.6, hemoglobin 8, INR 2.3, albumin 3.3, BNP 1152 and troponin 21. Weight is 59.9 kg. Blood pressure 102/64   Initial meeting with patient.  He states that he lives at home with his wife and she does the cooking and keeping track of his medications.  He states that they have a scale at home but he does not weigh himself.  Suggested weighing daily and recording.  Along with what to report to physician.  Discussed his diet dates that he has lost weight and states that he does not have the appetite that he used to.  He also noted that he chokes and coughs on water.  He states that he likes his salt and is not sure he could remove the saltshaker from the table.  He states that they eat out quite frequently.  Pointed out the eating in restaurant portion of the living with heart failure teaching booklet  Also discussed his nonuse of the BiPAP, he states that due to it being uncomfortable and bulky.  Will continue to follow.  Pricilla Riffle  RN CHFN

## 2021-03-14 DIAGNOSIS — J181 Lobar pneumonia, unspecified organism: Secondary | ICD-10-CM | POA: Diagnosis not present

## 2021-03-14 DIAGNOSIS — E43 Unspecified severe protein-calorie malnutrition: Secondary | ICD-10-CM | POA: Insufficient documentation

## 2021-03-14 DIAGNOSIS — Z7189 Other specified counseling: Secondary | ICD-10-CM

## 2021-03-14 LAB — CBC
HCT: 19.7 % — ABNORMAL LOW (ref 39.0–52.0)
Hemoglobin: 6.5 g/dL — ABNORMAL LOW (ref 13.0–17.0)
MCH: 33.2 pg (ref 26.0–34.0)
MCHC: 33 g/dL (ref 30.0–36.0)
MCV: 100.5 fL — ABNORMAL HIGH (ref 80.0–100.0)
Platelets: 294 10*3/uL (ref 150–400)
RBC: 1.96 MIL/uL — ABNORMAL LOW (ref 4.22–5.81)
RDW: 17.8 % — ABNORMAL HIGH (ref 11.5–15.5)
WBC: 16.3 10*3/uL — ABNORMAL HIGH (ref 4.0–10.5)
nRBC: 0 % (ref 0.0–0.2)

## 2021-03-14 LAB — BASIC METABOLIC PANEL
Anion gap: 10 (ref 5–15)
BUN: 40 mg/dL — ABNORMAL HIGH (ref 8–23)
CO2: 28 mmol/L (ref 22–32)
Calcium: 8.8 mg/dL — ABNORMAL LOW (ref 8.9–10.3)
Chloride: 100 mmol/L (ref 98–111)
Creatinine, Ser: 1.6 mg/dL — ABNORMAL HIGH (ref 0.61–1.24)
GFR, Estimated: 43 mL/min — ABNORMAL LOW (ref >60)
Glucose, Bld: 104 mg/dL — ABNORMAL HIGH (ref 70–99)
Potassium: 4.6 mmol/L (ref 3.5–5.1)
Sodium: 138 mmol/L (ref 135–145)

## 2021-03-14 LAB — HEPATITIS B SURFACE ANTIBODY,QUALITATIVE: Hep B S Ab: REACTIVE — AB

## 2021-03-14 LAB — HEPATITIS B CORE ANTIBODY, IGM: Hep B C IgM: NONREACTIVE

## 2021-03-14 LAB — HEPATITIS B SURFACE ANTIGEN: Hepatitis B Surface Ag: NONREACTIVE

## 2021-03-14 LAB — GLUCOSE, CAPILLARY: Glucose-Capillary: 99 mg/dL (ref 70–99)

## 2021-03-14 LAB — PREPARE RBC (CROSSMATCH)

## 2021-03-14 LAB — PROTIME-INR
INR: 2.8 — ABNORMAL HIGH (ref 0.8–1.2)
Prothrombin Time: 29.8 seconds — ABNORMAL HIGH (ref 11.4–15.2)

## 2021-03-14 LAB — HEMOGLOBIN: Hemoglobin: 6.5 g/dL — ABNORMAL LOW (ref 13.0–17.0)

## 2021-03-14 MED ORDER — SODIUM CHLORIDE 0.9% IV SOLUTION
Freq: Once | INTRAVENOUS | Status: AC
Start: 2021-03-14 — End: 2021-03-14

## 2021-03-14 MED ORDER — PANTOPRAZOLE SODIUM 40 MG IV SOLR
40.0000 mg | Freq: Two times a day (BID) | INTRAVENOUS | Status: DC
  Administered 2021-03-14 – 2021-03-15 (×2): 40 mg via INTRAVENOUS
  Filled 2021-03-14 (×2): qty 40

## 2021-03-14 MED ORDER — METOPROLOL TARTRATE 5 MG/5ML IV SOLN
2.5000 mg | Freq: Four times a day (QID) | INTRAVENOUS | Status: DC | PRN
  Administered 2021-03-14: 2.5 mg via INTRAVENOUS
  Filled 2021-03-14: qty 5

## 2021-03-14 MED ORDER — FUROSEMIDE 10 MG/ML IJ SOLN
20.0000 mg | Freq: Every day | INTRAMUSCULAR | Status: DC
  Administered 2021-03-14: 20 mg via INTRAVENOUS
  Filled 2021-03-14 (×2): qty 2

## 2021-03-14 NOTE — Consult Note (Signed)
[facility]  CONSULT NOTE    Date: 03/14/2021                  Patient Name:  Samuel Mcknight  MRN: [id number]  DOB: 1941/05/04  Age / Sex: 80 y.o., male         PCP: Kirk Ruths, MD                 Service Requesting Consult: Dr. Lanney Gins                 Reason for Consult: Acute kidney injury            History of Present Illness: Mr. Boysie Bonebrake admitted for aspiration pneumonia. Patient is presents with his wife. Patient required high flow nasal canula on admission. Febrile. Placed on cefepime and vanco in the ED. Transitioned to Unasyn. Patient admitted to the ICU. He is now breathing better with 5 liters [place] O2. Patient has a history of chronic kidney disease. He did follow with a nephrologist in [place] but has not seen a nephrologist since moving to this area. Denies use of nonsteroidal anti-inflammatory agents.   Patient has had multiple admissions in the last few months with acute CHF and acute exacerbation of COPD. His creatinine 1.4-1.7.   Takes warfarin for chronic atrial fibrillation   Medications: Outpatient medications: Medications Prior to Admission  Medication Sig Dispense Refill Last Dose   ALPRAZolam (XANAX) 0.5 MG tablet Take 1 mg by mouth 2 (two) times daily as needed for anxiety.      digoxin (LANOXIN) 0.125 MG tablet Take 0.125 mg by mouth daily.   03/12/2021   eltrombopag (PROMACTA) 25 MG tablet Take 1 tablet (25 mg total) by mouth daily. Take on an empty stomach 1 hour before a meal or 2 hours after 30 tablet 6 03/12/2021   furosemide (LASIX) 40 MG tablet Take 1 tablet (40 mg total) by mouth daily. Increase from 20 mg daily. 90 tablet 0 03/12/2021   metoprolol tartrate (LOPRESSOR) 25 MG tablet Take 1 tablet (25 mg total) by mouth 2 (two) times daily. 60 tablet 0 03/12/2021 at 2200   warfarin (COUMADIN) 2 MG tablet Take 2 mg by mouth daily.   03/12/2021   warfarin (COUMADIN) 2.5 MG tablet Take 2.5 mg by mouth every other day.   Past  Week    Current medications: Current Facility-Administered Medications  Medication Dose Route Frequency Provider Last Rate Last Admin   0.9 %  sodium chloride infusion (Manually program via Guardrails IV Fluids)   Intravenous Once Ralene Muskrat B, MD       ALPRAZolam Duanne Moron) tablet 0.5 mg  0.5 mg Oral BID PRN Agbata, Tochukwu, MD       Ampicillin-Sulbactam (UNASYN) 3 g in sodium chloride 0.9 % 100 mL IVPB  3 g Intravenous Q8H Beers, Shanon Brow, RPH   Stopped at 03/14/21 4580   Chlorhexidine Gluconate Cloth 2 % PADS 6 each  6 each Topical Daily Ottie Glazier, MD   6 each at 03/13/21 1903   digoxin (LANOXIN) tablet 0.125 mg  0.125 mg Oral Daily Agbata, Tochukwu, MD   0.125 mg at 03/13/21 1201   furosemide (LASIX) injection 20 mg  20 mg Intravenous Daily Quorra Rosene, MD       levalbuterol (XOPENEX) nebulizer solution 0.63 mg  0.63 mg Nebulization Q8H PRN Aleskerov, Fuad, MD       LORazepam (ATIVAN) injection 1 mg  1 mg Intravenous Q6H PRN Ouma,  Bing Neighbors, NP       MEDLINE mouth rinse  15 mL Mouth Rinse BID Aleskerov, Fuad, MD       metoprolol tartrate (LOPRESSOR) tablet 25 mg  25 mg Oral BID Agbata, Tochukwu, MD   25 mg at 03/13/21 1200   warfarin (COUMADIN) tablet 2 mg  2 mg Oral q1600 Beers, Shanon Brow, RPH       And   warfarin (COUMADIN) tablet 2.5 mg  2.5 mg Oral Q48H Beers, Shanon Brow, [facility], [place]       Warfarin - Pharmacist Dosing Inpatient   Does not apply [id number] Lorna Dibble, RPH          Allergies: Allergies  Allergen Reactions   Bupropion Swelling   Amiodarone Rash    Broke out in dark blue blotches   Diltiazem Rash      Past Medical History: Past Medical History:  Diagnosis Date   Atrial fibrillation (HCC)    GAD (generalized anxiety disorder)    Hypertension    Prediabetes      Past Surgical History: Past Surgical History:  Procedure Laterality Date   CARDIOVERSION N/A 02/24/2020   Procedure: CARDIOVERSION;  Surgeon: Isaias Cowman, MD;   Location: [facility] ORS;  Service: Cardiovascular;  Laterality: N/A;   supraorbital artery repair  2005   TOOTH EXTRACTION       Family History: Family History  Problem Relation Age of Onset   Lung cancer Father      Social History: Social History   Socioeconomic History   Marital status: Married    Spouse name: Not on file   Number of children: Not on file   Years of education: Not on file   Highest education level: Not on file  Occupational History   Not on file  Tobacco Use   Smoking status: Former    Pack years: 0.00    Types: Cigarettes   Smokeless tobacco: Former    Quit date: 2006  Vaping Use   Vaping Use: Former   Quit date: 03/14/2005  Substance and Sexual Activity   Alcohol use: Not Currently    Comment: quit    Drug use: Not Currently   Sexual activity: Not on file  Other Topics Concern   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    Social Determinants of Health   Financial Resource Strain: Not on file  Food Insecurity: Not on file  Transportation Needs: Not on file  Physical Activity: Not on file  Stress: Not on file  Social Connections: Not on file  Intimate Partner Violence: Not on file     Review of Systems: Review of Systems  Constitutional: Negative.   HENT: Negative.    Eyes: Negative.   Respiratory:  Positive for cough, sputum production and shortness of breath. Negative for hemoptysis and wheezing.   Cardiovascular:  Negative for chest pain, palpitations, orthopnea, claudication, leg swelling and PND.  Gastrointestinal: Negative.   Genitourinary:  Negative for dysuria, flank pain, frequency, hematuria and urgency.  Musculoskeletal:  Negative for back pain, falls, joint pain, myalgias and neck pain.  Skin: Negative.   Neurological: Negative.   Endo/Heme/Allergies: Negative.   Psychiatric/Behavioral:  The patient is nervous/anxious.    Vital Signs: Blood  pressure (!) 96/57, pulse 90, temperature 97.6 F (36.4 C), temperature source Oral, resp. rate 20, height 5\' 7"  (1.702 m), weight 55.5 kg, SpO2 100 %.  Weight  trends: Filed Weights   03/13/21 0517 03/13/21 0850 03/13/21 1900  Weight: 63.5 kg 59.9 kg 55.5 kg    Physical Exam: General: NAD, laying in bed  Head: Normocephalic, atraumatic. Moist oral mucosal membranes  Eyes: Anicteric, PERRL  Neck: Supple, trachea midline  Lungs:  Irregular,  +murmur  Heart: Regular rate and rhythm  Abdomen:  Soft, nontender,   Extremities:  No peripheral edema.  Neurologic: Nonfocal, moving all four extremities  Skin: No lesions  Access: none     Lab results: Basic Metabolic Panel: Recent Labs  Lab 03/08/21 0518 03/13/21 0527 03/14/21 0622  NA 135 136 138  K 4.1 5.1 4.6  CL 99 98 100  CO2 31 29 28   GLUCOSE 88 110* 104*  BUN 36* 41* 40*  CREATININE 1.51* 1.60* 1.60*  CALCIUM 8.7* 9.0 8.8*  MG 1.8  --   --     Liver Function Tests: Recent Labs  Lab 03/13/21 0527  AST 41  ALT 26  ALKPHOS 108  BILITOT 2.0*  PROT 8.4*  ALBUMIN 3.3*   No results for input(s): LIPASE, AMYLASE in the last 168 hours. No results for input(s): AMMONIA in the last 168 hours.  CBC: Recent Labs  Lab 03/08/21 0518 03/13/21 0527 03/14/21 0622  WBC 12.2* 13.4* 16.3*  NEUTROABS  --  10.3*  --   HGB 8.1* 8.0* 6.5*  HCT 24.6* 24.0* 19.7*  MCV 95.7 97.2 100.5*  PLT 284 361 294    Cardiac Enzymes: No results for input(s): CKTOTAL, CKMB, CKMBINDEX, TROPONINI in the last 168 hours.  BNP: Invalid input(s): POCBNP  CBG: No results for input(s): GLUCAP in the last 168 hours.  Microbiology: Results for orders placed or performed during the hospital encounter of 03/13/21  Blood culture (routine x 2)     Status: None (Preliminary result)   Collection Time: 03/13/21  5:27 AM   Specimen: BLOOD  Result Value Ref Range Status   Specimen Description BLOOD LEFT ARM  Final   Special Requests   Final     BOTTLES DRAWN AEROBIC AND ANAEROBIC Blood Culture adequate volume   Culture   Final    NO GROWTH 1 DAY Performed at [facility], [address] [place], [place] [address]    Report Status PENDING  Incomplete  Blood culture (routine x 2)     Status: None (Preliminary result)   Collection Time: 03/13/21  5:27 AM   Specimen: BLOOD  Result Value Ref Range Status   Specimen Description BLOOD LEFT ARM  Final   Special Requests   Final    BOTTLES DRAWN AEROBIC AND ANAEROBIC Blood Culture adequate volume   Culture   Final    NO GROWTH 1 DAY Performed at [facility], [address] [place], [place] [address]    Report Status PENDING  Incomplete  Resp Panel by RT-PCR (Flu A&B, Covid) Nasopharyngeal Swab     Status: None   Collection Time: 03/13/21  5:28 AM   Specimen: Nasopharyngeal Swab; Nasopharyngeal(NP) swabs in vial transport medium  Result Value Ref Range Status   SARS Coronavirus 2 by RT PCR NEGATIVE NEGATIVE Final    Comment: (NOTE) SARS-CoV-2 target nucleic acids are NOT DETECTED.  The SARS-CoV-2 RNA is generally detectable in upper respiratory specimens during the acute phase of infection. The lowest concentration of SARS-CoV-2 viral copies this assay can detect is 138 copies/mL. A negative result does not preclude SARS-Cov-2 infection and should not be used as the sole basis for  treatment or other patient management decisions. A negative result may occur with  improper specimen collection/handling, submission of specimen other than nasopharyngeal swab, presence of viral mutation(s) within the areas targeted by this assay, and inadequate number of viral copies(<138 copies/mL). A negative result must be combined with clinical observations, patient history, and epidemiological information. The expected result is Negative.  Fact Sheet for Patients:  EntrepreneurPulse.com.au  Fact Sheet for Healthcare Providers:   IncredibleEmployment.be  This test is no t yet approved or cleared by the [place] FDA and  has been authorized for detection and/or diagnosis of SARS-CoV-2 by FDA under an Emergency Use Authorization (EUA). This EUA will remain  in effect (meaning this test can be used) for the duration of the COVID-19 declaration under Section 564(b)(1) of the Act, 21 U.S.C.section 360bbb-3(b)(1), unless the authorization is terminated  or revoked sooner.       Influenza A by PCR NEGATIVE NEGATIVE Final   Influenza B by PCR NEGATIVE NEGATIVE Final    Comment: (NOTE) The Xpert Xpress SARS-CoV-2/FLU/RSV plus assay is intended as an aid in the diagnosis of influenza from Nasopharyngeal swab specimens and should not be used as a sole basis for treatment. Nasal washings and aspirates are unacceptable for Xpert Xpress SARS-CoV-2/FLU/RSV testing.  Fact Sheet for Patients: EntrepreneurPulse.com.au  Fact Sheet for Healthcare Providers: IncredibleEmployment.be  This test is not yet approved or cleared by the [place] FDA and has been authorized for detection and/or diagnosis of SARS-CoV-2 by FDA under an Emergency Use Authorization (EUA). This EUA will remain in effect (meaning this test can be used) for the duration of the COVID-19 declaration under Section 564(b)(1) of the Act, 21 U.S.C. section 360bbb-3(b)(1), unless the authorization is terminated or revoked.  Performed at [facility], [address] [place], [place] [address]   MRSA Next Gen by PCR, Nasal     Status: None   Collection Time: 03/13/21  6:58 PM   Specimen: Nasal Mucosa; Nasal Swab  Result Value Ref Range Status   MRSA by PCR Next Gen NOT DETECTED NOT DETECTED Final    Comment: (NOTE) The GeneXpert MRSA Assay (FDA approved for NASAL specimens only), is one component of a comprehensive MRSA colonization surveillance program. It is not intended to  diagnose MRSA infection nor to guide or monitor treatment for MRSA infections. Test performance is not FDA approved in patients less than 9 years old. Performed at [facility], [address] [place], [place] [address]     Coagulation Studies: Recent Labs    03/13/21 0527 03/14/21 0622  LABPROT 25.5* 29.8*  INR 2.3* 2.8*    Urinalysis: No results for input(s): COLORURINE, LABSPEC, PHURINE, GLUCOSEU, HGBUR, BILIRUBINUR, KETONESUR, PROTEINUR, UROBILINOGEN, NITRITE, LEUKOCYTESUR in the last 72 hours.  Invalid input(s): APPERANCEUR    Imaging: CT CHEST WO CONTRAST  Result Date: 03/13/2021 CLINICAL DATA:  Shortness of breath EXAM: CT CHEST WITHOUT CONTRAST TECHNIQUE: Multidetector CT imaging of the chest was performed following the standard protocol without IV contrast. COMPARISON:  02/28/2021 FINDINGS: Cardiovascular: Aneurysmal dilatation of the ascending thoracic aorta again noted measuring up to 4.7 cm, stable. Diffuse aortic atherosclerosis. Cardiomegaly. Diffuse coronary artery calcifications. Mediastinum/Nodes: Stable mildly prominent mediastinal lymph nodes. No axillary or visible hilar adenopathy. Lungs/Pleura: Decreasing right pleural effusion. Residual small right effusion. 2 adjacent nodules in the right upper lobe are again noted measuring 9 mm and 7 mm. Moderate emphysema. Patchy airspace disease throughout the right lung, most confluent in the right lower lobe. Scarring  in the lingula and left lower lobe. Upper Abdomen: Imaging into the upper abdomen demonstrates no acute findings. Musculoskeletal: Chest wall soft tissues are unremarkable. No acute bony abnormality. 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 Recommend continued follow-up as noted on previous exams. Small right pleural effusion, decreasing since prior study. Patchy airspace disease throughout the right lung most confluent in the right lower lobe concerning for pneumonia. Two adjacent  right upper lobe nodules. These could be followed with repeat CT in 6 months. Cardiomegaly, coronary artery disease. Aortic Atherosclerosis (ICD10-I70.0) and Emphysema (ICD10-J43.9). Electronically Signed   By: Rolm Baptise M.D.   On: 03/13/2021 08:20   DG Chest Port 1 View  Result Date: 03/13/2021 CLINICAL DATA:  Shortness of breath EXAM: PORTABLE CHEST 1 VIEW COMPARISON:  Film from earlier in the same day. FINDINGS: Cardiac shadow remains enlarged. Lungs are again hyperinflated with diffuse interstitial densities worse on the right than the left. Right-sided effusion is noted as well. The overall appearance is similar to that seen on the prior exam. No new focal abnormality is seen. IMPRESSION: Stable appearance of the chest when compared with earlier in the same day. Electronically Signed   By: Inez Catalina M.D.   On: 03/13/2021 19:24   DG Chest Portable 1 View  Result Date: 03/13/2021 CLINICAL DATA:  Shortness of breath. EXAM: PORTABLE CHEST 1 VIEW COMPARISON:  03/06/2021. FINDINGS: Similar enlargement the cardiac silhouette. Calcific atherosclerosis of the aorta. Ascending thoracic aortic aneurysm better characterized on prior CT chest. Slight increase in a small to moderate right pleural effusion. No visible pneumothorax. Mildly increased diffuse interstitial opacities with patchy airspace opacities. IMPRESSION: 1. Similar cardiomegaly with slight worsening of diffuse interstitial and patchy airspace opacities, most likely pulmonary edema/CHF. Infection not excluded. 2. Slightly increased small to moderate right pleural effusion. Electronically Signed   By: Margaretha Sheffield MD   On: 03/13/2021 06:36     Assessment & Plan: Mr. Thoren Hosang is a 80 y.o. white male with COPD on 3-5 liters Oxygen, aortic valve stenosis, ascending aorta aneurysm, hypertension, thrombocytopenia, MGUS, anxiety, who was admitted to [facility] on 03/13/2021 for Recurrent pneumonia [J18.9] Acute CHF (congestive heart failure)  ([place]) [I50.9] Acute on chronic congestive heart failure, unspecified heart failure type ([place]) [I50.9] Acute on chronic respiratory failure (HCC) [J96.20]  Chronic kidney disease stage IIIB: history of bland urine. History of MGUS and hypertension. Denies history of diabetes. Denies use of nonsteroidal anti-inflammatory agents. Ultrasound from 06/21/2019 with chronic kidney disease. No IV contrast exposure.  - Suspect CKD secondary to hypertension, CHF and MGUS.  - Not on anARB/ACE-I or SGLT-2 inhibitor as an outpatient.  - Check urine to protein to creatinine ratio.  - check hepatitis panel  Hypertension and acute exacerbation of congestive heart failure: echocardiolgram from 01/08/21. EF of 50%.  - Continue furosemide, change to IV.  - Continue metoprolol.  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 Platelets within normal limits  - Scheduled for PRBC transfusion today.  - Appreciate hematology input.  Acute respiratory failure on chronic obstructive lung disease: oxygen requirements are now closer to his baseline. Secondary to pneumonia. Concerning for aspiration. Placed on Unasyn. - Appreciate pulmonary input     LOS: 1 Reyes Fifield 7/13/20229:43 AM

## 2021-03-14 NOTE — Progress Notes (Signed)
Speech Language Pathology Treatment: Dysphagia  Patient Details Name: Samuel Mcknight MRN: [id number] DOB: Jan 27, 1941 Today's Date: 03/13/2021 Time: 1630-1730 SLP Time Calculation (min) (ACUTE ONLY): 60 min  Assessment / Plan / Recommendation Clinical Impression  Pt seen for education re: swallow function post MBSS today. Pt is pending consultation by Palliative Care services re: [place] d/t Baseline medical status (nonrheumatic aortic valve stenosis, severe aortic stenosis, shortness of breath, chronic respiratory failure requiring oxygen supplementation at baseline) and newly dx'd Pharyngeal phase Dysphagia seen on the MBSS.    Much education was had re: results of the MBSS; aspiration precautions; swallowing strategies; model and practice using the swallowing strategies; food consistencies; ordering information re: thickening products/agents; food preparation and options. Pt was able to demonstrate use of all strategies(independently) that were used during the MBSS if he were to consume an oral diet; Wife present taught strategies   Further discussion w/ pt and Wife answering general questions they had. MBSS video viewed on the computer to provide education and discussion on Pharyngeal swallowing and resulting Aspiration. Brief education given on options at this point including alternative means of feeding(PEG). Strongly recommended f/u w/ MD and Palliative Care for discussion on [place] in light of pt's Baseline Medical status. Discussed the option of a Dysphagia diet w/ precautions and strategies including Chin Tuck w/ ALL swallows; f/u, DRY swallows.   ST services will be available for further education, information post Palliative Care meeting w/ pt/Wife. Pt agreed.        HPI HPI: Pt is a 80 y.o. male with medical history significant for chronic atrial fibrillation, COPD with chronic respiratory failure on 4 L of oxygen, cardiomegaly, hypertension and anxiety who was recently discharged from the  hospital and presents again today for evaluation of mostly with exertion mostly with exertion for about 3 days.  He states that his symptoms are usually worse at night and he is unable to lay flat in bed.  He is having to sleep sitting upright.  He denies having any cough but states that sometimes when he drinks water he starts coughing.  He denies having any fever or chills but had a low-grade temp upon arrival to the ER.  He denies having any chest pain, no nausea, no vomiting, no abdominal pain, no dizziness, no lightheadedness, no headache, no blurred vision, no changes in his bowel habits, no lower extremity swelling, no focal deficits.  Chest CT: "right pleural effusion. 2 adjacent nodules in the right upper lobe are again  noted measuring 9 mm and 7 mm. Moderate emphysema. Patchy airspace disease throughout the right lung, most confluent in the right lower  lobe. Scarring in the lingula and left lower lobe".  Of note, per chart notes and pt/Wife report, pt has had several dxs of Pneumonia and 4 thoracentesis procedures in recent ~6 months.  MBSS completed this admit revealing Severe Pharyngeal phase Dysphagia w/ frank aspiration of thin and Nectar liquids; aspiration of pharyngeal residue in between trials. Palliative Care following pt for GOC.      SLP Plan  Continue with current plan of care (further education)       Recommendations  Diet recommendations: NPO Liquids provided via: Teaspoon Medication Administration: Whole meds with puree (Crush if needed for ease of swallow) Supervision: Patient able to self feed Compensations: Minimize environmental distractions;Slow rate;Small sips/bites;Lingual sweep for clearance of pocketing;Multiple dry swallows after each bite/sip;Clear throat intermittently;Chin tuck Postural Changes and/or Swallow Maneuvers: Out of bed for meals;Seated upright 90 degrees;Upright 30-60 min  after meal                General recommendations:  (Palliative Care  consult for [place]) Oral Care Recommendations: Oral care QID;Patient independent with oral care Follow up Recommendations:  (TBD) SLP Visit Diagnosis: Dysphagia, pharyngeal phase (R13.13) (Aspiration) Plan: Continue with current plan of care (further education)       GO                 Samuel Mcknight, [place], [place] Pathologist Rehab Services [phone] [facility] 03/14/2021, 7:08 PM

## 2021-03-14 NOTE — Progress Notes (Signed)
Initial Nutrition Assessment  DOCUMENTATION CODES:   Severe malnutrition in context of chronic illness  INTERVENTION:   RD will monitor for diet advancement vs the need for nutrition support  Pt is at high refeed risk  NUTRITION DIAGNOSIS:   Severe Malnutrition related to chronic illness (CHF, COPD) as evidenced by moderate fat depletion, severe fat depletion, severe muscle depletion,  29 percent weight loss in 1 year.  GOAL:   Patient will meet greater than or equal to 90% of their needs  MONITOR:   Diet advancement, Labs, Weight trends, Skin, I & O's  REASON FOR ASSESSMENT:   Consult Enteral/tube feeding initiation and management  ASSESSMENT:   80 y/o male with h/o Afib, CHF, MGUS, CKD III, HTN and dysphagia with recurrent aspiration PNA who is admitted with aspiration PNA  Met with pt and pt's wife in room today. Pt reports fair appetite and oral intake at baseline. Wife at bedside reports that pt eats, he just does not eat a lot. Pt does drink Ensure daily at home. Wife reports a significant weight loss over the past 8 months. Per chart, pt is down 50lbs(29%) over the past year; this is significant weight loss. Pt reports that his UBW is ~197lbs but that recently he was weighing ~172lbs. Wife reports pt coughs sometimes after eating and drinking. Per wife report, pt has had multiple admissions for aspiration PNA and pt reports that he believes he has been aspirating for ~1 year. Pt seen by SLP and is now s/p MBSS and is recommended to be NPO. Pt and wife with numerous questions today regarding feeding tube and how this works, etc. Nasogastric tube is not recommended in this case as this is only a temporary solution and aspiration in this pt appears to be chronic. RD spoke with pt and wife regarding the different types of permanent feedings tubes and answered all of their questions. RD also discussed comfort feeds with pt if they decide for a more comfort based approach. Family is  currently deciding about feeding tube. Palliative care consult is pending. Pt is likely at high refeed risk. RD will monitor for GOC vs diet advancement vs the need nutrition support.    Medications reviewed and include: lasix, warfarin, unasyn   Labs reviewed: K 4.6 wnl, BUN 40(H), creat 1.60(H) BNP- 1158.2(H)- 7/12 Wbc- 16.3(H), Hgb 6.5(L), Hct 19.7(L), MCV 100.5(H)  NUTRITION - FOCUSED PHYSICAL EXAM:  Flowsheet Row Most Recent Value  Orbital Region Moderate depletion  Upper Arm Region Severe depletion  Thoracic and Lumbar Region Severe depletion  Buccal Region Moderate depletion  Temple Region Moderate depletion  Clavicle Bone Region Severe depletion  Clavicle and Acromion Bone Region Severe depletion  Scapular Bone Region Severe depletion  Dorsal Hand Severe depletion  Patellar Region Severe depletion  Anterior Thigh Region Severe depletion  Posterior Calf Region Severe depletion  Edema (RD Assessment) Mild  Hair Reviewed  Eyes Reviewed  Mouth Reviewed  Skin Reviewed  Nails Reviewed   Diet Order:   Diet Order             Diet NPO time specified  Diet effective now                  EDUCATION NEEDS:   Education needs have been addressed  Skin:  Skin Assessment: Reviewed RN Assessment (Stage I coccyx and L ear)  Last BM:  7/13- type 1  Height:   Ht Readings from Last 1 Encounters:  03/13/21 5' 7" (1.702 m)  Weight:   Wt Readings from Last 1 Encounters:  03/13/21 55.5 kg    Ideal Body Weight:  67.3 kg  BMI:  Body mass index is 19.16 kg/m.  Estimated Nutritional Needs:   Kcal:  1700-1900kcal/day  Protein:  85-95g/day  Fluid:  1.4-1.7L/day  Koleen Distance MS, RD, LDN Please refer to [facility] for RD and/or RD on-call/weekend/after hours pager

## 2021-03-14 NOTE — Progress Notes (Signed)
PROGRESS NOTE    Samuel Mcknight  WNU:[id number] DOB: 11-Jan-1941 DOA: 03/13/2021 PCP: Kirk Ruths, MD   Brief Narrative:   80 y.o. 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  He states that his symptoms are usually worse at night and he is unable to lay flat in bed.  He is having to sleep sitting upright.  He denies having any cough but states that sometimes when he drinks water he starts coughing.  He denies having any fever or chills but had a low-grade temp upon arrival to the ER.  On the night of admission patient was emergently transferred to the intensive care unit with intensivist consult due to worsening respiratory status and increased oxygen demand.  His respiratory status is stabilized and is returned to near baseline.  SLP is evaluated patient and deemed him a very high aspiration risk and recommended strict n.p.o.  Patient was seen by palliative care and after conversation the patient is a refusing an enteral feeding tube placement.  ICU on consult as well as nephrology and hematology.  Appreciate recommendations from a consult providers.   Assessment & Plan:   Principal Problem:   Lobar pneumonia ([place]) Active Problems:   Anemia of chronic kidney failure, stage 3 (moderate)   Atrial fibrillation, chronic (HCC)   Essential hypertension   Chronic respiratory failure (HCC)   Chronic diastolic CHF (congestive heart failure) (HCC)   Aortic stenosis   Acute on chronic respiratory failure (HCC)   Dysphagia, oropharyngeal phase   Pressure injury of skin   Protein-calorie malnutrition, severe  Aspiration pneumonia Sepsis secondary to above Sepsis criteria met with tachycardia, tachypnea, leukocytosis Patient is very high  aspiration risk CT scan of the chest shows a right lower lobe pneumonia concerning for aspiration pneumonia SLP is recommended strict n.p.o. Patient is now refusing enteral feeding tube placement Plan: SLP follow-up Continue Unasyn for aspiration pneumonia Follow cultures Wean oxygen as tolerated Maintain stepdown status for now Appreciate pulmonary recommendations   Chronic diastolic dysfunction CHF Patient presents for evaluation of chronic and progressively worsening shortness of breath Symptoms are worse with exertion and associated with orthopnea Most likely related to severe aortic stenosis TAVR has been discussed with patient who is hesitant about getting the procedure Plan: Continue metoprolol Lasix on hold in setting of hypotension Palliative care discussion Will attempt to medically optimize and discharge home with hospice services  Chronic atrial fibrillation Rate controlled Continue digoxin and metoprolol if patient can tolerate oral medications Patient currently on Coumadin however this is on hold given patient's inability to tolerate p.o. medications   Anxiety disorder Continue alprazolam 0.5 mg p.o. 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   Difficult to place patient No       Level of care: Stepdown  Consultants:   ICU Nephrology Oncology Palliative care  Procedures:  None  Antimicrobials:  Unasyn   Subjective: Seen and examined.  Reports improvement in symptoms since admission.  No new complaints  Objective: Vitals:   03/14/21 1415 03/14/21 1430 03/14/21 1445 03/14/21 1500  BP: (!) 101/50 (!) 94/59 (!) 100/54 (!) 110/57  Pulse: (!) 110 93 99 (!) 46  Resp: (!) 23 20 (!) 22 (!) 26  Temp:      TempSrc:      SpO2: 100% 100% 100% 100%  Weight:      Height:        Intake/Output Summary (Last 24 hours) at 03/14/2021 1554 Last data filed at 03/14/2021 1312 Gross per 24 hour  Intake 618.14 ml  Output 150 ml  Net 468.14 ml   Filed Weights   03/13/21 0517 03/13/21 0850 03/13/21 1900  Weight: 63.5 kg 59.9 kg 55.5 kg    Examination:  General exam: No acute distress.  Appears frail and chronically ill Respiratory system: Coarse crackles bilaterally.  Normal work of breathing.  4 L Cardiovascular system: S1-S2, regular rate, irregular rhythm, no murmurs, no pedal edema Gastrointestinal system: Thin, nontender, nondistended, normal bowel sounds Central nervous system: Alert and oriented. No focal neurological deficits. Extremities: Symmetric 5 x 5 power. Skin: No rashes, lesions or ulcers Psychiatry: Judgement and insight appear normal. Mood & affect appropriate.     Data Reviewed: I have personally reviewed following labs and imaging studies  CBC: Recent Labs  Lab 03/08/21 0518 03/13/21 0527 03/14/21 0622 03/14/21 1020  WBC 12.2* 13.4* 16.3*  --   NEUTROABS  --  10.3*  --   --   HGB 8.1* 8.0* 6.5* 6.5*  HCT 24.6* 24.0* 19.7*  --   MCV 95.7 97.2 100.5*  --   PLT 284 361 294  --    Basic Metabolic Panel: Recent Labs  Lab 03/08/21 0518 03/13/21 0527 03/14/21 0622  NA 135 136 138  K 4.1 5.1 4.6  CL 99 98 100  CO2 '31 29 28  ' GLUCOSE 88 110* 104*  BUN 36* 41* 40*  CREATININE 1.51* 1.60* 1.60*  CALCIUM 8.7* 9.0 8.8*  MG 1.8  --   --    GFR: Estimated Creatinine  Clearance: 28.9 mL/min (A) (by C-G formula based on SCr of 1.6 mg/dL (H)). Liver Function Tests: Recent Labs  Lab 03/13/21 0527  AST 41  ALT 26  ALKPHOS 108  BILITOT 2.0*  PROT 8.4*  ALBUMIN 3.3*   No results for input(s): LIPASE, AMYLASE in the last 168 hours. No results for input(s): AMMONIA in the last 168 hours. Coagulation Profile: Recent Labs  Lab 03/08/21 0518 03/13/21 0527 03/14/21 0622  INR 2.7* 2.3* 2.8*   Cardiac Enzymes: No results for input(s): CKTOTAL, CKMB, CKMBINDEX, TROPONINI in the last 168 hours. BNP (last 3 results) No results for input(s): PROBNP in the last 8760 hours. HbA1C: No results for input(s): HGBA1C in the last 72 hours. CBG: Recent Labs  Lab 03/13/21 1857  GLUCAP 99   Lipid Profile: No results for input(s): CHOL, HDL, LDLCALC, TRIG, CHOLHDL, LDLDIRECT in the last 72 hours. Thyroid Function Tests: No results for input(s): TSH, T4TOTAL, FREET4, T3FREE, THYROIDAB in the last 72 hours. Anemia Panel: No results for input(s): VITAMINB12, FOLATE, FERRITIN, TIBC, IRON, RETICCTPCT in the last 72 hours. Sepsis Labs:  Recent Labs  Lab 03/08/21 0518 03/13/21 0527 03/13/21 0850  PROCALCITON 0.51 0.23  --   LATICACIDVEN  --  1.4 1.1    Recent Results (from the past 240 hour(s))  Resp Panel by RT-PCR (Flu A&B, Covid) Nasopharyngeal Swab     Status: None   Collection Time: 03/06/21  6:46 PM   Specimen: Nasopharyngeal Swab; Nasopharyngeal(NP) swabs in vial transport medium  Result Value Ref Range Status   SARS Coronavirus 2 by RT PCR NEGATIVE NEGATIVE Final    Comment: (NOTE) SARS-CoV-2 target nucleic acids are NOT DETECTED.  The SARS-CoV-2 RNA is generally detectable in upper respiratory specimens during the acute phase of infection. The lowest concentration of SARS-CoV-2 viral copies this assay can detect is 138 copies/mL. A negative result does not preclude SARS-Cov-2 infection and should not be used as the sole basis for treatment  or other patient management decisions. A negative result may occur with  improper specimen collection/handling, submission of specimen other than nasopharyngeal swab, presence of viral mutation(s) within the areas targeted by this assay, and inadequate number of viral copies(<138 copies/mL). A negative result must be combined with clinical observations, patient history, and epidemiological information. The expected result is Negative.  Fact Sheet for Patients:  EntrepreneurPulse.com.au  Fact Sheet for Healthcare Providers:  IncredibleEmployment.be  This test is no t yet approved or cleared by the [place] FDA and  has been authorized for detection and/or diagnosis of SARS-CoV-2 by FDA under an Emergency Use Authorization (EUA). This EUA will remain  in effect (meaning this test can be used) for the duration of the COVID-19 declaration under Section 564(b)(1) of the Act, 21 U.S.C.section 360bbb-3(b)(1), unless the authorization is terminated  or revoked sooner.       Influenza A by PCR NEGATIVE NEGATIVE Final   Influenza B by PCR NEGATIVE NEGATIVE Final    Comment: (NOTE) The Xpert Xpress SARS-CoV-2/FLU/RSV plus assay is intended as an aid in the diagnosis of influenza from Nasopharyngeal swab specimens and should not be used as a sole basis for treatment. Nasal washings and aspirates are unacceptable for Xpert Xpress SARS-CoV-2/FLU/RSV testing.  Fact Sheet for Patients: EntrepreneurPulse.com.au  Fact Sheet for Healthcare Providers: IncredibleEmployment.be  This test is not yet approved or cleared by the [place] FDA and has been authorized for detection and/or diagnosis of SARS-CoV-2 by FDA under an Emergency Use Authorization (EUA). This EUA will remain in effect (meaning this test can be used) for the duration of the COVID-19 declaration under Section 564(b)(1) of the Act, 21 U.S.C. section  360bbb-3(b)(1), unless the authorization is terminated or revoked.  Performed at [facility], [address] [place], [place] [address]   Blood culture (routine x 2)     Status: None (Preliminary result)   Collection Time: 03/13/21  5:27 AM   Specimen: BLOOD  Result Value Ref Range Status   Specimen Description BLOOD LEFT ARM  Final   Special Requests   Final    BOTTLES DRAWN AEROBIC AND ANAEROBIC Blood Culture adequate volume   Culture   Final    NO GROWTH 1 DAY Performed at [facility], [address] [place], [place] [address]    Report Status PENDING  Incomplete  Blood culture (routine x 2)     Status: None (Preliminary result)   Collection Time: 03/13/21  5:27 AM   Specimen: BLOOD  Result Value Ref Range Status   Specimen Description BLOOD LEFT ARM  Final   Special Requests  Final    BOTTLES DRAWN AEROBIC AND ANAEROBIC Blood Culture adequate volume   Culture   Final    NO GROWTH 1 DAY Performed at [facility], [address] [place], [place] [address]    Report Status PENDING  Incomplete  Resp Panel by RT-PCR (Flu A&B, Covid) Nasopharyngeal Swab     Status: None   Collection Time: 03/13/21  5:28 AM   Specimen: Nasopharyngeal Swab; Nasopharyngeal(NP) swabs in vial transport medium  Result Value Ref Range Status   SARS Coronavirus 2 by RT PCR NEGATIVE NEGATIVE Final    Comment: (NOTE) SARS-CoV-2 target nucleic acids are NOT DETECTED.  The SARS-CoV-2 RNA is generally detectable in upper respiratory specimens during the acute phase of infection. The lowest concentration of SARS-CoV-2 viral copies this assay can detect is 138 copies/mL. A negative result does not preclude SARS-Cov-2 infection and should not be used as the sole basis for treatment or other patient management decisions. A negative result may occur with  improper specimen collection/handling, submission of specimen other than nasopharyngeal swab, presence of viral  mutation(s) within the areas targeted by this assay, and inadequate number of viral copies(<138 copies/mL). A negative result must be combined with clinical observations, patient history, and epidemiological information. The expected result is Negative.  Fact Sheet for Patients:  EntrepreneurPulse.com.au  Fact Sheet for Healthcare Providers:  IncredibleEmployment.be  This test is no t yet approved or cleared by the [place] FDA and  has been authorized for detection and/or diagnosis of SARS-CoV-2 by FDA under an Emergency Use Authorization (EUA). This EUA will remain  in effect (meaning this test can be used) for the duration of the COVID-19 declaration under Section 564(b)(1) of the Act, 21 U.S.C.section 360bbb-3(b)(1), unless the authorization is terminated  or revoked sooner.       Influenza A by PCR NEGATIVE NEGATIVE Final   Influenza B by PCR NEGATIVE NEGATIVE Final    Comment: (NOTE) The Xpert Xpress SARS-CoV-2/FLU/RSV plus assay is intended as an aid in the diagnosis of influenza from Nasopharyngeal swab specimens and should not be used as a sole basis for treatment. Nasal washings and aspirates are unacceptable for Xpert Xpress SARS-CoV-2/FLU/RSV testing.  Fact Sheet for Patients: EntrepreneurPulse.com.au  Fact Sheet for Healthcare Providers: IncredibleEmployment.be  This test is not yet approved or cleared by the [place] FDA and has been authorized for detection and/or diagnosis of SARS-CoV-2 by FDA under an Emergency Use Authorization (EUA). This EUA will remain in effect (meaning this test can be used) for the duration of the COVID-19 declaration under Section 564(b)(1) of the Act, 21 U.S.C. section 360bbb-3(b)(1), unless the authorization is terminated or revoked.  Performed at [facility], [address] [place], [place] [address]   MRSA Next Gen by PCR, Nasal      Status: None   Collection Time: 03/13/21  6:58 PM   Specimen: Nasal Mucosa; Nasal Swab  Result Value Ref Range Status   MRSA by PCR Next Gen NOT DETECTED NOT DETECTED Final    Comment: (NOTE) The GeneXpert MRSA Assay (FDA approved for NASAL specimens only), is one component of a comprehensive MRSA colonization surveillance program. It is not intended to diagnose MRSA infection nor to guide or monitor treatment for MRSA infections. Test performance is not FDA approved in patients less than 19 years old. Performed at [facility], [address] [place], [place] [address]          Radiology Studies: CT CHEST WO CONTRAST  Result Date: 03/13/2021 CLINICAL DATA:  Shortness of breath EXAM: CT CHEST WITHOUT CONTRAST TECHNIQUE: Multidetector CT imaging of the chest was performed following the standard protocol without IV contrast. COMPARISON:  02/28/2021 FINDINGS: Cardiovascular: Aneurysmal dilatation of the ascending thoracic aorta again noted measuring up to 4.7 cm, stable. Diffuse aortic atherosclerosis. Cardiomegaly. Diffuse coronary artery calcifications. Mediastinum/Nodes: Stable mildly prominent mediastinal lymph nodes. No axillary or visible hilar adenopathy. Lungs/Pleura: Decreasing right pleural effusion. Residual small right effusion. 2 adjacent nodules in the right upper lobe are again noted measuring 9 mm and 7 mm. Moderate emphysema. Patchy airspace disease throughout the right lung, most confluent in the right lower lobe. Scarring in the lingula and left lower lobe. Upper Abdomen: Imaging into the upper abdomen demonstrates no acute findings. Musculoskeletal: Chest wall soft tissues are unremarkable. No acute bony abnormality. 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 Recommend continued follow-up as noted on previous exams. Small right pleural effusion, decreasing since prior study. Patchy airspace disease throughout the right lung most confluent in the  right lower lobe concerning for pneumonia. Two adjacent right upper lobe nodules. These could be followed with repeat CT in 6 months. Cardiomegaly, coronary artery disease. Aortic Atherosclerosis (ICD10-I70.0) and Emphysema (ICD10-J43.9). Electronically Signed   By: Rolm Baptise M.D.   On: 03/13/2021 08:20   DG Chest Port 1 View  Result Date: 03/13/2021 CLINICAL DATA:  Shortness of breath EXAM: PORTABLE CHEST 1 VIEW COMPARISON:  Film from earlier in the same day. FINDINGS: Cardiac shadow remains enlarged. Lungs are again hyperinflated with diffuse interstitial densities worse on the right than the left. Right-sided effusion is noted as well. The overall appearance is similar to that seen on the prior exam. No new focal abnormality is seen. IMPRESSION: Stable appearance of the chest when compared with earlier in the same day. Electronically Signed   By: Inez Catalina M.D.   On: 03/13/2021 19:24   DG Chest Portable 1 View  Result Date: 03/13/2021 CLINICAL DATA:  Shortness of breath. EXAM: PORTABLE CHEST 1 VIEW COMPARISON:  03/06/2021. FINDINGS: Similar enlargement the cardiac silhouette. Calcific atherosclerosis of the aorta. Ascending thoracic aortic aneurysm better characterized on prior CT chest. Slight increase in a small to moderate right pleural effusion. No visible pneumothorax. Mildly increased diffuse interstitial opacities with patchy airspace opacities. IMPRESSION: 1. Similar cardiomegaly with slight worsening of diffuse interstitial and patchy airspace opacities, most likely pulmonary edema/CHF. Infection not excluded. 2. Slightly increased small to moderate right pleural effusion. Electronically Signed   By: Margaretha Sheffield MD   On: 03/13/2021 06:36        Scheduled Meds:  Chlorhexidine Gluconate Cloth  6 each Topical Daily   digoxin  0.125 mg Oral Daily   furosemide  20 mg Intravenous Daily   mouth rinse  15 mL Mouth Rinse BID   metoprolol tartrate  25 mg Oral BID   pantoprazole  (PROTONIX) IV  40 mg Intravenous Q12H   Continuous Infusions:  ampicillin-sulbactam (UNASYN) IV 3 g (03/14/21 1312)     LOS: 1 day    Time spent: 25 minutes    Sidney Ace, MD [facility] Pager [phone]  If 7PM-7AM, please contact night-coverage 03/14/2021, 3:54 PM

## 2021-03-14 NOTE — Consult Note (Signed)
[facility] NOTE  Patient Care Team: Kirk Ruths, MD as PCP - General (Internal Medicine) Cammie Sickle, MD as Consulting Physician (Internal Medicine)  CHIEF COMPLAINTS/PURPOSE OF CONSULTATION: Severe anemia/anticoagulation for A. fib HISTORY OF PRESENTING ILLNESS:  Samuel Mcknight 80 y.o.  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  Patient was recently discharged in the hospital-however again currently admitted to the hospital for worsening shortness of breath difficulty swallowing.  No fever no chills.  Concern for aspiration while swallowing.  No significant fevers.  Chest x-ray on admission showed-diffuse interstitial/patchy airspace opacity likely pulmonary edema/moderat right-sided pleural effusion/possible aspiration.  Patient was evaluated by pulmonary critical care-clinically admitted to ICU for respiratory support.   Given patient concern for aspiration-patient is currently n.p.o. awaiting evaluation with speech pathology.  Of note patient's hemoglobin is 6.5.  Denies any blood in stools or black or stools.    Review of Systems  Constitutional:  Positive for malaise/fatigue and weight loss. Negative for chills, diaphoresis and fever.  HENT:  Negative for nosebleeds and sore throat.   Eyes:  Negative for double vision.  Respiratory:  Positive for cough, sputum production, shortness of breath and wheezing. Negative for hemoptysis.   Cardiovascular:  Negative for chest pain, palpitations, orthopnea and leg swelling.  Gastrointestinal:  Negative for abdominal pain, blood in stool, constipation, diarrhea, heartburn, melena, nausea and vomiting.  Genitourinary:  Negative for dysuria, frequency and urgency.  Musculoskeletal:   Positive for back pain and joint pain.  Skin: Negative.  Negative for itching and rash.  Neurological:  Positive for dizziness. Negative for tingling, focal weakness, weakness and headaches.  Endo/Heme/Allergies:  Does not bruise/bleed easily.  Psychiatric/Behavioral:  Negative for depression. The patient is not nervous/anxious and does not have insomnia.     MEDICAL HISTORY:  Past Medical History:  Diagnosis Date   Atrial fibrillation (HCC)    GAD (generalized anxiety disorder)    Hypertension    Prediabetes     SURGICAL HISTORY: Past Surgical History:  Procedure Laterality Date   CARDIOVERSION N/A 02/24/2020   Procedure: CARDIOVERSION;  Surgeon: Isaias Cowman, MD;  Location: [facility] ORS;  Service: Cardiovascular;  Laterality: N/A;   supraorbital artery repair  2005   TOOTH EXTRACTION      SOCIAL HISTORY: Social History   Socioeconomic History   Marital status: Married    Spouse name: Not on file   Number of children: Not on file   Years of education: Not on file   Highest education level: Not on file  Occupational History   Not on file  Tobacco Use   Smoking status: Former    Pack years: 0.00    Types: Cigarettes   Smokeless tobacco: Former    Quit date: 2006  Vaping Use   Vaping Use: Former   Quit date: 03/14/2005  Substance and Sexual Activity   Alcohol use: Not Currently    Comment: quit    Drug use: Not Currently   Sexual activity: Not on file  Other Topics Concern   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    Social Determinants of Health   Financial Resource Strain: Not on file  Food Insecurity: Not on file  Transportation Needs: Not on file  Physical Activity: Not on file  Stress: Not on file  Social Connections: Not on file  Intimate Partner Violence: Not on file    FAMILY HISTORY: Family History  Problem Relation Age of Onset   Lung cancer Father      ALLERGIES:  is allergic to bupropion, amiodarone, and diltiazem.  MEDICATIONS:  Current Facility-Administered Medications  Medication Dose Route Frequency Provider Last Rate Last Admin   ALPRAZolam (XANAX) tablet 0.5 mg  0.5 mg Oral BID PRN Agbata, Tochukwu, MD       Ampicillin-Sulbactam (UNASYN) 3 g in sodium chloride 0.9 % 100 mL IVPB  3 g Intravenous Q8H Beers, Shanon Brow, RPH   Stopped at 03/14/21 1356   Chlorhexidine Gluconate Cloth 2 % PADS 6 each  6 each Topical Daily Ottie Glazier, MD   6 each at 03/14/21 1126   digoxin (LANOXIN) tablet 0.125 mg  0.125 mg Oral Daily Agbata, Tochukwu, MD   0.125 mg at 03/13/21 1201   furosemide (LASIX) injection 20 mg  20 mg Intravenous Daily Kolluru, Sarath, MD   20 mg at 03/14/21 1318   levalbuterol (XOPENEX) nebulizer solution 0.63 mg  0.63 mg Nebulization Q8H PRN Ottie Glazier, MD       LORazepam (ATIVAN) injection 1 mg  1 mg Intravenous Q6H PRN Lang Snow, NP       MEDLINE mouth rinse  15 mL Mouth Rinse BID Lanney Gins, Fuad, MD   15 mL at 03/14/21 1101   metoprolol tartrate (LOPRESSOR) injection 2.5 mg  2.5 mg Intravenous Q6H PRN Ottie Glazier, MD       metoprolol tartrate (LOPRESSOR) tablet 25 mg  25 mg Oral BID Agbata, Tochukwu, MD   25 mg at 03/13/21 1200   pantoprazole (PROTONIX) injection 40 mg  40 mg Intravenous Q12H Ottie Glazier, MD          .  PHYSICAL EXAMINATION:  Vitals:   03/14/21 1700 03/14/21 1900  BP: (!) 91/56 (!) 81/52  Pulse: (!) 115 (!) 116  Resp: (!) 29 (!) 26  Temp:    SpO2: 100% 100%   Filed Weights   03/13/21 0517 03/13/21 0850 03/13/21 1900  Weight: 140 lb (63.5 kg) 132 lb (59.9 kg) 122 lb 5.7 oz (55.5 kg)    Physical Exam Vitals and nursing note reviewed.  Constitutional:      Comments: Patient resting in the bed.  Accompanied by: Significant other.  Frail-appearing cachectic.  4 L of oxygen.  Appears pale.  HENT:     Head: Normocephalic and atraumatic.     Mouth/Throat:      Mouth: Mucous membranes are moist.     Pharynx: No oropharyngeal exudate.  Eyes:     Pupils: Pupils are equal, round, and reactive to light.  Cardiovascular:     Rate and Rhythm: Normal rate and regular rhythm.     Comments: Positive for murmur. Pulmonary:     Effort: No respiratory distress.     Breath sounds: No wheezing.     Comments: Decreased breath sounds bilaterally at bases.  No wheeze or crackles Abdominal:     General: Bowel sounds are normal. There is no distension.     Palpations: Abdomen is soft. There is no mass.     Tenderness: There is no abdominal tenderness. There is no guarding or rebound.  Musculoskeletal:        General: No tenderness. Normal range of motion.     Cervical back: Normal range of motion and neck supple.  Skin:    General: Skin is warm.  Neurological:     Mental Status: He is alert and oriented to person, place, and time.  Psychiatric:        Mood and Affect: Affect normal.        Judgment: Judgment normal.     LABORATORY DATA:  I have reviewed the data as listed Lab Results  Component Value Date   WBC 16.3 (H) 03/14/2021   HGB 6.5 (L) 03/14/2021   HCT 19.7 (L) 03/14/2021   MCV 100.5 (H) 03/14/2021   PLT 294 03/14/2021   Recent Labs    02/28/21 0101 03/01/21 0608 03/08/21 0518 03/13/21 0527 03/14/21 0622  NA 136   < > 135 136 138  K 4.8   < > 4.1 5.1 4.6  CL 102   < > 99 98 100  CO2 27   < > 31 29 28   GLUCOSE 962*   < > 88 110* 104*  BUN 36*   < > 36* 41* 40*  CREATININE 1.59*   < > 1.51* 1.60* 1.60*  CALCIUM 9.2   < > 8.7* 9.0 8.8*  GFRNONAA 44*   < > 46* 43* 43*  PROT 8.5*  --   --  8.4*  --   ALBUMIN 3.8  --   --  3.3*  --   AST 33  --   --  41  --   ALT 17  --   --  26  --   ALKPHOS 95  --   --  108  --   BILITOT 1.1  --   --  2.0*  --   BILIDIR 0.3*  --   --   --   --   IBILI 0.8  --   --   --   --    < > = values in this interval not displayed.    RADIOGRAPHIC STUDIES: I have personally reviewed the radiological  images as listed and agreed with the findings in the report. DG Chest 2 View  Result Date: 03/06/2021 CLINICAL DATA:  Shortness of breath EXAM: CHEST - 2 VIEW COMPARISON:  03/01/2021 FINDINGS: Unchanged cardiomegaly. Mild interval worsening of pulmonary vascular congestion. Small right pleural effusion is increased since prior examination. Scattered patchy pulmonary opacities are most likely due to pulmonary edema. IMPRESSION: Mild interval worsening of findings of CHF/fluid volume overload. Small right pleural effusion is mildly increased in size since prior exam. Electronically Signed   By: Miachel Roux M.D.   On: 03/06/2021 14:51   CT CHEST WO CONTRAST  Result Date: 03/13/2021 CLINICAL DATA:  Shortness of breath EXAM: CT CHEST WITHOUT CONTRAST TECHNIQUE: Multidetector CT imaging of the chest was performed following the standard protocol without IV contrast. COMPARISON:  02/28/2021 FINDINGS: Cardiovascular: Aneurysmal dilatation of the ascending thoracic aorta again noted measuring up to 4.7 cm, stable. Diffuse aortic atherosclerosis. Cardiomegaly. Diffuse coronary artery calcifications. Mediastinum/Nodes: Stable mildly prominent mediastinal lymph nodes. No axillary or visible hilar adenopathy. Lungs/Pleura: Decreasing right pleural effusion. Residual small right effusion. 2 adjacent nodules in the right upper lobe are again noted measuring 9 mm and 7 mm. Moderate emphysema. Patchy airspace disease throughout the right lung, most confluent in the right lower lobe. Scarring in the lingula and left lower lobe. Upper Abdomen: Imaging into the upper abdomen demonstrates no acute findings. Musculoskeletal: Chest wall soft tissues are unremarkable. No acute bony abnormality. 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 Recommend continued follow-up as noted on  previous exams. Small right pleural effusion, decreasing since prior study. Patchy airspace disease throughout the right lung most  confluent in the right lower lobe concerning for pneumonia. Two adjacent right upper lobe nodules. These could be followed with repeat CT in 6 months. Cardiomegaly, coronary artery disease. Aortic Atherosclerosis (ICD10-I70.0) and Emphysema (ICD10-J43.9). Electronically Signed   By: Rolm Baptise M.D.   On: 03/13/2021 08:20   CT Angio Chest PE W and/or Wo Contrast  Result Date: 02/28/2021 CLINICAL DATA:  Shortness of breath EXAM: CT ANGIOGRAPHY CHEST WITH CONTRAST TECHNIQUE: Multidetector CT imaging of the chest was performed using the standard protocol during bolus administration of intravenous contrast. Multiplanar CT image reconstructions and MIPs were obtained to evaluate the vascular anatomy. CONTRAST:  81mL OMNIPAQUE IOHEXOL 350 MG/ML SOLN COMPARISON:  11/07/2020 FINDINGS: Cardiovascular: No filling defects in the pulmonary arteries to suggest pulmonary emboli. Cardiomegaly. Aneurysmal dilatation of the ascending thoracic aorta measures 4.7 cm. Mildly dilated main pulmonary artery measures 4.1 cm. Coronary artery and aortic calcifications. Mediastinum/Nodes: No mediastinal, hilar, or axillary adenopathy. Trachea and esophagus are unremarkable. Thyroid unremarkable. Lungs/Pleura: Mild emphysema. Large right pleural effusion and small left pleural effusion. Airspace disease in the right lower lobe and right middle lobe could reflect atelectasis or pneumonia. Patchy ground-glass peripheral opacities in the left lower lobe. Upper Abdomen: Imaging into the upper abdomen demonstrates no acute findings. Musculoskeletal: Chest wall soft tissues are unremarkable. No acute bony abnormality. Review of the MIP images confirms the above findings. IMPRESSION: No evidence of pulmonary embolus. Large right pleural effusion and small left pleural effusion. Airspace disease in the right lower lobe and right middle lobe could reflect atelectasis or pneumonia. Patchy opacities in the left lower lobe concerning for early  pneumonia. 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 Recommend semi-annual imaging followup by CTA or MRA and referral to cardiothoracic surgery if not already obtained. This recommendation follows 2010 ACCF/AHA/AATS/ACR/ASA/SCA/SCAI/SIR/STS/SVM Guidelines for the Diagnosis and Management of Patients With Thoracic Aortic Disease. Circulation. 2010; 121: K932-I712. Aortic aneurysm NOS (ICD10-I71.9) Aortic Atherosclerosis (ICD10-I70.0) and Emphysema (ICD10-J43.9). Electronically Signed   By: Rolm Baptise M.D.   On: 02/28/2021 02:52   DG Chest Port 1 View  Result Date: 03/13/2021 CLINICAL DATA:  Shortness of breath EXAM: PORTABLE CHEST 1 VIEW COMPARISON:  Film from earlier in the same day. FINDINGS: Cardiac shadow remains enlarged. Lungs are again hyperinflated with diffuse interstitial densities worse on the right than the left. Right-sided effusion is noted as well. The overall appearance is similar to that seen on the prior exam. No new focal abnormality is seen. IMPRESSION: Stable appearance of the chest when compared with earlier in the same day. Electronically Signed   By: Inez Catalina M.D.   On: 03/13/2021 19:24   DG Chest Portable 1 View  Result Date: 03/13/2021 CLINICAL DATA:  Shortness of breath. EXAM: PORTABLE CHEST 1 VIEW COMPARISON:  03/06/2021. FINDINGS: Similar enlargement the cardiac silhouette. Calcific atherosclerosis of the aorta. Ascending thoracic aortic aneurysm better characterized on prior CT chest. Slight increase in a small to moderate right pleural effusion. No visible pneumothorax. Mildly increased diffuse interstitial opacities with patchy airspace opacities. IMPRESSION: 1. Similar cardiomegaly with slight worsening of diffuse interstitial and patchy airspace opacities, most likely pulmonary edema/CHF. Infection not excluded. 2. Slightly increased small to moderate right pleural effusion. Electronically  Signed   By: Margaretha Sheffield MD   On: 03/13/2021 06:36   DG Chest Port 1 View  Result  Date: 03/01/2021 CLINICAL DATA:  Status post right thoracentesis. EXAM: PORTABLE CHEST 1 VIEW COMPARISON:  02/28/2021 FINDINGS: Stable cardiac enlargement. Significant diminishment in right pleural fluid volume after thoracentesis. There may be a tiny amount of pleural air at the lateral right lung base. Improved aeration of the right lower lung. Slight decrease in pulmonary edema. IMPRESSION: Decrease in right pleural fluid volume after thoracentesis with improved aeration of the right lung. Tiny amount of pleural air present adjacent to the lung at the right lung base. Slight decrease in pulmonary edema. Electronically Signed   By: Aletta Edouard M.D.   On: 03/01/2021 17:14   DG Chest Portable 1 View  Result Date: 02/28/2021 CLINICAL DATA:  Shortness of breath EXAM: PORTABLE CHEST 1 VIEW COMPARISON:  10/27/2020 FINDINGS: Small right pleural effusion with basilar atelectasis. Mild cardiomegaly. Right-greater-than-left mild interstitial opacity. IMPRESSION: Cardiomegaly, small right pleural effusion and mild pulmonary edema. Electronically Signed   By: Ulyses Jarred M.D.   On: 02/28/2021 01:20   US THORACENTESIS ASP PLEURAL SPACE W/IMG GUIDE  Result Date: 03/01/2021 CLINICAL DATA:  Shortness of breath and moderate to large right pleural effusion. EXAM: ULTRASOUND GUIDED RIGHT THORACENTESIS COMPARISON:  None. PROCEDURE: An ultrasound guided thoracentesis was thoroughly discussed with the patient and questions answered. The benefits, risks, alternatives and complications were also discussed. The patient understands and wishes to proceed with the procedure. Written consent was obtained. Ultrasound was performed to localize and mark an adequate pocket of fluid in the right chest. The area was then prepped and draped in the normal sterile fashion. 1% Lidocaine was used for local anesthesia. Under ultrasound guidance a 6  French Safe-T-Centesis catheter was introduced. Thoracentesis was performed. The catheter was removed and a dressing applied. COMPLICATIONS: None FINDINGS: A total of approximately 1.9 L of amber fluid was removed. A fluid sample was sent for laboratory analysis. IMPRESSION: Successful ultrasound guided right thoracentesis yielding 1.9 L of pleural fluid. Electronically Signed   By: Aletta Edouard M.D.   On: 03/01/2021 17:12    Anemia of chronic kidney failure, stage 3 (moderate) #80 year old male patient with multiple medical problems including anemia/CKD severe aortic stenosis/A. 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  However do not suspect any GI bleed/acute blood loss.  Recommend 2 units of PRBC blood transfusion.  #Anticoagulation for A. Fib-patient on Coumadin/however given n.p.o. status/and given poor expected nutrition-it will be difficult to monitor INR.  Again oral novel anticoagulants contraindicated given his difficulty swallowing/risk of aspiration.  IV heparin would be recommended to cover for risk of A. Fib/however given severe anemia-and the fact the patient does not have any acute clotting is reasonable to hold off IV heparin.  Patient could be considered for NOAKs if at discharge prophylactic anticoagulation is recommended.   #Chronic ITP-stable.  [on Coumadin]-s/p Promacta 50mg  QOD.  Platelets 294.  Stable continue for now.  #CHF/severe aortic stenosis-right pleural effusion needing thoracentesis x2 [Jan 2022]-overall contributing to patient's poor prognosis  #Acute on chronic respiratory failure needing 4 L of oxygen currently-s/p aspiration.  On antibiotics.  Work-up in progress.  Thank you Dr.Aleskerov for allowing me to participate in  the care of your pleasant patient. Please do not hesitate to contact me with questions or concerns in the interim.  Above plan of care was discussed with the patient/significant other.  Also  discussed with Dr.Sreenath and Dr.Alsekerov.   All questions were answered. The patient knows to call the clinic with any problems, questions or concerns.    Cammie Sickle, MD 03/14/2021 7:47 PM

## 2021-03-14 NOTE — Progress Notes (Addendum)
[place] for Warfarin  Indication: atrial fibrillation  Allergies  Allergen Reactions   Bupropion Swelling   Amiodarone Rash    Broke out in dark blue blotches   Diltiazem Rash    Vital Signs: Temp: 97.6 F (36.4 C) (07/13 0730) Temp Source: Oral (07/13 0730) BP: 87/53 (07/13 0800) Pulse Rate: 90 (07/13 0800)  Labs: Recent Labs    03/13/21 0527 03/14/21 0622  HGB 8.0* 6.5*  HCT 24.0* 19.7*  PLT 361 294  LABPROT 25.5* 29.8*  INR 2.3* 2.8*  CREATININE 1.60* 1.60*  TROPONINIHS 21*  --     Estimated Creatinine Clearance: 28.9 mL/min (A) (by C-G formula based on SCr of 1.6 mg/dL (H)).   Medical History: Past Medical History:  Diagnosis Date   Atrial fibrillation (HCC)    GAD (generalized anxiety disorder)    Hypertension    Prediabetes     Medications/Assessment: Pharmacy consulted to dose warfarin for Afib in this 80 year old male presenting with SOB.     Pt was on Warfarin 2 mg PO daily, last dose 7/11 AND warfarin 2.5 mg PO QOD, last dose 7/10 (pt takes 2 mg along with 2.5 mg every other day to make dose of 4.5 mg).   Date INR Warfarin dose 7/5   2.7  4.5 mg 7/6   2.3 2 mg 7/7 2.7 4.5 mg  --- 7/11 Unk 2 mg PTA  7/12 2.3 4.5 mg (not given) 7/13 2.8 NPO  Goal of Therapy:  INR 2-3  Plan:  --No warfarin tonight. Patient is NPO, failed swallow evaluation --INR therapeutic today and trending up despite dose not being administered yesterday. May be explained by new antibiotics, hepatic congestion, poor oral intake --Hgb down to 6.5 today, blood transfusion ordered --Continue to monitor for s/sx of bleeding / reversal of warfarin if indicated --Continue to follow anticoagulation plan. If continued, may need to start parenteral anticoagulation if enteral access not established. Currently therapeutic so no need at this time  Benita Gutter 03/14/2021 8:51 AM

## 2021-03-14 NOTE — Progress Notes (Signed)
Speech Language Pathology Treatment: Dysphagia  Patient Details Name: Becket Wecker MRN: [id number] DOB: 1941-01-20 Today's Date: 03/14/2021 Time: 1650-1750 SLP Time Calculation (min) (ACUTE ONLY): 60 min  Assessment / Plan / Recommendation Clinical Impression  Pt seen for ongoing education re: swallow function post MBSS yesterday. Pt has also been consulted by Palliative Care services re: GOC d/t Baseline medical status (nonrheumatic aortic valve stenosis, severe aortic stenosis, shortness of breath, chronic respiratory failure requiring oxygen supplementation at baseline) and newly dx'd Pharyngeal phase Dysphagia seen on the MBSS. After meeting w/ Palliative Care today, pt has decided to medically optimize and D/C home with [facility]. Patient wants to drink liquids. He is aware of the risk. He does not want a PEG tube.  Much education was had re: results of the MBSS; aspiration precautions; swallowing strategies; model and practice using the swallowing strategies; food consistencies; ordering information re: thickening products/agents; food preparation and options. Pt practiced po trials of Honey consistency liquids VIA TSP using swallowing strategies of Chin Tuck and f/u, Dry swallow to reduce/clear pharyngeal residue b/t swallows. Pt was able to use all strategies independently; Wife present recalled all strategies and assisted pt in follow through w/ strategies giving light verbal cues. Pt consumed trials of Honey liquids, purees(4 ozs of magic cup), and minced food(x1) w/ no immediate, overt coughing or discomfort, no decline in status overall throughout all oral intake. Pt stated he felt "good" about the oral intake and was "glad" to have some. He actually seemed energetic and smiled often at the end of the meal/session.  Further discussion w/ pt and Wife answering general questions they had. Handouts given on the education/ordering of products.  Per decisions w/ MD and Palliative Care  today, pt is going to proceed w/ an oral, Dysphagia diet. Diet: Dysphagia level 2 (minced w/ gravies), Honey liquids VIA TSP. Chin Tuck w/ ALL swallows; f/u, DRY swallows. Aspiration precautions. Pt is able to feed self.  ST services will be available for further education, information. Will give thickened liquids and products for D/C.         HPI HPI: Pt is a 80 y.o. 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  He states that his symptoms are usually worse at night and he is unable to lay flat in bed.  He is having to sleep sitting upright.  He denies having any cough but states that sometimes when he drinks water he starts coughing.  He denies having any fever or chills but had a low-grade temp upon arrival to the ER.  He denies having any chest pain, no nausea, no vomiting, no abdominal pain, no dizziness, no lightheadedness, no headache, no blurred vision, no changes in his bowel habits, no lower extremity swelling, no focal deficits.  Chest CT: "right pleural effusion. 2 adjacent nodules in the right upper lobe are again  noted measuring 9 mm and 7 mm. Moderate emphysema. Patchy airspace disease throughout the right lung, most confluent in the right lower  lobe. Scarring in the lingula and left lower lobe".  Of note, per chart notes and pt/Wife report, pt has had several dxs of Pneumonia and 4 thoracentesis procedures in recent ~6 months.  MBSS completed this admit revealing Severe Pharyngeal phase Dysphagia w/ frank aspiration of  thin and Nectar liquids; aspiration of pharyngeal residue in between trials. Palliative Care following pt.      SLP Plan  Continue with current plan of care (education)       Recommendations  Diet  recommendations: Dysphagia 2 (fine chop);Honey-thick liquid Liquids provided via: Teaspoon Medication Administration: Whole meds with puree (Crush if needed for ease of swallow) Supervision: Patient able to self feed Compensations: Minimize environmental distractions;Slow rate;Small sips/bites;Lingual sweep for clearance of pocketing;Multiple dry swallows after each bite/sip;Clear throat intermittently;Chin tuck Postural Changes and/or Swallow Maneuvers: Out of bed for meals;Seated upright 90 degrees;Upright 30-60 min after meal                General recommendations:  (TBD; Palliative/Hospice services) Oral Care Recommendations: Oral care BID;Oral care before and after PO;Patient independent with oral care Follow up Recommendations:  (TBD) SLP Visit Diagnosis: Dysphagia, pharyngeal phase (R13.13) Plan: Continue with current plan of care (education)       GO                 [name], MS, CCC-SLP Speech Language Pathologist Rehab Services [phone] [facility] 03/14/2021, 6:46 PM

## 2021-03-14 NOTE — Assessment & Plan Note (Addendum)
#  79 year old male patient with multiple medical problems including anemia/CKD severe aortic stenosis/A. fib on Coumadin/is currently admitted to hospital for difficulty swallowing/concern for aspiration pneumonia  #Severe anemia secondary to CKD/iron deficiency-on outpatient Retacrit/ IV Venofer-status post PRBC transfusion .  #Anticoagulation for A. Fib-patient on Coumadin-currently Coumadin discontinued  #Chronic ITP-stable.  [on Coumadin]-Promacta could be discontinued-see below  #Acute on chronic respiratory failure -CHF/severe aortic stenosis/recurrent right pleural effusion/severe COPD-see below  #Given overall poor cardiorespiratory status/declining quality of life/poor performance status-patient/significant other is interested in hospice-as they are aware that further medical intervention is not going to offer them significant quality of life.  Hospice has met with the patient/significant other.  Plan is to discharge home on hospice this afternoon.  Discussed with patient and his girlfriend regarding minimal contribution of continued Retacrit injections in the context of his overall poor prognosis.  Retacrit/IV iron infusions will be discontinued-they are in agreement.

## 2021-03-14 NOTE — Consult Note (Addendum)
Consultation Note Date: 03/14/2021   Patient Name: Samuel Mcknight  DOB: 06-12-1941  MRN: [id number]  Age / Sex: 80 y.o., male  PCP: Kirk Ruths, MD Referring Physician: Sidney Ace, MD  Reason for Consultation: Establishing goals of care  HPI/Patient Profile:  Samuel Mcknight is a 80 y.o. male with medical history significant for MGUS, atrial fibrillation, hypertension, CKD 3A, on digoxin, anxiety, bicuspid aortic valve, nonrheumatic aortic valve stenosis, severe aortic stenosis, shortness of breath, chronic respiratory failure requiring oxygen supplementation at baseline, exertional shortness of breath, presents emergency department for chief concerns of hypoxia.    Clinical Assessment and Goals of Care: Patient is resting in bed with wife sitting in bed with him. He and his significant other have been together for 10 years. Patient has children from previous marriage.   He discusses his decline since mid 2021. He has had increasing SOB, and multiple thoracentesis procedures to remove fluid. He states a TAVR or open heart valve repair was discussed previously but he was advised not to proceed with any interventions for his valve. His QOL has been very poor.   We discussed his diagnoses, prognosis, GOC, EOL wishes disposition and options. He is well aware of his chronic health conditions and articulates them and their prognosis very well.   Created space and opportunity for patient  to explore thoughts and feelings regarding current medical information.   A detailed discussion was had today regarding advanced directives.  Concepts specific to code status, artifical feeding and hydration, IV antibiotics and rehospitalization were discussed.  The difference between an aggressive medical intervention path and a comfort care path was discussed.  Values and goals of care important to patient and family  were attempted to be elicited.  Discussed limitations of medical interventions to prolong quality of life in some situations and discussed the concept of human mortality.  After much conversation, and discussion of multiple scenarios, patient stated he would not want a feeding tube. He would like to try safe swallowing interventions. He would like to be medically optimized and D/C home with [facility] hospice. He would like to focus on comfort until end of life. He would like the ability to go to hospice facility if his symptoms are not well managed at home prior to end of life.    REQUEST MADE FOR [facility] TO COMPLETE HPOA PAPERS FOR PATIENT AS HE WOULD LIKE HIS SIGNIFICANT OTHER TO BE HIS PRIMARY DECISION MAKER.   I completed a MOST form today and the signed original was placed in the chart. A photocopy was placed in the chart to be scanned into EMR. The patient outlined their wishes for the following treatment decisions:  Cardiopulmonary Resuscitation: Do Not Attempt Resuscitation (DNR/No CPR)  Medical Interventions: Limited Additional Interventions: Use medical treatment, IV fluids and cardiac monitoring as indicated, DO NOT USE intubation or mechanical ventilation. May consider use of less invasive airway support such as BiPAP or CPAP. Also provide comfort measures. Transfer to the hospital if indicated. Avoid intensive care.  Antibiotics: Left Blank  IV Fluids: Left Blank  Feeding Tube: No feeding tube       SUMMARY OF RECOMMENDATIONS   Medically optimize and D/C home with [facility] hospice. Patient wants to drink liquids. He is aware of the risk.       Primary Diagnoses: Present on Admission:  Lobar pneumonia ([place])  Chronic diastolic CHF (congestive heart failure) (HCC)  Anemia of chronic kidney failure, stage 3 (moderate)  Atrial fibrillation, chronic (HCC)  Essential hypertension  Chronic respiratory failure (HCC)  Acute on chronic respiratory failure (HCC)  Dysphagia,  oropharyngeal phase   I have reviewed the medical record, interviewed the patient and family, and examined the patient. The following aspects are pertinent.  Past Medical History:  Diagnosis Date   Atrial fibrillation (HCC)    GAD (generalized anxiety disorder)    Hypertension    Prediabetes    Social History   Socioeconomic History   Marital status: Married    Spouse name: Not on file   Number of children: Not on file   Years of education: Not on file   Highest education level: Not on file  Occupational History   Not on file  Tobacco Use   Smoking status: Former    Pack years: 0.00    Types: Cigarettes   Smokeless tobacco: Former    Quit date: 2006  Vaping Use   Vaping Use: Former   Quit date: 03/14/2005  Substance and Sexual Activity   Alcohol use: Not Currently    Comment: quit    Drug use: Not Currently   Sexual activity: Not on file  Other Topics Concern   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    Social Determinants of Health   Financial Resource Strain: Not on file  Food Insecurity: Not on file  Transportation Needs: Not on file  Physical Activity: Not on file  Stress: Not on file  Social Connections: Not on file   Family History  Problem Relation Age of Onset   Lung cancer Father    Scheduled Meds:  Chlorhexidine Gluconate Cloth  6 each Topical Daily   digoxin  0.125 mg Oral Daily   furosemide  20 mg Intravenous Daily   mouth rinse  15 mL Mouth Rinse BID   metoprolol tartrate  25 mg Oral BID   pantoprazole (PROTONIX) IV  40 mg Intravenous Q12H   Continuous Infusions:  ampicillin-sulbactam (UNASYN) IV 3 g (03/14/21 1312)   PRN Meds:.ALPRAZolam, levalbuterol, LORazepam, metoprolol tartrate Medications Prior to Admission:  Prior to Admission medications   Medication Sig Start Date End Date Taking? Authorizing Provider  ALPRAZolam Duanne Moron) 0.5 MG tablet Take 1 mg  by mouth 2 (two) times daily as needed for anxiety. 02/11/19  Yes [provider]  digoxin (LANOXIN) 0.125 MG tablet Take 0.125 mg by mouth daily. 03/09/20 03/13/21 Yes [provider]  eltrombopag (PROMACTA) 25 MG tablet Take 1 tablet (25 mg total) by mouth daily. Take on an empty stomach 1 hour before a meal or 2 hours after 10/10/20  Yes Cammie Sickle, MD  furosemide (LASIX) 40 MG tablet Take 1 tablet (40 mg total) by mouth daily. Increase from 20 mg daily. 03/08/21 06/06/21 Yes Enzo Bi, MD  metoprolol tartrate (LOPRESSOR) 25 MG tablet Take 1 tablet (25 mg total) by mouth 2 (two) times daily. 03/05/21 04/04/21 Yes Wyvonnia Dusky, MD  warfarin (COUMADIN) 2  MG tablet Take 2 mg by mouth daily. 08/07/20  Yes [provider]  warfarin (COUMADIN) 2.5 MG tablet Take 2.5 mg by mouth every other day. 02/07/21  Yes [provider]   Allergies  Allergen Reactions   Bupropion Swelling   Amiodarone Rash    Broke out in dark blue blotches   Diltiazem Rash   Review of Systems  All other systems reviewed and are negative.  Physical Exam Pulmonary:     Effort: Pulmonary effort is normal.  Neurological:     Mental Status: He is alert.    Vital Signs: BP (!) 110/57   Pulse (!) 46   Temp 97.9 F (36.6 C) (Oral)   Resp (!) 26   Ht 5\' 7"  (1.702 m)   Wt 55.5 kg   SpO2 100%   BMI 19.16 kg/m  Pain Scale: 0-10   Pain Score: 0-No pain   SpO2: SpO2: 100 % O2 Device:SpO2: 100 % O2 Flow Rate: .O2 Flow Rate (L/min): 4 L/min  IO: Intake/output summary:  Intake/Output Summary (Last 24 hours) at 03/14/2021 1529 Last data filed at 03/14/2021 1312 Gross per 24 hour  Intake 618.14 ml  Output 150 ml  Net 468.14 ml    LBM: Last BM Date: 03/14/21 Baseline Weight: Weight: 63.5 kg Most recent weight: Weight: 55.5 kg        Time In: 2:35 Time Out: 3:45 Time Total: 70 min Greater than 50%  of this time was spent counseling and coordinating care related to the  above assessment and plan.  Signed by: Asencion Gowda, NP   Please contact Palliative Medicine Team phone at [phone] for questions and concerns.  For individual provider: See Shea Evans

## 2021-03-14 NOTE — Consult Note (Signed)
CRITICAL CARE PROGRESS NOTE    Name: Samuel Mcknight MRN: [id number] DOB: 07/04/1941     LOS: 1 Referring physician: Dr. Francine Graven  SUBJECTIVE FINDINGS & SIGNIFICANT EVENTS    Patient description:  Is a 80 year old male with a history of permanent atrial fibrillation, advanced COPD with chronic hypoxemia between 3 to 5 L supplemental oxygen at home.  Has chronic anxiety disorder essential hypertension, recently seen in the hospital for acute on chronic hypoxemic respiratory failure with orthopnea.  On admission his labs showed renal insufficiency with creatinine of 1.6 appears to be acute on chronic mild transaminitis and elevation of BNP over 1100 suggestive of recurrence of his CHF.  He was tested for COVID-19 and is negative.  He has interstitial edema bilaterally on his x-ray.  CT chest was done showing chronic a sending thoracic aortic aneurysm at 4.7 cm and a right pleural effusion of minimal size possible infiltrate on the right base and emphysematous lungs.  Telemetry shows accelerated atrial fibrillation.  Patient admitted to stepdown with TRH ,PCCM consultation placed for respiratory distress with A. fib RVR with increased O2 requirement at 10 L/min supplemental oxygen.  03/14/21- patient is with low Hb for transfusion today. S/p eval by heme/onc and cardio.  Patient is unable to take any p.o., met with palliative and wishes to be hospice upon discharge.  Lines/tubes :   Microbiology/Sepsis markers: Results for orders placed or performed during the hospital encounter of 03/13/21  Blood culture (routine x 2)     Status: None (Preliminary result)   Collection Time: 03/13/21  5:27 AM   Specimen: BLOOD  Result Value Ref Range Status   Specimen Description BLOOD LEFT ARM  Final   Special Requests   Final    BOTTLES  DRAWN AEROBIC AND ANAEROBIC Blood Culture adequate volume   Culture   Final    NO GROWTH 1 DAY Performed at [facility], [address], [place], [place] [address]    Report Status PENDING  Incomplete  Blood culture (routine x 2)     Status: None (Preliminary result)   Collection Time: 03/13/21  5:27 AM   Specimen: BLOOD  Result Value Ref Range Status   Specimen Description BLOOD LEFT ARM  Final   Special Requests   Final    BOTTLES DRAWN AEROBIC AND ANAEROBIC Blood Culture adequate volume   Culture   Final    NO GROWTH 1 DAY Performed at [facility], [address] [place],  [address]    Report Status PENDING  Incomplete  Resp Panel by RT-PCR (Flu A&B, Covid) Nasopharyngeal Swab     Status: None   Collection Time: 03/13/21  5:28 AM   Specimen: Nasopharyngeal Swab; Nasopharyngeal(NP) swabs in vial transport medium  Result Value Ref Range Status   SARS Coronavirus 2 by RT PCR NEGATIVE NEGATIVE Final    Comment: (NOTE) SARS-CoV-2 target nucleic acids are NOT DETECTED.  The SARS-CoV-2 RNA is generally detectable in upper respiratory specimens during the acute phase of infection. The lowest concentration of SARS-CoV-2 viral copies this assay can detect is 138 copies/mL. A negative result does not preclude SARS-Cov-2 infection and should not be used as the sole basis for treatment or other patient management decisions. A negative result may occur with  improper specimen collection/handling, submission of specimen other than nasopharyngeal swab, presence of viral mutation(s) within the areas targeted by this assay, and inadequate number of viral copies(<138 copies/mL). A negative result must be combined with clinical observations,  patient history, and epidemiological information. The expected result is Negative.  Fact Sheet for Patients:  EntrepreneurPulse.com.au  Fact Sheet for Healthcare Providers:   IncredibleEmployment.be  This test is no t yet approved or cleared by the [place] FDA and  has been authorized for detection and/or diagnosis of SARS-CoV-2 by FDA under an Emergency Use Authorization (EUA). This EUA will remain  in effect (meaning this test can be used) for the duration of the COVID-19 declaration under Section 564(b)(1) of the Act, 21 U.S.C.section 360bbb-3(b)(1), unless the authorization is terminated  or revoked sooner.       Influenza A by PCR NEGATIVE NEGATIVE Final   Influenza B by PCR NEGATIVE NEGATIVE Final    Comment: (NOTE) The Xpert Xpress SARS-CoV-2/FLU/RSV plus assay is intended as an aid in the diagnosis of influenza from Nasopharyngeal swab specimens and should not be used as a sole basis for treatment. Nasal washings and aspirates are unacceptable for Xpert Xpress SARS-CoV-2/FLU/RSV testing.  Fact Sheet for Patients: EntrepreneurPulse.com.au  Fact Sheet for Healthcare Providers: IncredibleEmployment.be  This test is not yet approved or cleared by the [place] FDA and has been authorized for detection and/or diagnosis of SARS-CoV-2 by FDA under an Emergency Use Authorization (EUA). This EUA will remain in effect (meaning this test can be used) for the duration of the COVID-19 declaration under Section 564(b)(1) of the Act, 21 U.S.C. section 360bbb-3(b)(1), unless the authorization is terminated or revoked.  Performed at [facility], [address] [place], [place] [address]   MRSA Next Gen by PCR, Nasal     Status: None   Collection Time: 03/13/21  6:58 PM   Specimen: Nasal Mucosa; Nasal Swab  Result Value Ref Range Status   MRSA by PCR Next Gen NOT DETECTED NOT DETECTED Final    Comment: (NOTE) The GeneXpert MRSA Assay (FDA approved for NASAL specimens only), is one component of a comprehensive MRSA colonization surveillance program. It is not intended to  diagnose MRSA infection nor to guide or monitor treatment for MRSA infections. Test performance is not FDA approved in patients less than 54 years old. Performed at [facility], [address], [place], [place] [address]     Anti-infectives:  Anti-infectives (From admission, onward)    Start     Dose/Rate Route Frequency Ordered Stop   03/13/21 1030  Ampicillin-Sulbactam (UNASYN) 3 g in sodium chloride 0.9 % 100 mL IVPB        3 g 200 mL/hr over 30 Minutes Intravenous Every 8 hours 03/13/21 0938     03/13/21 0715  ceFEPIme (MAXIPIME) 1 g in sodium chloride 0.9 % 100 mL IVPB        1 g 200 mL/hr over 30 Minutes Intravenous  Once 03/13/21 0706 03/13/21 1126   03/13/21 0715  vancomycin (VANCOCIN) IVPB 1000 mg/200 mL premix        1,000 mg 200 mL/hr over 60 Minutes Intravenous  Once 03/13/21 2800 03/13/21 1433        Consults: Treatment Team:  Ottie Glazier, MD     Tests / Events: Transthoracic echo done on 12/2020-severe aortic stenosis with regurgitation, LVEF 55%    PAST MEDICAL HISTORY   Past Medical History:  Diagnosis Date   Atrial fibrillation ([place])    GAD (generalized anxiety disorder)    Hypertension    Prediabetes      SURGICAL HISTORY   Past Surgical History:  Procedure Laterality Date   CARDIOVERSION N/A 02/24/2020   Procedure: CARDIOVERSION;  Surgeon: Isaias Cowman, MD;  Location: [facility] ORS;  Service: Cardiovascular;  Laterality: N/A;   supraorbital artery repair  2005   TOOTH EXTRACTION       FAMILY HISTORY   Family History  Problem Relation Age of Onset   Lung cancer Father      SOCIAL HISTORY   Social History   Tobacco Use   Smoking status: Former    Pack years: 0.00    Types: Cigarettes   Smokeless tobacco: Former    Quit date: 2006  Vaping Use   Vaping Use: Former   Quit date: 03/14/2005  Substance Use Topics   Alcohol use: Not Currently    Comment: quit    Drug use: Not Currently     MEDICATIONS    Current Medication:  Current Facility-Administered Medications:    ALPRAZolam (XANAX) tablet 0.5 mg, 0.5 mg, Oral, BID PRN, Agbata, Tochukwu, MD   Ampicillin-Sulbactam (UNASYN) 3 g in sodium chloride 0.9 % 100 mL IVPB, 3 g, Intravenous, Q8H, Beers, Shanon Brow, RPH, Stopped at 03/14/21 0034   Chlorhexidine Gluconate Cloth 2 % PADS 6 each, 6 each, Topical, Daily, Ottie Glazier, MD, 6 each at 03/13/21 1903   digoxin (LANOXIN) tablet 0.125 mg, 0.125 mg, Oral, Daily, Agbata, Tochukwu, MD, 0.125 mg at 03/13/21 1201   furosemide (LASIX) injection 40 mg, 40 mg, Intravenous, Once, Alfred Levins, [place], MD   furosemide (LASIX) tablet 40 mg, 40 mg, Oral, Daily, Agbata, Tochukwu, MD   levalbuterol (XOPENEX) nebulizer solution 0.63 mg, 0.63 mg, Nebulization, Q8H PRN, Lanney Gins, Fuad, MD   LORazepam (ATIVAN) injection 1 mg, 1 mg, Intravenous, Q6H PRN, Stark Klein, Bing Neighbors, NP   MEDLINE mouth rinse, 15 mL, Mouth Rinse, BID, Lanney Gins, Fuad, MD   metoprolol tartrate (LOPRESSOR) injection 2.5 mg, 2.5 mg, Intravenous, Once, [place], Bing Neighbors, NP   metoprolol tartrate (LOPRESSOR) tablet 25 mg, 25 mg, Oral, BID, Agbata, Tochukwu, MD, 25 mg at 03/13/21 1200   warfarin (COUMADIN) tablet 2 mg, 2 mg, Oral, q1600 **AND** warfarin (COUMADIN) tablet 2.5 mg, 2.5 mg, Oral, Q48H, Beers, Shanon Brow, RPH   Warfarin - Pharmacist Dosing Inpatient, , Does not apply, q1600, Beers, Shanon Brow, RPH    ALLERGIES   Bupropion, Amiodarone, and Diltiazem    REVIEW OF SYSTEMS     10 point reviews of system conducted and is negative except for shortness of breath and chest discomfort  PHYSICAL EXAMINATION   Vital Signs: Temp:  [97.6 F (36.4 C)-100.8 F (38.2 C)] 97.6 F (36.4 C) (07/13 0730) Pulse Rate:  [68-146] 90 (07/13 0800) Resp:  [16-34] 19 (07/13 0800) BP: (86-139)/(42-70) 87/53 (07/13 0800) SpO2:  [88 %-100 %] 100 % (07/13 0800) FiO2 (%):  [45 %] 45 % (07/12 1810) Weight:  [55.5 kg-59.9 kg] 55.5 kg  (07/12 1900)  GENERAL: Age-appropriate HEAD: Normocephalic, atraumatic.  Well-healed chronic scar over occiput EYES: Pupils equal, round, reactive to light.  No scleral icterus.  MOUTH: Moist mucosal membrane. NECK: Supple. No thyromegaly. No nodules. No JVD.  PULMONARY: Pan inspiratory crepitations throughout all lung zones bilaterally CARDIOVASCULAR: S1 and S2. Regular rate and rhythm. No murmurs, rubs, or gallops.  GASTROINTESTINAL: Soft, nontender, non-distended. No masses. Positive bowel sounds. No hepatosplenomegaly.  MUSCULOSKELETAL: No swelling, clubbing, or 1-2+ pitting pedal edema NEUROLOGIC: Mild distress due to acute illness SKIN:intact,warm,dry   PERTINENT DATA     Infusions:  ampicillin-sulbactam (UNASYN) IV Stopped (03/14/21 0233)   Scheduled Medications:  Chlorhexidine Gluconate Cloth  6 each Topical Daily   digoxin  0.125  mg Oral Daily   furosemide  40 mg Intravenous Once   furosemide  40 mg Oral Daily   mouth rinse  15 mL Mouth Rinse BID   metoprolol tartrate  2.5 mg Intravenous Once   metoprolol tartrate  25 mg Oral BID   warfarin  2 mg Oral q1600   And   warfarin  2.5 mg Oral Q48H   Warfarin - Pharmacist Dosing Inpatient   Does not apply q1600   PRN Medications: ALPRAZolam, levalbuterol, LORazepam Hemodynamic parameters:   Intake/Output: 07/12 0701 - 07/13 0700 In: 619.4 [I.V.:28.1; IV Piggyback:591.2] Out: 150 [Urine:150]  Ventilator  Settings: FiO2 (%):  [45 %] 45 %  LAB RESULTS:  Basic Metabolic Panel: Recent Labs  Lab 03/08/21 0518 03/13/21 0527 03/14/21 0622  NA 135 136 138  K 4.1 5.1 4.6  CL 99 98 100  CO2 _0 GLUCOSE 88 110* 104*  BUN 36* 41* 40*  CREATININE 1.51* 1.60* 1.60*  CALCIUM 8.7* 9.0 8.8*  MG 1.8  --   --     Liver Function Tests: Recent Labs  Lab 03/13/21 0527  AST 41  ALT 26  ALKPHOS 108  BILITOT 2.0*  PROT 8.4*  ALBUMIN 3.3*    No results for input(s): LIPASE, AMYLASE in the last 168 hours. No  results for input(s): AMMONIA in the last 168 hours. CBC: Recent Labs  Lab 03/08/21 0518 03/13/21 0527 03/14/21 0622  WBC 12.2* 13.4* 16.3*  NEUTROABS  --  10.3*  --   HGB 8.1* 8.0* 6.5*  HCT 24.6* 24.0* 19.7*  MCV 95.7 97.2 100.5*  PLT 284 361 294    Cardiac Enzymes: No results for input(s): CKTOTAL, CKMB, CKMBINDEX, TROPONINI in the last 168 hours. BNP: Invalid input(s): POCBNP CBG: No results for input(s): GLUCAP in the last 168 hours.     IMAGING RESULTS:  Imaging: CT CHEST WO CONTRAST  Result Date: 03/13/2021 CLINICAL DATA:  Shortness of breath EXAM: CT CHEST WITHOUT CONTRAST TECHNIQUE: Multidetector CT imaging of the chest was performed following the standard protocol without IV contrast. COMPARISON:  02/28/2021 FINDINGS: Cardiovascular: Aneurysmal dilatation of the ascending thoracic aorta again noted measuring up to 4.7 cm, stable. Diffuse aortic atherosclerosis. Cardiomegaly. Diffuse coronary artery calcifications. Mediastinum/Nodes: Stable mildly prominent mediastinal lymph nodes. No axillary or visible hilar adenopathy. Lungs/Pleura: Decreasing right pleural effusion. Residual small right effusion. 2 adjacent nodules in the right upper lobe are again noted measuring 9 mm and 7 mm. Moderate emphysema. Patchy airspace disease throughout the right lung, most confluent in the right lower lobe. Scarring in the lingula and left lower lobe. Upper Abdomen: Imaging into the upper abdomen demonstrates no acute findings. Musculoskeletal: Chest wall soft tissues are unremarkable. No acute bony abnormality. 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 Recommend continued follow-up as noted on previous exams. Small right pleural effusion, decreasing since prior study. Patchy airspace disease throughout the right lung most confluent in the right lower lobe concerning for pneumonia. Two adjacent right upper lobe nodules. These could be followed with repeat CT in 6 months.  Cardiomegaly, coronary artery disease. Aortic Atherosclerosis (ICD10-I70.0) and Emphysema (ICD10-J43.9). Electronically Signed   By: Rolm Baptise M.D.   On: 03/13/2021 08:20   DG Chest Port 1 View  Result Date: 03/13/2021 CLINICAL DATA:  Shortness of breath EXAM: PORTABLE CHEST 1 VIEW COMPARISON:  Film from earlier in the same day. FINDINGS: Cardiac shadow remains enlarged. Lungs are again hyperinflated with diffuse interstitial densities worse on the right  than the left. Right-sided effusion is noted as well. The overall appearance is similar to that seen on the prior exam. No new focal abnormality is seen. IMPRESSION: Stable appearance of the chest when compared with earlier in the same day. Electronically Signed   By: Inez Catalina M.D.   On: 03/13/2021 19:24   DG Chest Portable 1 View  Result Date: 03/13/2021 CLINICAL DATA:  Shortness of breath. EXAM: PORTABLE CHEST 1 VIEW COMPARISON:  03/06/2021. FINDINGS: Similar enlargement the cardiac silhouette. Calcific atherosclerosis of the aorta. Ascending thoracic aortic aneurysm better characterized on prior CT chest. Slight increase in a small to moderate right pleural effusion. No visible pneumothorax. Mildly increased diffuse interstitial opacities with patchy airspace opacities. IMPRESSION: 1. Similar cardiomegaly with slight worsening of diffuse interstitial and patchy airspace opacities, most likely pulmonary edema/CHF. Infection not excluded. 2. Slightly increased small to moderate right pleural effusion. Electronically Signed   By: Margaretha Sheffield MD   On: 03/13/2021 06:36   _0 @ DG Chest Port 1 View  Result Date: 03/13/2021 CLINICAL DATA:  Shortness of breath EXAM: PORTABLE CHEST 1 VIEW COMPARISON:  Film from earlier in the same day. FINDINGS: Cardiac shadow remains enlarged. Lungs are again hyperinflated with diffuse interstitial densities worse on the right than the left. Right-sided effusion is noted as well. The overall appearance is  similar to that seen on the prior exam. No new focal abnormality is seen. IMPRESSION: Stable appearance of the chest when compared with earlier in the same day. Electronically Signed   By: Inez Catalina M.D.   On: 03/13/2021 19:24        ASSESSMENT AND PLAN    -Multidisciplinary rounds held today  Acute on chronic hypoxic Respiratory Failure -Likely due to decompensated systolic CHF with cardiorenal syndrome -Cardiology and nephrology consultation -Patient may benefit from NIV with BiPAP to unload LV while in pulmonary edema -Gentle diuresis  Accelerated atrial fibrillation -Digoxin on outpatient-obtain digoxin level, repeat twelve-lead EKG -Consider amiodarone during my evaluation heart rate has actually decreased to less than 100 -Patient on warfarin while in chronic stable state-INR is 2.3 today 03/13/2021 ICU telemetry monitoring Metoprolol 25 twice daily  Aspiration pneumonia Continue Unasyn  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 -follow chem 7 -follow UO -continue Foley Catheter-assess need daily Nephrology consultation-appreciate input  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 -Patient followed by hematology/oncology continue Promacta 50 mg -Chronic iron deficiency anemia continue Retacrit  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  -Xopenex every 6 hours as needed  -No need for steroids at this time  Severe protein calorie malnutrition physical deconditioning -Despite increased LOS with need for: Inpatient rehab -PT OT while hospitalized  ID -continue IV abx as prescibed -follow up cultures  GI/Nutrition GI PROPHYLAXIS as indicated DIET-->TF's as tolerated Constipation protocol as indicated  ENDO - ICU hypoglycemic\Hyperglycemia protocol -check FSBS per protocol   ELECTROLYTES -follow labs as needed -replace as needed -pharmacy consultation   DVT/GI PRX ordered -SCDs  TRANSFUSIONS AS NEEDED MONITOR FSBS ASSESS the need for  LABS as needed   Critical care provider statement:    Critical care time (minutes):  33   Critical care time was exclusive of:  Separately billable procedures and treating other patients   Critical care was necessary to treat or prevent imminent or life-threatening deterioration of the following conditions: Acute on chronic hypoxemic respiratory failure, accelerated atrial fibrillation, acute on chronic renal failure, advanced COPD, idiopathic thrombocytopenic purpura, chronic anemia   Critical care was time spent personally  by me on the following activities:  Development of treatment plan with patient or surrogate, discussions with consultants, evaluation of patient's response to treatment, examination of patient, obtaining history from patient or surrogate, ordering and performing treatments and interventions, ordering and review of laboratory studies and re-evaluation of patient's condition.  I assumed direction of critical care for this patient from another provider in my specialty: no    This document was prepared using Dragon voice recognition software and may include unintentional dictation errors.    Ottie Glazier, M.D.  Division of [place]

## 2021-03-15 DIAGNOSIS — J181 Lobar pneumonia, unspecified organism: Secondary | ICD-10-CM | POA: Diagnosis not present

## 2021-03-15 LAB — BPAM RBC
Blood Product Expiration Date: 202207132359
ISSUE DATE / TIME: 202207131140
Unit Type and Rh: 9500

## 2021-03-15 LAB — PROTIME-INR
INR: 2.8 — ABNORMAL HIGH (ref 0.8–1.2)
Prothrombin Time: 29.7 seconds — ABNORMAL HIGH (ref 11.4–15.2)

## 2021-03-15 LAB — CBC WITH DIFFERENTIAL/PLATELET
Abs Immature Granulocytes: 0.09 10*3/uL — ABNORMAL HIGH (ref 0.00–0.07)
Basophils Absolute: 0.1 10*3/uL (ref 0.0–0.1)
Basophils Relative: 0 %
Eosinophils Absolute: 0.3 10*3/uL (ref 0.0–0.5)
Eosinophils Relative: 2 %
HCT: 23.6 % — ABNORMAL LOW (ref 39.0–52.0)
Hemoglobin: 7.8 g/dL — ABNORMAL LOW (ref 13.0–17.0)
Immature Granulocytes: 1 %
Lymphocytes Relative: 6 %
Lymphs Abs: 0.8 10*3/uL (ref 0.7–4.0)
MCH: 32.2 pg (ref 26.0–34.0)
MCHC: 33.1 g/dL (ref 30.0–36.0)
MCV: 97.5 fL (ref 80.0–100.0)
Monocytes Absolute: 1.7 10*3/uL — ABNORMAL HIGH (ref 0.1–1.0)
Monocytes Relative: 13 %
Neutro Abs: 10.3 10*3/uL — ABNORMAL HIGH (ref 1.7–7.7)
Neutrophils Relative %: 78 %
Platelets: 302 10*3/uL (ref 150–400)
RBC: 2.42 MIL/uL — ABNORMAL LOW (ref 4.22–5.81)
RDW: 18 % — ABNORMAL HIGH (ref 11.5–15.5)
WBC: 13.2 10*3/uL — ABNORMAL HIGH (ref 4.0–10.5)
nRBC: 0 % (ref 0.0–0.2)

## 2021-03-15 LAB — TYPE AND SCREEN
ABO/RH(D): O POS
Antibody Screen: NEGATIVE
Unit division: 0

## 2021-03-15 LAB — BASIC METABOLIC PANEL
Anion gap: 13 (ref 5–15)
BUN: 41 mg/dL — ABNORMAL HIGH (ref 8–23)
CO2: 27 mmol/L (ref 22–32)
Calcium: 8.8 mg/dL — ABNORMAL LOW (ref 8.9–10.3)
Chloride: 100 mmol/L (ref 98–111)
Creatinine, Ser: 1.5 mg/dL — ABNORMAL HIGH (ref 0.61–1.24)
GFR, Estimated: 47 mL/min — ABNORMAL LOW (ref >60)
Glucose, Bld: 100 mg/dL — ABNORMAL HIGH (ref 70–99)
Potassium: 4.4 mmol/L (ref 3.5–5.1)
Sodium: 140 mmol/L (ref 135–145)

## 2021-03-15 MED ORDER — OXYCODONE HCL 5 MG PO TABS: 5.0000 mg | ORAL_TABLET | Freq: Three times a day (TID) | ORAL | 0 refills | Status: AC | PRN

## 2021-03-15 MED ORDER — AMOXICILLIN-POT CLAVULANATE 875-125 MG PO TABS: 1.0000 | ORAL_TABLET | Freq: Two times a day (BID) | ORAL | 0 refills | Status: AC

## 2021-03-15 NOTE — TOC Transition Note (Signed)
Transition of Care [facility]) - CM/SW Discharge Note   Patient Details  Name: Samuel Mcknight MRN: [id number] Date of Birth: 20-Oct-1940  Transition of Care [facility]) CM/SW Contact:  Ova Freshwater Phone Number: [phone] 03/15/2021, 2:35 PM   Clinical Narrative:     Patient will d/c home with [facility] hospice, Sonia Baller RN [phone] spoke with the patient and Vitucci,Donna (Spouse)  [phone] [facility] Phone).  Patient refused DME.  Patient will transport home with spouse. Attending and ICU RN updated.        Patient Goals and CMS Choice        Discharge Placement                       Discharge Plan and Services                                     Social Determinants of Health (SDOH) Interventions     Readmission Risk Interventions Readmission Risk Prevention Plan 03/02/2021  Transportation Screening Complete  PCP or Specialist Appt within 5-7 Days Complete  Home Care Screening Complete  Medication Review (RN CM) Complete  Some recent data might be hidden

## 2021-03-15 NOTE — Progress Notes (Signed)
Samuel Mcknight   DOB:11-20-1940   FW#:[id number]    Subjective: No acute events overnight.  Patient continues have shortness of breath and increased respiratory rate.  There is no nausea no vomiting.  Continues have difficulty swallowing.  Objective:  Vitals:   03/15/21 1400 03/15/21 1500  BP: 115/75 (!) 113/59  Pulse: (!) 122 (!) 130  Resp: (!) 40 (!) 44  Temp:    SpO2: 97% 100%     Intake/Output Summary (Last 24 hours) at 03/15/2021 2042 Last data filed at 03/15/2021 1100 Gross per 24 hour  Intake 220 ml  Output 475 ml  Net -255 ml    Physical Exam Vitals and nursing note reviewed.  Constitutional:      Comments: Patient resting in the bed.  Cachectic appearing.  Nasal cannula oxygen: 4L  Accompanied by family  HENT:     Head: Normocephalic and atraumatic.     Mouth/Throat:     Mouth: Mucous membranes are moist.     Pharynx: No oropharyngeal exudate.  Eyes:     Pupils: Pupils are equal, round, and reactive to light.  Cardiovascular:     Rate and Rhythm: Normal rate and regular rhythm.  Pulmonary:     Effort: No respiratory distress.     Breath sounds: No wheezing.     Comments: Decreased breath sounds bilaterally at bases.  No wheeze or crackles Abdominal:     General: Bowel sounds are normal. There is no distension.     Palpations: Abdomen is soft. There is no mass.     Tenderness: There is no abdominal tenderness. There is no guarding or rebound.  Musculoskeletal:        General: No tenderness. Normal range of motion.     Cervical back: Normal range of motion and neck supple.  Skin:    General: Skin is warm.  Neurological:     Mental Status: He is alert and oriented to person, place, and time.  Psychiatric:        Mood and Affect: Affect normal.        Judgment: Judgment normal.     Labs:  Lab Results  Component Value Date   WBC 13.2 (H) 03/15/2021   HGB 7.8 (L) 03/15/2021   HCT 23.6 (L) 03/15/2021   MCV 97.5 03/15/2021   PLT 302 03/15/2021    NEUTROABS 10.3 (H) 03/15/2021    Lab Results  Component Value Date   NA 140 03/15/2021   K 4.4 03/15/2021   CL 100 03/15/2021   CO2 27 03/15/2021    Studies:  No results found.  Anemia of chronic kidney failure, stage 3 (moderate) #80 year old male patient with multiple medical problems including anemia/CKD severe aortic stenosis/A. fib on Coumadin/is currently admitted to hospital for difficulty swallowing/concern for aspiration pneumonia  #Severe anemia secondary to CKD/iron deficiency-on outpatient Retacrit/ IV Venofer-status post PRBC transfusion .  #Anticoagulation for A. Fib-patient on Coumadin-currently Coumadin discontinued  #Chronic ITP-stable.  [on Coumadin]-Promacta could be discontinued-see below  #Acute on chronic respiratory failure -CHF/severe aortic stenosis/recurrent right pleural effusion/severe COPD-see below  #Given overall poor cardiorespiratory status/declining quality of life/poor performance status-patient/significant other is interested in hospice-as they are aware that further medical intervention is not going to offer them significant quality of life.  Hospice has met with the patient/significant other.  Plan is to discharge home on hospice this afternoon.  Discussed with patient and his girlfriend regarding minimal contribution of continued Retacrit injections in the context of his overall poor prognosis.  Retacrit/IV iron infusions will be discontinued-they are in agreement.  Cammie Sickle, MD 03/15/2021  8:42 PM

## 2021-03-15 NOTE — Discharge Summary (Signed)
Physician Discharge Summary  Samuel Mcknight ZOX:[id number] DOB: 1940-10-30 DOA: 03/13/2021  PCP: Kirk Ruths, MD  Admit date: 03/13/2021 Discharge date: 03/15/2021  Admitted From: Home Disposition: Home with hospice  Recommendations for Outpatient Follow-up:  Per hospice providers  Home Health: No Equipment/Devices: Oxygen 2 L nasal cannula  Discharge Condition: Hospice CODE STATUS: DNR Diet recommendation: Dysphagia  Brief/Interim Summary: 80 y.o. 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  He states that his symptoms are usually worse at night and he is unable to lay flat in bed.  He is having to sleep sitting upright.  He denies having any cough but states that sometimes when he drinks water he starts coughing.  He denies having any fever or chills but had a low-grade temp upon arrival to the ER.   On the night of admission patient was emergently transferred to the intensive care unit with intensivist consult due to worsening respiratory status and increased oxygen demand.  His respiratory status is stabilized and is returned to near baseline.   SLP is evaluated patient and deemed him a very high aspiration risk and recommended strict n.p.o.  Patient was seen by palliative care and after conversation the patient is a refusing an enteral feeding tube placement.  ICU on consult as well as nephrology and hematology.  Appreciate recommendations from a consult providers.  Patient seen in consultation by palliative care.  After lengthy discussions with palliative care, myself, ICU the patient made the decision that he wishes to eat and wishes to go home and engage hospice services.  Again we had a lengthy discussion about hospice  philosophy.  The patient's wife has numerous questions about her ability to seek out treatment once he does not get hospice services.  I explained that treatment of acute illnesses is not the focus of hospice and rather they are focus on patient comfort and improve quality of life.  All questions were answered to the best of my abilities.  Patient and his wife expressed satisfaction and appreciation.  Patient will be discharged home and [facility] physician will review and determine appropriate hospice services.   Discharge Diagnoses:  Principal Problem:   Lobar pneumonia ([place]) Active Problems:   Anemia of chronic kidney failure, stage 3 (moderate)   Atrial fibrillation, chronic (HCC)   Essential hypertension   Chronic respiratory failure (HCC)   Chronic diastolic CHF (congestive heart failure) (HCC)   Aortic stenosis   Acute on chronic respiratory failure (HCC)   Dysphagia, oropharyngeal phase   Pressure injury of skin   Protein-calorie malnutrition, severe  Aspiration ammonia Sepsis secondary to above Initially patient came in very tachycardic, tachypneic with elevated white blood cell count.  Patient is known very high aspiration risk.  CT scan with right lower lobe pneumonia consistent with aspiration.  After speech pathology evaluation patient was recommended strict n.p.o. as he was noted to demonstrate signs of aspirations on all food consistency.  We did propose the possibility of a feeding tube which the patient refused.  Palliative care involved and the patient made the decision to return home with hospice services.  Chronic diastolic congestive heart failure Chronic atrial fibrillation Anxiety disorder At time of discharge I have continued all medications for patient's chronic illnesses.  I have stopped his anticoagulation  however continued his rate control for atrial fibrillation.  Further discussion regarding the value of these medications if patient were to continue with  hospice services will be deferred to outpatient providers.  Discharge Instructions  Discharge Instructions     Diet - low sodium heart healthy   Complete by: As directed    Increase activity slowly   Complete by: As directed    No wound care   Complete by: As directed       Allergies as of 03/15/2021       Reactions   Bupropion Swelling   Amiodarone Rash   Broke out in dark blue blotches   Diltiazem Rash        Medication List     STOP taking these medications    digoxin 0.125 MG tablet Commonly known as: LANOXIN   eltrombopag 25 MG tablet Commonly known as: PROMACTA   warfarin 2 MG tablet Commonly known as: COUMADIN   warfarin 2.5 MG tablet Commonly known as: COUMADIN       TAKE these medications    ALPRAZolam 0.5 MG tablet Commonly known as: XANAX Take 1 mg by mouth 2 (two) times daily as needed for anxiety.   amoxicillin-clavulanate 875-125 MG tablet Commonly known as: Augmentin Take 1 tablet by mouth 2 (two) times daily for 5 days. Start taking on: March 16, 2021   furosemide 40 MG tablet Commonly known as: LASIX Take 1 tablet (40 mg total) by mouth daily. Increase from 20 mg daily.   metoprolol tartrate 25 MG tablet Commonly known as: LOPRESSOR Take 1 tablet (25 mg total) by mouth 2 (two) times daily.   oxyCODONE 5 MG immediate release tablet Commonly known as: Roxicodone Take 1 tablet (5 mg total) by mouth every 8 (eight) hours as needed.        Allergies  Allergen Reactions   Bupropion Swelling   Amiodarone Rash    Broke out in dark blue blotches   Diltiazem Rash    Consultations: ICU Nephrology Palliative care Oncology   Procedures/Studies: DG Chest 2 View  Result Date: 03/06/2021 CLINICAL DATA:  Shortness of breath EXAM: CHEST - 2 VIEW COMPARISON:  03/01/2021 FINDINGS: Unchanged cardiomegaly. Mild interval worsening of pulmonary vascular congestion. Small right pleural effusion is increased since prior examination.  Scattered patchy pulmonary opacities are most likely due to pulmonary edema. IMPRESSION: Mild interval worsening of findings of CHF/fluid volume overload. Small right pleural effusion is mildly increased in size since prior exam. Electronically Signed   By: Miachel Roux M.D.   On: 03/06/2021 14:51   CT CHEST WO CONTRAST  Result Date: 03/13/2021 CLINICAL DATA:  Shortness of breath EXAM: CT CHEST WITHOUT CONTRAST TECHNIQUE: Multidetector CT imaging of the chest was performed following the standard protocol without IV contrast. COMPARISON:  02/28/2021 FINDINGS: Cardiovascular: Aneurysmal dilatation of the ascending thoracic aorta again noted measuring up to 4.7 cm, stable. Diffuse aortic atherosclerosis. Cardiomegaly. Diffuse coronary artery calcifications. Mediastinum/Nodes: Stable mildly prominent mediastinal lymph nodes. No axillary or visible hilar adenopathy. Lungs/Pleura: Decreasing right pleural effusion. Residual small right effusion. 2 adjacent nodules in the right upper lobe are again noted measuring 9 mm and 7 mm. Moderate emphysema. Patchy airspace disease throughout the right lung, most confluent in the right lower lobe. Scarring in the lingula and left lower lobe. Upper Abdomen: Imaging into the upper abdomen demonstrates no acute findings. Musculoskeletal: Chest wall soft tissues are unremarkable. No acute bony abnormality. 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 Recommend continued  follow-up as noted on previous exams. Small right pleural effusion, decreasing since prior study. Patchy airspace disease throughout the right lung most confluent in the right lower lobe concerning for pneumonia. Two adjacent right upper lobe nodules. These could be followed with repeat CT in 6 months. Cardiomegaly, coronary artery disease. Aortic Atherosclerosis (ICD10-I70.0) and Emphysema (ICD10-J43.9). Electronically Signed   By: Rolm Baptise M.D.   On: 03/13/2021 08:20   CT Angio Chest PE W  and/or Wo Contrast  Result Date: 02/28/2021 CLINICAL DATA:  Shortness of breath EXAM: CT ANGIOGRAPHY CHEST WITH CONTRAST TECHNIQUE: Multidetector CT imaging of the chest was performed using the standard protocol during bolus administration of intravenous contrast. Multiplanar CT image reconstructions and MIPs were obtained to evaluate the vascular anatomy. CONTRAST:  51mL OMNIPAQUE IOHEXOL 350 MG/ML SOLN COMPARISON:  11/07/2020 FINDINGS: Cardiovascular: No filling defects in the pulmonary arteries to suggest pulmonary emboli. Cardiomegaly. Aneurysmal dilatation of the ascending thoracic aorta measures 4.7 cm. Mildly dilated main pulmonary artery measures 4.1 cm. Coronary artery and aortic calcifications. Mediastinum/Nodes: No mediastinal, hilar, or axillary adenopathy. Trachea and esophagus are unremarkable. Thyroid unremarkable. Lungs/Pleura: Mild emphysema. Large right pleural effusion and small left pleural effusion. Airspace disease in the right lower lobe and right middle lobe could reflect atelectasis or pneumonia. Patchy ground-glass peripheral opacities in the left lower lobe. Upper Abdomen: Imaging into the upper abdomen demonstrates no acute findings. Musculoskeletal: Chest wall soft tissues are unremarkable. No acute bony abnormality. Review of the MIP images confirms the above findings. IMPRESSION: No evidence of pulmonary embolus. Large right pleural effusion and small left pleural effusion. Airspace disease in the right lower lobe and right middle lobe could reflect atelectasis or pneumonia. Patchy opacities in the left lower lobe concerning for early pneumonia. 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 Recommend semi-annual imaging followup by CTA or MRA and referral to cardiothoracic surgery if not already obtained. This recommendation follows 2010 ACCF/AHA/AATS/ACR/ASA/SCA/SCAI/SIR/STS/SVM Guidelines for the Diagnosis and  Management of Patients With Thoracic Aortic Disease. Circulation. 2010; 121: O037-C488. Aortic aneurysm NOS (ICD10-I71.9) Aortic Atherosclerosis (ICD10-I70.0) and Emphysema (ICD10-J43.9). Electronically Signed   By: Rolm Baptise M.D.   On: 02/28/2021 02:52   DG Chest Port 1 View  Result Date: 03/13/2021 CLINICAL DATA:  Shortness of breath EXAM: PORTABLE CHEST 1 VIEW COMPARISON:  Film from earlier in the same day. FINDINGS: Cardiac shadow remains enlarged. Lungs are again hyperinflated with diffuse interstitial densities worse on the right than the left. Right-sided effusion is noted as well. The overall appearance is similar to that seen on the prior exam. No new focal abnormality is seen. IMPRESSION: Stable appearance of the chest when compared with earlier in the same day. Electronically Signed   By: Inez Catalina M.D.   On: 03/13/2021 19:24   DG Chest Portable 1 View  Result Date: 03/13/2021 CLINICAL DATA:  Shortness of breath. EXAM: PORTABLE CHEST 1 VIEW COMPARISON:  03/06/2021. FINDINGS: Similar enlargement the cardiac silhouette. Calcific atherosclerosis of the aorta. Ascending thoracic aortic aneurysm better characterized on prior CT chest. Slight increase in a small to moderate right pleural effusion. No visible pneumothorax. Mildly increased diffuse interstitial opacities with patchy airspace opacities. IMPRESSION: 1. Similar cardiomegaly with slight worsening of diffuse interstitial and patchy airspace opacities, most likely pulmonary edema/CHF. Infection not excluded. 2. Slightly increased small to moderate right pleural effusion. Electronically Signed   By: Margaretha Sheffield MD   On: 03/13/2021 06:36   DG Chest Port 1  View  Result Date: 03/01/2021 CLINICAL DATA:  Status post right thoracentesis. EXAM: PORTABLE CHEST 1 VIEW COMPARISON:  02/28/2021 FINDINGS: Stable cardiac enlargement. Significant diminishment in right pleural fluid volume after thoracentesis. There may be a tiny amount of  pleural air at the lateral right lung base. Improved aeration of the right lower lung. Slight decrease in pulmonary edema. IMPRESSION: Decrease in right pleural fluid volume after thoracentesis with improved aeration of the right lung. Tiny amount of pleural air present adjacent to the lung at the right lung base. Slight decrease in pulmonary edema. Electronically Signed   By: Aletta Edouard M.D.   On: 03/01/2021 17:14   DG Chest Portable 1 View  Result Date: 02/28/2021 CLINICAL DATA:  Shortness of breath EXAM: PORTABLE CHEST 1 VIEW COMPARISON:  10/27/2020 FINDINGS: Small right pleural effusion with basilar atelectasis. Mild cardiomegaly. Right-greater-than-left mild interstitial opacity. IMPRESSION: Cardiomegaly, small right pleural effusion and mild pulmonary edema. Electronically Signed   By: Ulyses Jarred M.D.   On: 02/28/2021 01:20   US THORACENTESIS ASP PLEURAL SPACE W/IMG GUIDE  Result Date: 03/01/2021 CLINICAL DATA:  Shortness of breath and moderate to large right pleural effusion. EXAM: ULTRASOUND GUIDED RIGHT THORACENTESIS COMPARISON:  None. PROCEDURE: An ultrasound guided thoracentesis was thoroughly discussed with the patient and questions answered. The benefits, risks, alternatives and complications were also discussed. The patient understands and wishes to proceed with the procedure. Written consent was obtained. Ultrasound was performed to localize and mark an adequate pocket of fluid in the right chest. The area was then prepped and draped in the normal sterile fashion. 1% Lidocaine was used for local anesthesia. Under ultrasound guidance a 6 French Safe-T-Centesis catheter was introduced. Thoracentesis was performed. The catheter was removed and a dressing applied. COMPLICATIONS: None FINDINGS: A total of approximately 1.9 L of amber fluid was removed. A fluid sample was sent for laboratory analysis. IMPRESSION: Successful ultrasound guided right thoracentesis yielding 1.9 L of pleural  fluid. Electronically Signed   By: Aletta Edouard M.D.   On: 03/01/2021 17:12   (Echo, Carotid, EGD, Colonoscopy, ERCP)    Subjective: Patient seen and examined on the day of discharge.  Appears fatigued but at baseline.  Wife at bedside.  Patient in no visible distress.  Lengthy discussion with patient and wife.  All questions were answered.  Discharge Exam: Vitals:   03/15/21 1200 03/15/21 1300  BP: 106/68 109/70  Pulse: (!) 102 (!) 118  Resp: 20 (!) 22  Temp: 98.2 F (36.8 C)   SpO2: 100% 96%   Vitals:   03/15/21 1000 03/15/21 1100 03/15/21 1200 03/15/21 1300  BP: (!) 80/53 (!) 102/58 106/68 109/70  Pulse: 86 (!) 127 (!) 102 (!) 118  Resp: (!) 29 (!) 23 20 (!) 22  Temp:   98.2 F (36.8 C)   TempSrc:   Oral   SpO2: 98% 100% 100% 96%  Weight:      Height:        General: Pt is alert, awake, not in acute distress Cardiovascular: RRR, S1/S2 +, no rubs, no gallops Respiratory: CTA bilaterally, no wheezing, no rhonchi Abdominal: Soft, NT, ND, bowel sounds + Extremities: no edema, no cyanosis    The results of significant diagnostics from this hospitalization (including imaging, microbiology, ancillary and laboratory) are listed below for reference.     Microbiology: Recent Results (from the past 240 hour(s))  Resp Panel by RT-PCR (Flu A&B, Covid) Nasopharyngeal Swab     Status: None   Collection Time: 03/06/21  6:46 PM   Specimen: Nasopharyngeal Swab; Nasopharyngeal(NP) swabs in vial transport medium  Result Value Ref Range Status   SARS Coronavirus 2 by RT PCR NEGATIVE NEGATIVE Final    Comment: (NOTE) SARS-CoV-2 target nucleic acids are NOT DETECTED.  The SARS-CoV-2 RNA is generally detectable in upper respiratory specimens during the acute phase of infection. The lowest concentration of SARS-CoV-2 viral copies this assay can detect is 138 copies/mL. A negative result does not preclude SARS-Cov-2 infection and should not be used as the sole basis for treatment  or other patient management decisions. A negative result may occur with  improper specimen collection/handling, submission of specimen other than nasopharyngeal swab, presence of viral mutation(s) within the areas targeted by this assay, and inadequate number of viral copies(<138 copies/mL). A negative result must be combined with clinical observations, patient history, and epidemiological information. The expected result is Negative.  Fact Sheet for Patients:  EntrepreneurPulse.com.au  Fact Sheet for Healthcare Providers:  IncredibleEmployment.be  This test is no t yet approved or cleared by the [place] FDA and  has been authorized for detection and/or diagnosis of SARS-CoV-2 by FDA under an Emergency Use Authorization (EUA). This EUA will remain  in effect (meaning this test can be used) for the duration of the COVID-19 declaration under Section 564(b)(1) of the Act, 21 U.S.C.section 360bbb-3(b)(1), unless the authorization is terminated  or revoked sooner.       Influenza A by PCR NEGATIVE NEGATIVE Final   Influenza B by PCR NEGATIVE NEGATIVE Final    Comment: (NOTE) The Xpert Xpress SARS-CoV-2/FLU/RSV plus assay is intended as an aid in the diagnosis of influenza from Nasopharyngeal swab specimens and should not be used as a sole basis for treatment. Nasal washings and aspirates are unacceptable for Xpert Xpress SARS-CoV-2/FLU/RSV testing.  Fact Sheet for Patients: EntrepreneurPulse.com.au  Fact Sheet for Healthcare Providers: IncredibleEmployment.be  This test is not yet approved or cleared by the [place] FDA and has been authorized for detection and/or diagnosis of SARS-CoV-2 by FDA under an Emergency Use Authorization (EUA). This EUA will remain in effect (meaning this test can be used) for the duration of the COVID-19 declaration under Section 564(b)(1) of the Act, 21 U.S.C. section  360bbb-3(b)(1), unless the authorization is terminated or revoked.  Performed at [facility] [address] [place], [place] [address]   Blood culture (routine x 2)     Status: None (Preliminary result)   Collection Time: 03/13/21  5:27 AM   Specimen: BLOOD  Result Value Ref Range Status   Specimen Description BLOOD LEFT ARM  Final   Special Requests   Final    BOTTLES DRAWN AEROBIC AND ANAEROBIC Blood Culture adequate volume   Culture   Final    NO GROWTH 2 DAYS Performed at [facility], [address], [place], [place] [address]    Report Status PENDING  Incomplete  Blood culture (routine x 2)     Status: None (Preliminary result)   Collection Time: 03/13/21  5:27 AM   Specimen: BLOOD  Result Value Ref Range Status   Specimen Description BLOOD LEFT ARM  Final   Special Requests   Final    BOTTLES DRAWN AEROBIC AND ANAEROBIC Blood Culture adequate volume   Culture   Final    NO GROWTH 2 DAYS Performed at [facility], [address], [place], [place] [address]    Report Status PENDING  Incomplete  Resp Panel by RT-PCR (Flu A&B, Covid) Nasopharyngeal Swab  Status: None   Collection Time: 03/13/21  5:28 AM   Specimen: Nasopharyngeal Swab; Nasopharyngeal(NP) swabs in vial transport medium  Result Value Ref Range Status   SARS Coronavirus 2 by RT PCR NEGATIVE NEGATIVE Final    Comment: (NOTE) SARS-CoV-2 target nucleic acids are NOT DETECTED.  The SARS-CoV-2 RNA is generally detectable in upper respiratory specimens during the acute phase of infection. The lowest concentration of SARS-CoV-2 viral copies this assay can detect is 138 copies/mL. A negative result does not preclude SARS-Cov-2 infection and should not be used as the sole basis for treatment or other patient management decisions. A negative result may occur with  improper specimen collection/handling, submission of specimen other than nasopharyngeal swab, presence of viral  mutation(s) within the areas targeted by this assay, and inadequate number of viral copies(<138 copies/mL). A negative result must be combined with clinical observations, patient history, and epidemiological information. The expected result is Negative.  Fact Sheet for Patients:  EntrepreneurPulse.com.au  Fact Sheet for Healthcare Providers:  IncredibleEmployment.be  This test is no t yet approved or cleared by the [place] FDA and  has been authorized for detection and/or diagnosis of SARS-CoV-2 by FDA under an Emergency Use Authorization (EUA). This EUA will remain  in effect (meaning this test can be used) for the duration of the COVID-19 declaration under Section 564(b)(1) of the Act, 21 U.S.C.section 360bbb-3(b)(1), unless the authorization is terminated  or revoked sooner.       Influenza A by PCR NEGATIVE NEGATIVE Final   Influenza B by PCR NEGATIVE NEGATIVE Final    Comment: (NOTE) The Xpert Xpress SARS-CoV-2/FLU/RSV plus assay is intended as an aid in the diagnosis of influenza from Nasopharyngeal swab specimens and should not be used as a sole basis for treatment. Nasal washings and aspirates are unacceptable for Xpert Xpress SARS-CoV-2/FLU/RSV testing.  Fact Sheet for Patients: EntrepreneurPulse.com.au  Fact Sheet for Healthcare Providers: IncredibleEmployment.be  This test is not yet approved or cleared by the [place] FDA and has been authorized for detection and/or diagnosis of SARS-CoV-2 by FDA under an Emergency Use Authorization (EUA). This EUA will remain in effect (meaning this test can be used) for the duration of the COVID-19 declaration under Section 564(b)(1) of the Act, 21 U.S.C. section 360bbb-3(b)(1), unless the authorization is terminated or revoked.  Performed at [facility], [address] [place], [place] [address]   MRSA Next Gen by PCR, Nasal      Status: None   Collection Time: 03/13/21  6:58 PM   Specimen: Nasal Mucosa; Nasal Swab  Result Value Ref Range Status   MRSA by PCR Next Gen NOT DETECTED NOT DETECTED Final    Comment: (NOTE) The GeneXpert MRSA Assay (FDA approved for NASAL specimens only), is one component of a comprehensive MRSA colonization surveillance program. It is not intended to diagnose MRSA infection nor to guide or monitor treatment for MRSA infections. Test performance is not FDA approved in patients less than 43 years old. Performed at [facility], [address] [place], [place] [address]      Labs: BNP (last 3 results) Recent Labs    02/28/21 0101 03/06/21 1728 03/13/21 0527  BNP 1,033.9* 1,093.7* 9,924.2*   Basic Metabolic Panel: Recent Labs  Lab 03/13/21 0527 03/14/21 0622 03/15/21 0730  NA 136 138 140  K 5.1 4.6 4.4  CL 98 100 100  CO2 29 28 27   GLUCOSE 110* 104* 100*  BUN 41* 40* 41*  CREATININE 1.60* 1.60*  1.50*  CALCIUM 9.0 8.8* 8.8*   Liver Function Tests: Recent Labs  Lab 03/13/21 0527  AST 41  ALT 26  ALKPHOS 108  BILITOT 2.0*  PROT 8.4*  ALBUMIN 3.3*   No results for input(s): LIPASE, AMYLASE in the last 168 hours. No results for input(s): AMMONIA in the last 168 hours. CBC: Recent Labs  Lab 03/13/21 0527 03/14/21 0622 03/14/21 1020 03/15/21 0730  WBC 13.4* 16.3*  --  13.2*  NEUTROABS 10.3*  --   --  10.3*  HGB 8.0* 6.5* 6.5* 7.8*  HCT 24.0* 19.7*  --  23.6*  MCV 97.2 100.5*  --  97.5  PLT 361 294  --  302   Cardiac Enzymes: No results for input(s): CKTOTAL, CKMB, CKMBINDEX, TROPONINI in the last 168 hours. BNP: Invalid input(s): POCBNP CBG: Recent Labs  Lab 03/13/21 1857  GLUCAP 99   D-Dimer No results for input(s): DDIMER in the last 72 hours. Hgb A1c No results for input(s): HGBA1C in the last 72 hours. Lipid Profile No results for input(s): CHOL, HDL, LDLCALC, TRIG, CHOLHDL, LDLDIRECT in the last 72 hours. Thyroid function  studies No results for input(s): TSH, T4TOTAL, T3FREE, THYROIDAB in the last 72 hours.  Invalid input(s): FREET3 Anemia work up No results for input(s): VITAMINB12, FOLATE, FERRITIN, TIBC, IRON, RETICCTPCT in the last 72 hours. Urinalysis    Component Value Date/Time   COLORURINE YELLOW (A) 03/06/2021 1849   APPEARANCEUR CLEAR (A) 03/06/2021 1849   LABSPEC 1.012 03/06/2021 1849   PHURINE 5.0 03/06/2021 1849   GLUCOSEU NEGATIVE 03/06/2021 1849   HGBUR NEGATIVE 03/06/2021 1849   BILIRUBINUR NEGATIVE 03/06/2021 [place] 03/06/2021 1849   PROTEINUR 30 (A) 03/06/2021 1849   NITRITE NEGATIVE 03/06/2021 1849   LEUKOCYTESUR NEGATIVE 03/06/2021 1849   Sepsis Labs Invalid input(s): PROCALCITONIN,  WBC,  LACTICIDVEN Microbiology Recent Results (from the past 240 hour(s))  Resp Panel by RT-PCR (Flu A&B, Covid) Nasopharyngeal Swab     Status: None   Collection Time: 03/06/21  6:46 PM   Specimen: Nasopharyngeal Swab; Nasopharyngeal(NP) swabs in vial transport medium  Result Value Ref Range Status   SARS Coronavirus 2 by RT PCR NEGATIVE NEGATIVE Final    Comment: (NOTE) SARS-CoV-2 target nucleic acids are NOT DETECTED.  The SARS-CoV-2 RNA is generally detectable in upper respiratory specimens during the acute phase of infection. The lowest concentration of SARS-CoV-2 viral copies this assay can detect is 138 copies/mL. A negative result does not preclude SARS-Cov-2 infection and should not be used as the sole basis for treatment or other patient management decisions. A negative result may occur with  improper specimen collection/handling, submission of specimen other than nasopharyngeal swab, presence of viral mutation(s) within the areas targeted by this assay, and inadequate number of viral copies(<138 copies/mL). A negative result must be combined with clinical observations, patient history, and epidemiological information. The expected result is Negative.  Fact  Sheet for Patients:  EntrepreneurPulse.com.au  Fact Sheet for Healthcare Providers:  IncredibleEmployment.be  This test is no t yet approved or cleared by the [place] FDA and  has been authorized for detection and/or diagnosis of SARS-CoV-2 by FDA under an Emergency Use Authorization (EUA). This EUA will remain  in effect (meaning this test can be used) for the duration of the COVID-19 declaration under Section 564(b)(1) of the Act, 21 U.S.C.section 360bbb-3(b)(1), unless the authorization is terminated  or revoked sooner.       Influenza A by PCR NEGATIVE NEGATIVE Final  Influenza B by PCR NEGATIVE NEGATIVE Final    Comment: (NOTE) The Xpert Xpress SARS-CoV-2/FLU/RSV plus assay is intended as an aid in the diagnosis of influenza from Nasopharyngeal swab specimens and should not be used as a sole basis for treatment. Nasal washings and aspirates are unacceptable for Xpert Xpress SARS-CoV-2/FLU/RSV testing.  Fact Sheet for Patients: EntrepreneurPulse.com.au  Fact Sheet for Healthcare Providers: IncredibleEmployment.be  This test is not yet approved or cleared by the [place] FDA and has been authorized for detection and/or diagnosis of SARS-CoV-2 by FDA under an Emergency Use Authorization (EUA). This EUA will remain in effect (meaning this test can be used) for the duration of the COVID-19 declaration under Section 564(b)(1) of the Act, 21 U.S.C. section 360bbb-3(b)(1), unless the authorization is terminated or revoked.  Performed at [facility], [address] [place], [place] [address]   Blood culture (routine x 2)     Status: None (Preliminary result)   Collection Time: 03/13/21  5:27 AM   Specimen: BLOOD  Result Value Ref Range Status   Specimen Description BLOOD LEFT ARM  Final   Special Requests   Final    BOTTLES DRAWN AEROBIC AND ANAEROBIC Blood Culture adequate volume    Culture   Final    NO GROWTH 2 DAYS Performed at [facility], [address], [place], [place] [address]    Report Status PENDING  Incomplete  Blood culture (routine x 2)     Status: None (Preliminary result)   Collection Time: 03/13/21  5:27 AM   Specimen: BLOOD  Result Value Ref Range Status   Specimen Description BLOOD LEFT ARM  Final   Special Requests   Final    BOTTLES DRAWN AEROBIC AND ANAEROBIC Blood Culture adequate volume   Culture   Final    NO GROWTH 2 DAYS Performed at [facility] [address] [place], [place] [address]    Report Status PENDING  Incomplete  Resp Panel by RT-PCR (Flu A&B, Covid) Nasopharyngeal Swab     Status: None   Collection Time: 03/13/21  5:28 AM   Specimen: Nasopharyngeal Swab; Nasopharyngeal(NP) swabs in vial transport medium  Result Value Ref Range Status   SARS Coronavirus 2 by RT PCR NEGATIVE NEGATIVE Final    Comment: (NOTE) SARS-CoV-2 target nucleic acids are NOT DETECTED.  The SARS-CoV-2 RNA is generally detectable in upper respiratory specimens during the acute phase of infection. The lowest concentration of SARS-CoV-2 viral copies this assay can detect is 138 copies/mL. A negative result does not preclude SARS-Cov-2 infection and should not be used as the sole basis for treatment or other patient management decisions. A negative result may occur with  improper specimen collection/handling, submission of specimen other than nasopharyngeal swab, presence of viral mutation(s) within the areas targeted by this assay, and inadequate number of viral copies(<138 copies/mL). A negative result must be combined with clinical observations, patient history, and epidemiological information. The expected result is Negative.  Fact Sheet for Patients:  EntrepreneurPulse.com.au  Fact Sheet for Healthcare Providers:  IncredibleEmployment.be  This test is no t yet approved or cleared  by the [place] FDA and  has been authorized for detection and/or diagnosis of SARS-CoV-2 by FDA under an Emergency Use Authorization (EUA). This EUA will remain  in effect (meaning this test can be used) for the duration of the COVID-19 declaration under Section 564(b)(1) of the Act, 21 U.S.C.section 360bbb-3(b)(1), unless the authorization is terminated  or revoked sooner.  Influenza A by PCR NEGATIVE NEGATIVE Final   Influenza B by PCR NEGATIVE NEGATIVE Final    Comment: (NOTE) The Xpert Xpress SARS-CoV-2/FLU/RSV plus assay is intended as an aid in the diagnosis of influenza from Nasopharyngeal swab specimens and should not be used as a sole basis for treatment. Nasal washings and aspirates are unacceptable for Xpert Xpress SARS-CoV-2/FLU/RSV testing.  Fact Sheet for Patients: EntrepreneurPulse.com.au  Fact Sheet for Healthcare Providers: IncredibleEmployment.be  This test is not yet approved or cleared by the [place] FDA and has been authorized for detection and/or diagnosis of SARS-CoV-2 by FDA under an Emergency Use Authorization (EUA). This EUA will remain in effect (meaning this test can be used) for the duration of the COVID-19 declaration under Section 564(b)(1) of the Act, 21 U.S.C. section 360bbb-3(b)(1), unless the authorization is terminated or revoked.  Performed at [facility], [address] [place], [place] [address]   MRSA Next Gen by PCR, Nasal     Status: None   Collection Time: 03/13/21  6:58 PM   Specimen: Nasal Mucosa; Nasal Swab  Result Value Ref Range Status   MRSA by PCR Next Gen NOT DETECTED NOT DETECTED Final    Comment: (NOTE) The GeneXpert MRSA Assay (FDA approved for NASAL specimens only), is one component of a comprehensive MRSA colonization surveillance program. It is not intended to diagnose MRSA infection nor to guide or monitor treatment for MRSA infections. Test  performance is not FDA approved in patients less than 50 years old. Performed at [facility], [address], [place], [place] [address]      Time coordinating discharge: Over 30 minutes  SIGNED:   Sidney Ace, MD  [facility] 03/15/2021, 1:58 PM Pager   If 7PM-7AM, please contact night-coverage

## 2021-03-15 NOTE — Progress Notes (Signed)
Daily Progress Note   Patient Name: Samuel Mcknight       Date: 03/15/2021 DOB: November 26, 1940  Age: 80 y.o. MRN#: [id number] Attending Physician: Sidney Ace, MD Primary Care Physician: Kirk Ruths, MD Admit Date: 03/13/2021  Reason for Consultation/Follow-up: Establishing goals of care  Subjective: Patient is resting in bed, significant other at bedside. Plans for home with hospice today; patient is on an oral diet as per SLP, but states he likes clean clear (unthickened) water. She would like for him to go home with antibiotics to complete course of current therapy. Many questions asked regarding prognosis,and were answered. Discussed his chronic diseases and current swallowing issues. Discussed that his food and liquid consistency choices could greatly impact his prognosis. Various scenarios discussed. Advised hospice would work with them over time and answer additional questions, and travel the path with them over time.   I completed a MOST form today with patient and significant other, and the signed original was placed in the chart. A photocopy was placed in the chart to be scanned into EMR. The patient outlined their wishes for the following treatment decisions:  Cardiopulmonary Resuscitation: Do Not Attempt Resuscitation (DNR/No CPR)  Medical Interventions: Comfort Measures: Keep clean, warm, and dry. Use medication by any route, positioning, wound care, and other measures to relieve pain and suffering. Use oxygen, suction and manual treatment of airway obstruction as needed for comfort. Do not transfer to the hospital unless comfort needs cannot be met in current location.  Antibiotics: Determine use of limitation of antibiotics when infection occurs  IV Fluids: No IV fluids  (provide other measures to ensure comfort)  Feeding Tube: No feeding tube     Length of Stay: 2  Current Medications: Scheduled Meds:   Chlorhexidine Gluconate Cloth  6 each Topical Daily   digoxin  0.125 mg Oral Daily   furosemide  20 mg Intravenous Daily   mouth rinse  15 mL Mouth Rinse BID   metoprolol tartrate  25 mg Oral BID   pantoprazole (PROTONIX) IV  40 mg Intravenous Q12H    Continuous Infusions:  ampicillin-sulbactam (UNASYN) IV 3 g (03/15/21 0522)    PRN Meds: ALPRAZolam, levalbuterol, LORazepam, metoprolol tartrate  Physical Exam Pulmonary:     Effort: Pulmonary effort is normal.  Neurological:     Mental Status:  He is alert.            Vital Signs: BP (!) 80/53   Pulse 86   Temp 97.8 F (36.6 C) (Oral)   Resp (!) 29   Ht _0  (1.702 m)   Wt 55.5 kg   SpO2 98%   BMI 19.16 kg/m  SpO2: SpO2: 98 % O2 Device: O2 Device: Nasal Cannula O2 Flow Rate: O2 Flow Rate (L/min): 4 L/min  Intake/output summary:  Intake/Output Summary (Last 24 hours) at 03/15/2021 1109 Last data filed at 03/15/2021 1000 Gross per 24 hour  Intake 590 ml  Output 875 ml  Net -285 ml   LBM: Last BM Date: 03/14/21 Baseline Weight: Weight: 63.5 kg Most recent weight: Weight: 55.5 kg        Patient Active Problem List   Diagnosis Date Noted   Protein-calorie malnutrition, severe 03/14/2021   Acute CHF (congestive heart failure) (HCC) 03/13/2021   Chronic diastolic CHF (congestive heart failure) (HCC) 03/13/2021   Aortic stenosis 03/13/2021   Acute on chronic respiratory failure ([place]) 03/13/2021   Dysphagia, oropharyngeal phase 03/13/2021   Pressure injury of skin 03/13/2021   Hypoxia 03/07/2021   Shortness of breath 03/06/2021   MGUS (monoclonal gammopathy of unknown significance) 03/06/2021   Atrial fibrillation, chronic ([place]) 03/06/2021   Essential hypertension 03/06/2021   Chronic respiratory failure ([place]) 03/06/2021   Lobar pneumonia ([place]) 02/28/2021   Normocytic  anemia 03/15/2019   Anemia of chronic kidney failure, stage 3 (moderate) 03/15/2019    Palliative Care Assessment & Plan   Recommendations/Plan: Plans for home with [facility] hospice.   Code Status:    Code Status Orders  (From admission, onward)           Start     Ordered   03/14/21 1622  Do not attempt resuscitation (DNR)  Continuous       Question Answer Comment  In the event of cardiac or respiratory ARREST Do not call a "code blue"   In the event of cardiac or respiratory ARREST Do not perform Intubation, CPR, defibrillation or ACLS   In the event of cardiac or respiratory ARREST Use medication by any route, position, wound care, and other measures to relive pain and suffering. May use oxygen, suction and manual treatment of airway obstruction as needed for comfort.   Comments MOST form on chart.      03/14/21 1622           Code Status History     Date Active Date Inactive Code Status Order ID Comments User Context   03/13/2021 0851 03/14/2021 1622 Full Code [id number]  Collier Bullock, MD Inpatient   03/06/2021 2013 03/08/2021 1733 Full Code [id number]  Criss Alvine, DO ED   02/28/2021 0606 03/05/2021 1720 Full Code [id number]  Mansy, Arvella Merles, MD ED       Prognosis:  < 6 months  Aspiration PNA. Severe swallow deficits, Severe AS.     Care plan was discussed with SLP and TOC  Thank you for allowing the Palliative Medicine Team to assist in the care of this patient.   Time In: 9:20 Time Out: 10:30 Total Time 70 min Prolonged Time Billed  no       Greater than 50%  of this time was spent counseling and coordinating care related to the above assessment and plan.  Asencion Gowda, NP  Please contact Palliative Medicine Team phone at [phone] for questions and concerns.

## 2021-03-15 NOTE — Progress Notes (Signed)
ANTICOAGULATION CONSULT NOTE  Pharmacy Consult for Warfarin  Indication: atrial fibrillation  Allergies  Allergen Reactions   Bupropion Swelling   Amiodarone Rash    Broke out in dark blue blotches   Diltiazem Rash    Vital Signs: Temp: 97.8 F (36.6 C) (07/14 0400) Temp Source: Oral (07/14 0400) BP: 101/69 (07/14 0900) Pulse Rate: 112 (07/14 0900)  Labs: Recent Labs    03/13/21 0527 03/14/21 0622 03/14/21 1020 03/15/21 0730  HGB 8.0* 6.5* 6.5* 7.8*  HCT 24.0* 19.7*  --  23.6*  PLT 361 294  --  302  LABPROT 25.5* 29.8*  --  29.7*  INR 2.3* 2.8*  --  2.8*  CREATININE 1.60* 1.60*  --  1.50*  TROPONINIHS 21*  --   --   --     Estimated Creatinine Clearance: 30.8 mL/min (A) (by C-G formula based on SCr of 1.5 mg/dL (H)).   Medical History: Past Medical History:  Diagnosis Date   Atrial fibrillation (HCC)    GAD (generalized anxiety disorder)    Hypertension    Prediabetes     Medications/Assessment: Pharmacy consulted to dose warfarin for Afib in this 80 year old male presenting with SOB.     Pt was on Warfarin 2 mg PO daily, last dose 7/11 AND warfarin 2.5 mg PO QOD, last dose 7/10 (pt takes 2 mg along with 2.5 mg every other day to make dose of 4.5 mg).   Date INR Warfarin dose 7/5   2.7  4.5 mg 7/6   2.3 2 mg 7/7 2.7 4.5 mg  --- 7/11 Unk 2 mg PTA  7/12 2.3 4.5 mg (not given) 7/13 2.8 NPO 7/14 2.8 Hold  Goal of Therapy:  INR 2-3  Plan:  --No warfarin tonight. Patient wants to continue to take PO despite risks.  --INR therapeutic today and stable despite held doses x 2. May be explained by antibiotics, hepatic congestion, poor oral intake --Hgb improved to 7.8 with transfusion --Continue to monitor for s/sx of bleeding / reversal of warfarin if indicated --Continue to follow anticoagulation plan and decision to continue based on goals of care  Benita Gutter 03/15/2021 9:12 AM

## 2021-03-15 NOTE — Progress Notes (Addendum)
[place] [facility] 16  [facility]) RN Hospital Liaison Note:  Received request from Adelene Amas, [place] for hospice services at home after discharge. Chart and patient information reviewed by [facility] physician. Hospice eligibility confirmed.  Spoke with patient and his wife [name] at bedside to initiate education related to hospice philosophy, services and team approach to care. Patient/family verbalized understanding of information given. Per discussion, the plan is for discharge home by private vehicle on 7.14.22  DME needs discussed. Patient has the following equipment in the home: w/c, rolling walker and oxygen. No DME needs required prior to discharge from hospital.   Please send signed and completed DNR home with patient/family. Please provide prescriptions at discharge as needed to ensure ongoing symptom management.   [facility] information and contact numbers given to patient and wife [name]. Above information shared with Adelene Amas, LCSWA, TOC Manager.   Please call with any hospice related questions.   Thank you,   Bobbie "Loren Racer, [place], BSN [facility] Liaison [phone]

## 2021-03-15 NOTE — Progress Notes (Signed)
Speech Language Pathology Treatment: Dysphagia  Patient Details Name: Samuel Mcknight MRN: [id number] DOB: [birth year] Today's Date: 03/15/2021 Time: 1430-1530 SLP Time Calculation (min) (ACUTE ONLY): 60 min  Assessment / Plan / Recommendation Clinical Impression  Pt seen for ongoing education re: swallow function post MBSS; and recommendations for Dysphagia diet w/ Honey consistency liquids using aspiration precautions and swallowing strategies. Pt has been consulted by Palliative Care services re: GOC d/t Baseline medical status (nonrheumatic aortic valve stenosis, severe aortic stenosis, shortness of breath, chronic respiratory failure requiring oxygen supplementation at baseline) and newly dx'd Pharyngeal phase Dysphagia seen on the MBSS. After meeting w/ Palliative Care, pt has decided to medically optimize and D/C home with [facility]. Patient wants to eat and drink liquids. He is aware of the risk. He does not want a PEG tube. Much education was given re: results of the MBSS; aspiration precautions; swallowing strategies; model and practice using the swallowing strategies; food consistencies; ordering information re: thickening products/agents; food preparation and options. Also discussed use of an Chiropodist for Pulmonary exercise; model given to both pt/Wife. Also discussed the suggested protocol for Pleasure sips of WATER -- post oral care and not w/ foods. Pt practiced po trials of Honey consistency liquids VIA TSP using swallowing strategies of Chin Tuck and f/u, Dry swallow to reduce/clear pharyngeal residue b/t swallows. Pt was able to use all strategies independently; Wife present recalled all strategies and assisted pt in follow through w/ strategies giving light verbal cues. Pt consumed trials of Honey liquids, purees, and minced food(several bites this Lunch meal) w/ no immediate, overt coughing or discomfort, no decline in status overall throughout all oral intake. A  delayed cough was noted -- suspect d/t pharyngeal residue. Pt stated he continues to feel "good" about the oral intake and that it was "good". He continues to seem energetic and engaged often at the end of the meal/session.  Further discussion w/ pt and Wife answering general questions they had. Handouts given on the information above including education/ordering of products. Products given to take home. Per decisions w/ MD and Palliative Care today, pt is going to proceed w/ an oral, Dysphagia diet. Diet: Dysphagia level 2 (minced w/ gravies), Honey liquids VIA TSP. Chin Tuck w/ ALL swallows; f/u, DRY swallows. Aspiration precautions. Pt is able to feed self. Pt states he is aware of his risk for aspiration.       HPI HPI: Pt is a 80 y.o. 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  He states that his symptoms are usually worse at night and he is unable to lay flat in bed.  He is having to sleep sitting upright.  He denies having any cough but states that sometimes when he drinks water he starts coughing.  He denies having any fever or chills but had a low-grade temp upon arrival to the ER.  He denies having any chest pain, no nausea, no vomiting, no abdominal pain, no dizziness, no lightheadedness, no headache, no blurred vision, no changes in his bowel habits, no lower extremity swelling, no focal deficits.  Chest CT: "right pleural effusion. 2 adjacent nodules in the right upper lobe are again  noted measuring 9 mm and 7 mm. Moderate emphysema. Patchy airspace disease throughout the right  lung, most confluent in the right lower  lobe. Scarring in the lingula and left lower lobe".  Of note, per chart notes and pt/Wife report, pt has had  several dxs of Pneumonia and 4 thoracentesis procedures in recent ~6 months.  MBSS completed this admit revealing Severe Pharyngeal phase Dysphagia w/ frank aspiration of thin and Nectar liquids; aspiration of pharyngeal residue in between trials. Palliative Care following pt for GOC. Pt is d/c'ing home w/ Hospice services.      SLP Plan  All goals met       Recommendations  Diet recommendations: Dysphagia 2 (fine chop);Dysphagia 1 (puree);Honey-thick liquid Liquids provided via: Teaspoon Medication Administration: Whole meds with puree (for safer swallowing) Supervision: Patient able to self feed Compensations: Minimize environmental distractions;Slow rate;Small sips/bites;Lingual sweep for clearance of pocketing;Multiple dry swallows after each bite/sip;Clear throat intermittently;Chin tuck;Hard cough after swallow (at end of meals) Postural Changes and/or Swallow Maneuvers: Out of bed for meals;Seated upright 90 degrees;Upright 30-60 min after meal                General recommendations:  (Hospice services following) Oral Care Recommendations: Oral care QID;Patient independent with oral care;Oral care before and after PO;Oral care prior to ice chip/H20 Follow up Recommendations:  (TBD) SLP Visit Diagnosis: Dysphagia, pharyngeal phase (R13.13) (Aspiration) Plan: All goals met       GO                 [name], MS, CCC-SLP Speech Language Pathologist Rehab Services [phone] [facility] 03/15/2021, 3:16 PM

## 2021-03-18 LAB — CULTURE, BLOOD (ROUTINE X 2)
Culture: NO GROWTH
Culture: NO GROWTH
Special Requests: ADEQUATE
Special Requests: ADEQUATE

## 2021-03-28 ENCOUNTER — Encounter: Payer: Self-pay | Admitting: Internal Medicine

## 2021-04-02 ENCOUNTER — Ambulatory Visit: Payer: BLUE CROSS/BLUE SHIELD | Admitting: Family

## 2021-04-02 DEATH — deceased

## 2021-04-16 LAB — ACID FAST CULTURE WITH REFLEXED SENSITIVITIES (MYCOBACTERIA): Acid Fast Culture: NEGATIVE

## 2021-04-26 ENCOUNTER — Encounter: Payer: Self-pay | Admitting: Internal Medicine

## 2021-05-03 ENCOUNTER — Ambulatory Visit: Payer: Self-pay | Attending: Specialist

## 2022-03-31 IMAGING — US US THORACENTESIS ASP PLEURAL SPACE W/IMG GUIDE
1 series · 2 of 2 positions shown · non-contrast
Comparison: none

INDICATION: Patient with a history of heart failure, atrial fibrillation and
COPD presents today with new onset right-sided pleural effusion.
Interventional radiology asked to perform a therapeutic and
diagnostic thoracentesis.

[Series 1: us thoracentesis asp pleural s · 2 of 2 slices shown]
[im 1/2]
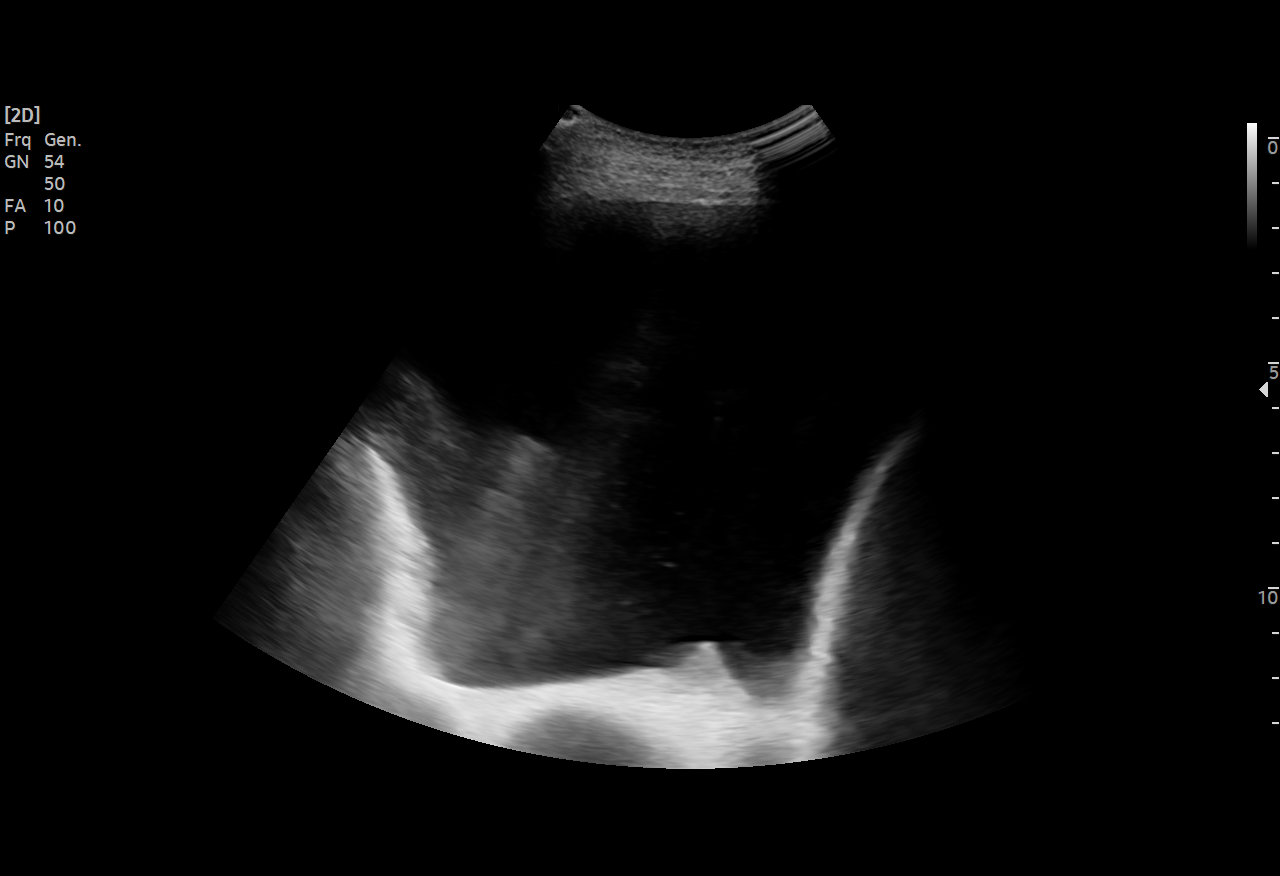
[im 2/2]
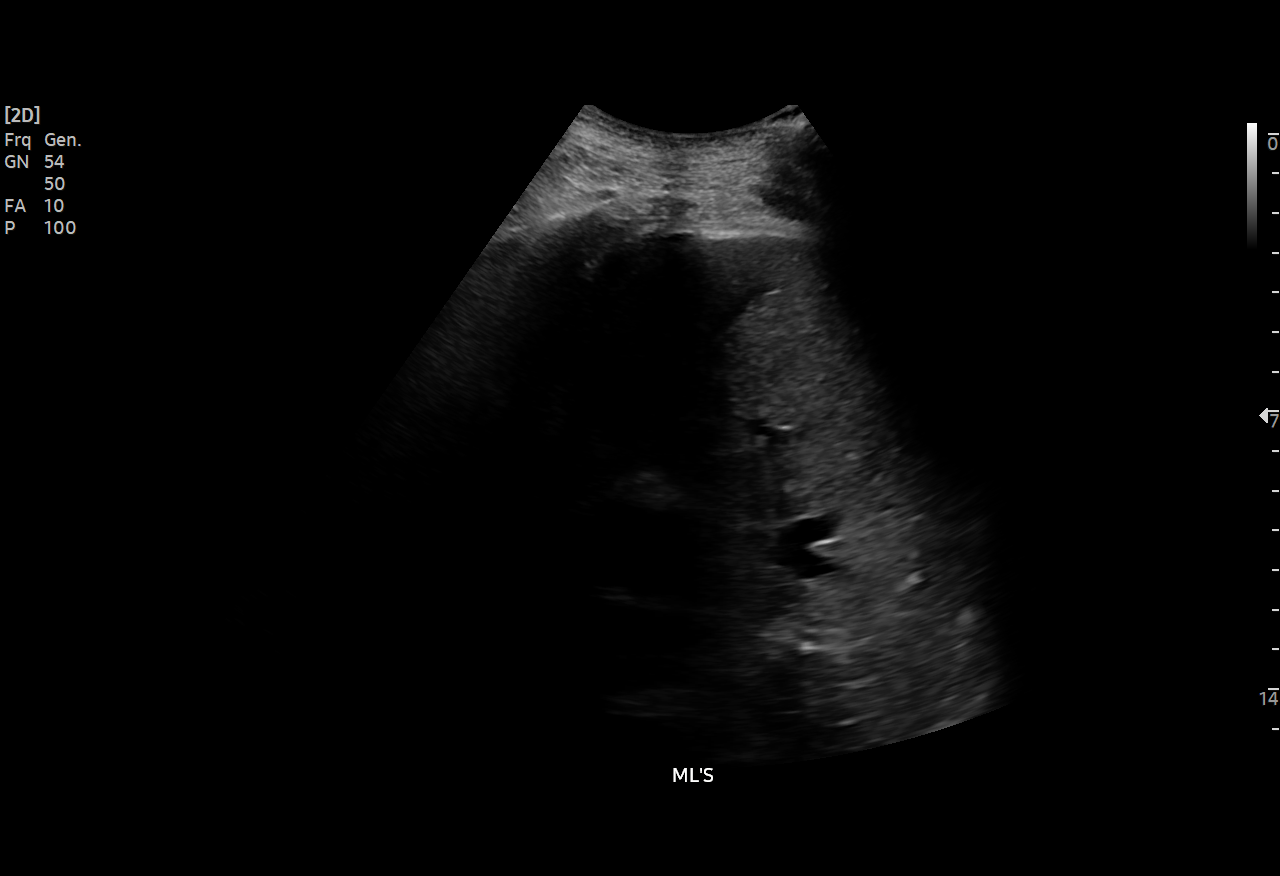

[2 of 2 positions shown; findings below may reference images not displayed]

EXAM:
ULTRASOUND GUIDED THORACENTESIS

MEDICATIONS:
1% lidocaine 10 mL

COMPLICATIONS:
None immediate.

PROCEDURE:
An ultrasound guided thoracentesis was thoroughly discussed with the
patient and questions answered. The benefits, risks, alternatives
and complications were also discussed. The patient understands and
wishes to proceed with the procedure. Written consent was obtained.

Ultrasound was performed to localize and mark an adequate pocket of
fluid in the right chest. The area was then prepped and draped in
the normal sterile fashion. 1% Lidocaine was used for local
anesthesia. Under ultrasound guidance a 6 Fr Safe-T-Centesis
catheter was introduced. Thoracentesis was performed. The catheter
was removed and a dressing applied.
FINDINGS: A total of approximately 1.8 L of amber-colored fluid was removed.
Samples were sent to the laboratory as requested by the clinical
team.
IMPRESSION: Successful ultrasound guided right thoracentesis yielding 1.8 L of
pleural fluid. Read by: Rogelyn Fagnan, NP

## 2022-04-13 IMAGING — DX DG CHEST 1V PORT
1 series · 1 of 1 positions shown · non-contrast
Comparison: September 26, 2020

CLINICAL DATA: Status post thoracentesis

EXAM:
PORTABLE CHEST 1 VIEW

[chest ap]
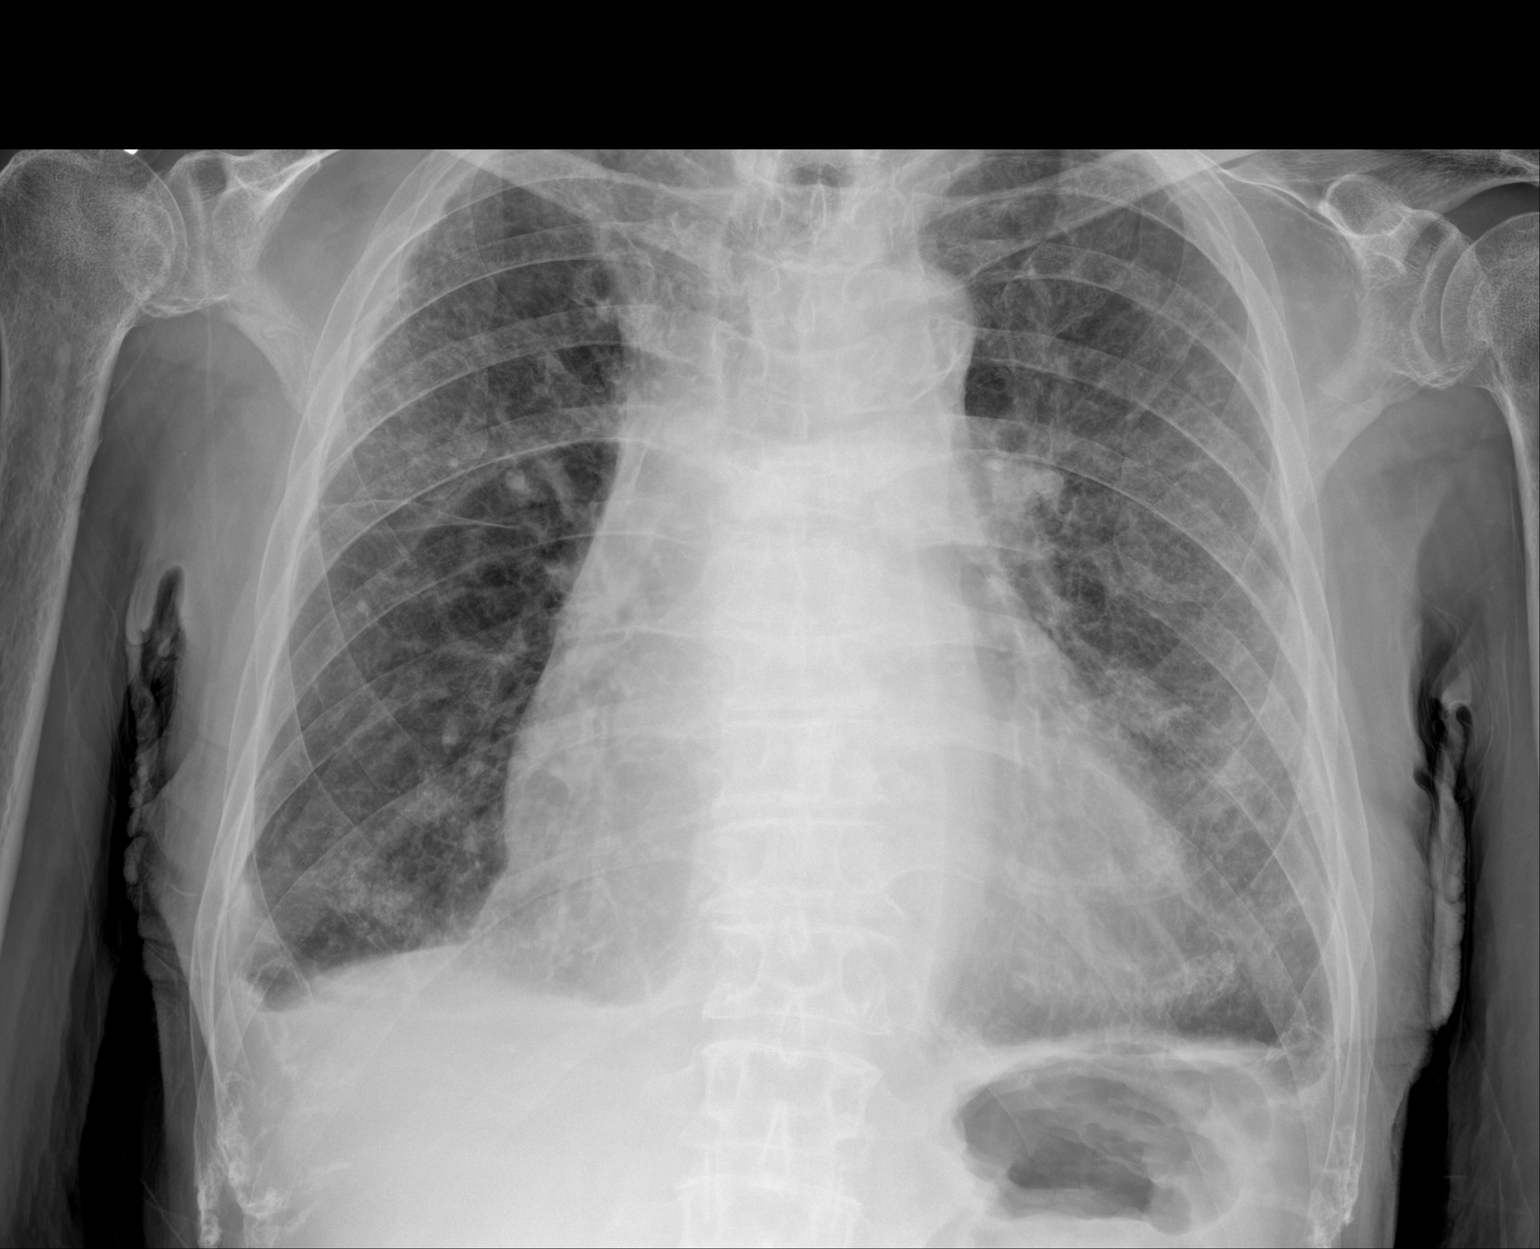

[1 of 1 positions shown; findings below may reference images not displayed]

FINDINGS: No pneumothorax evident. Small right pleural effusion. Areas of
persistent patchy airspace opacity bilaterally. There is
cardiomegaly, stable, with pulmonary vascularity normal. No
adenopathy. There is aortic atherosclerosis. No bone lesions
IMPRESSION: No pneumothorax. Small right pleural effusion. Areas of ill-defined
patchy opacity bilaterally, potentially representing scattered foci
of pneumonia. Appearance similar to recent prior study. No
consolidation. Stable cardiac prominence. Aortic Atherosclerosis
([id number]).

## 2022-05-01 IMAGING — US US THORACENTESIS ASP PLEURAL SPACE W/IMG GUIDE
1 series · 2 of 2 positions shown · non-contrast
Comparison: none

INDICATION: Patient with history of heart failure, COPD. Recurrent right-sided
pleural effusion. Request for diagnostic and therapeutic
thoracentesis.

[Series 1: us thoracentesis asp pleural s · 2 of 2 slices shown]
[im 1/2]
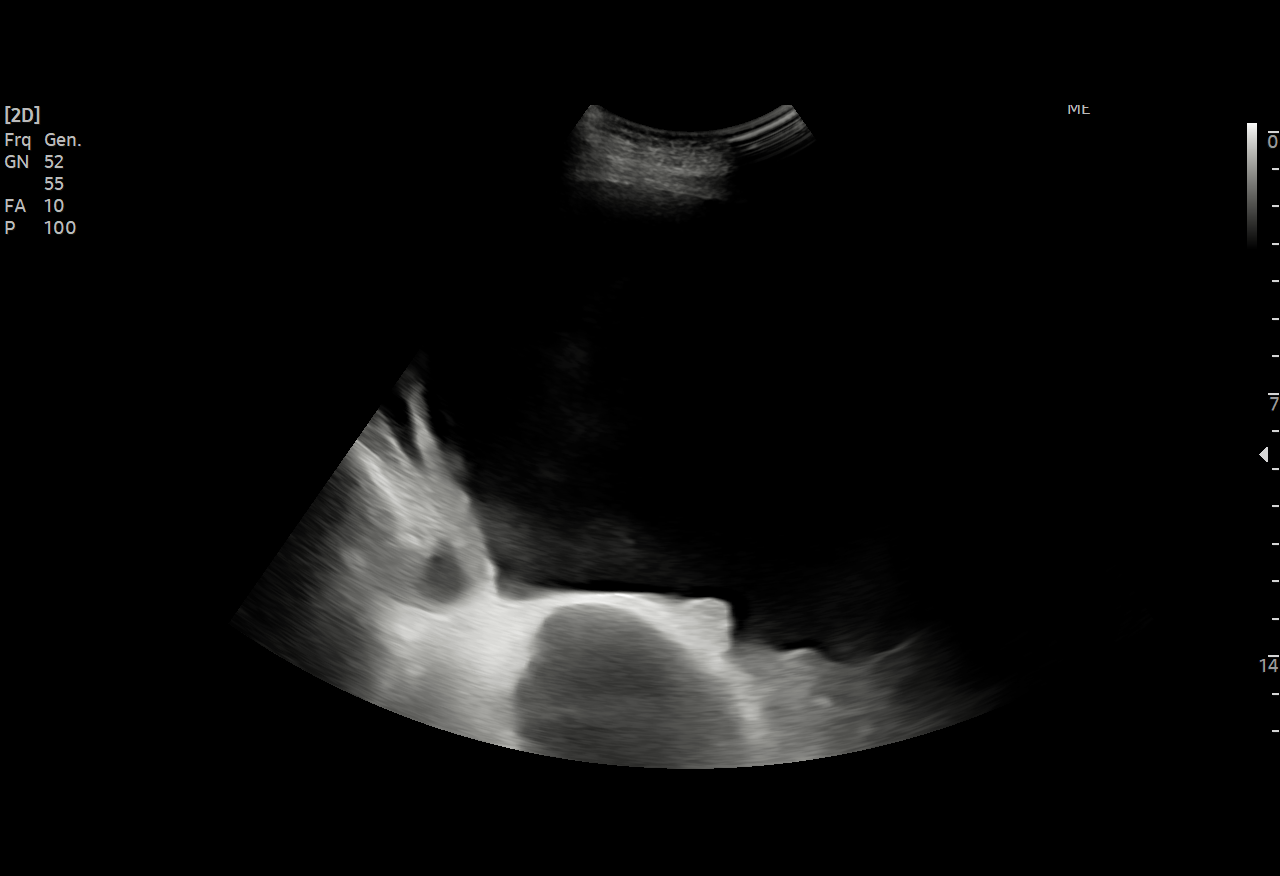
[im 2/2]
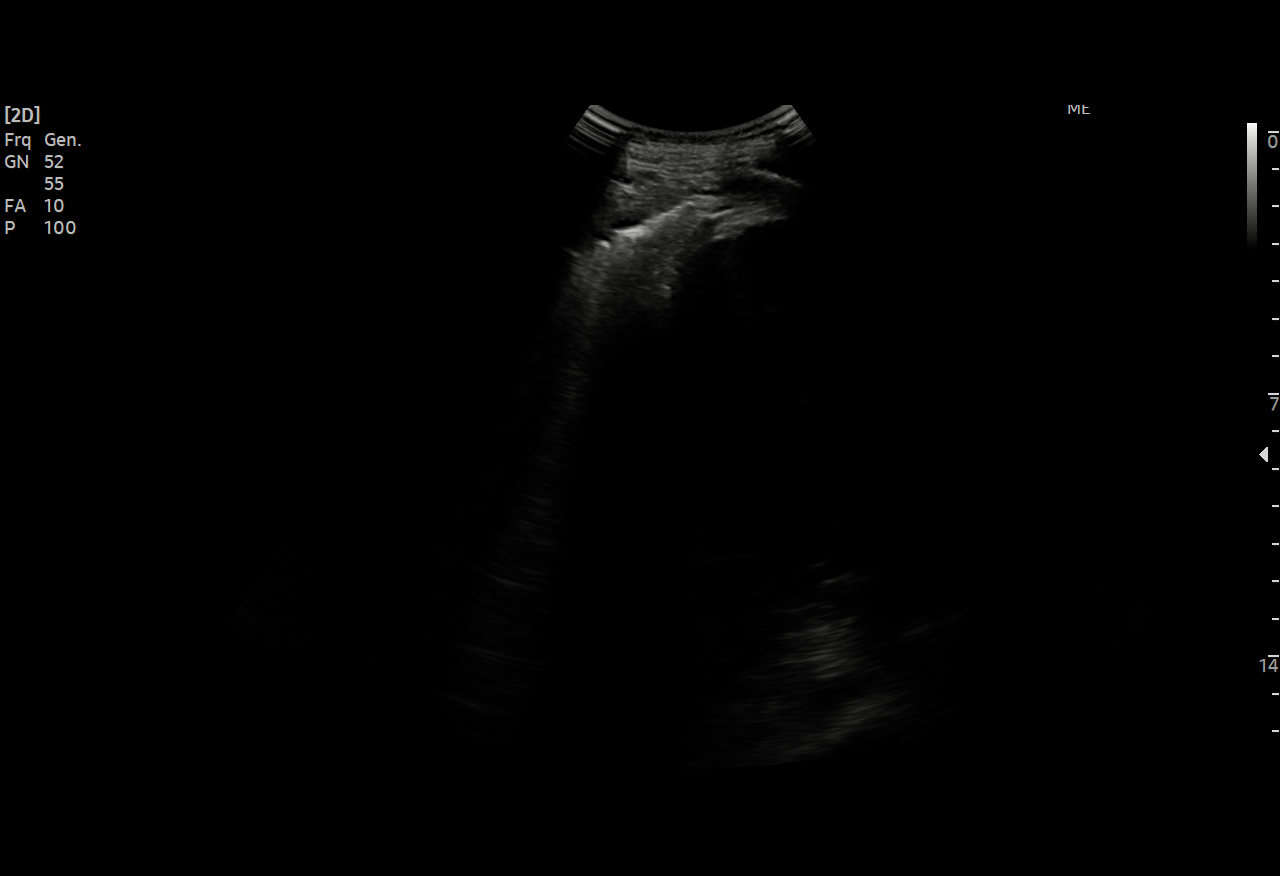

[2 of 2 positions shown; findings below may reference images not displayed]

EXAM:
ULTRASOUND GUIDED RIGHT THORACENTESIS

MEDICATIONS:
1% plain lidocaine, 10 mL

COMPLICATIONS:
None immediate.

PROCEDURE:
An ultrasound guided thoracentesis was thoroughly discussed with the
patient and questions answered. The benefits, risks, alternatives
and complications were also discussed. The patient understands and
wishes to proceed with the procedure. Written consent was obtained.

Ultrasound was performed to localize and mark an adequate pocket of
fluid in the right chest. The area was then prepped and draped in
the normal sterile fashion. 1% Lidocaine was used for local
anesthesia. Under ultrasound guidance a 6 Fr Safe-T-Centesis
catheter was introduced. Thoracentesis was performed. The catheter
was removed and a dressing applied.
FINDINGS: A total of approximately 1.75 L of clear, amber colored fluid was
removed. Samples were sent to the laboratory as requested by the
clinical team.
IMPRESSION: Successful ultrasound guided right thoracentesis yielding 1.75 L of
pleural fluid.

## 2022-09-02 IMAGING — DX DG CHEST 1V PORT
1 series · 1 of 1 positions shown · non-contrast
Comparison: 10/27/2020

CLINICAL DATA: Shortness of breath

EXAM:
PORTABLE CHEST 1 VIEW

[chest ap]
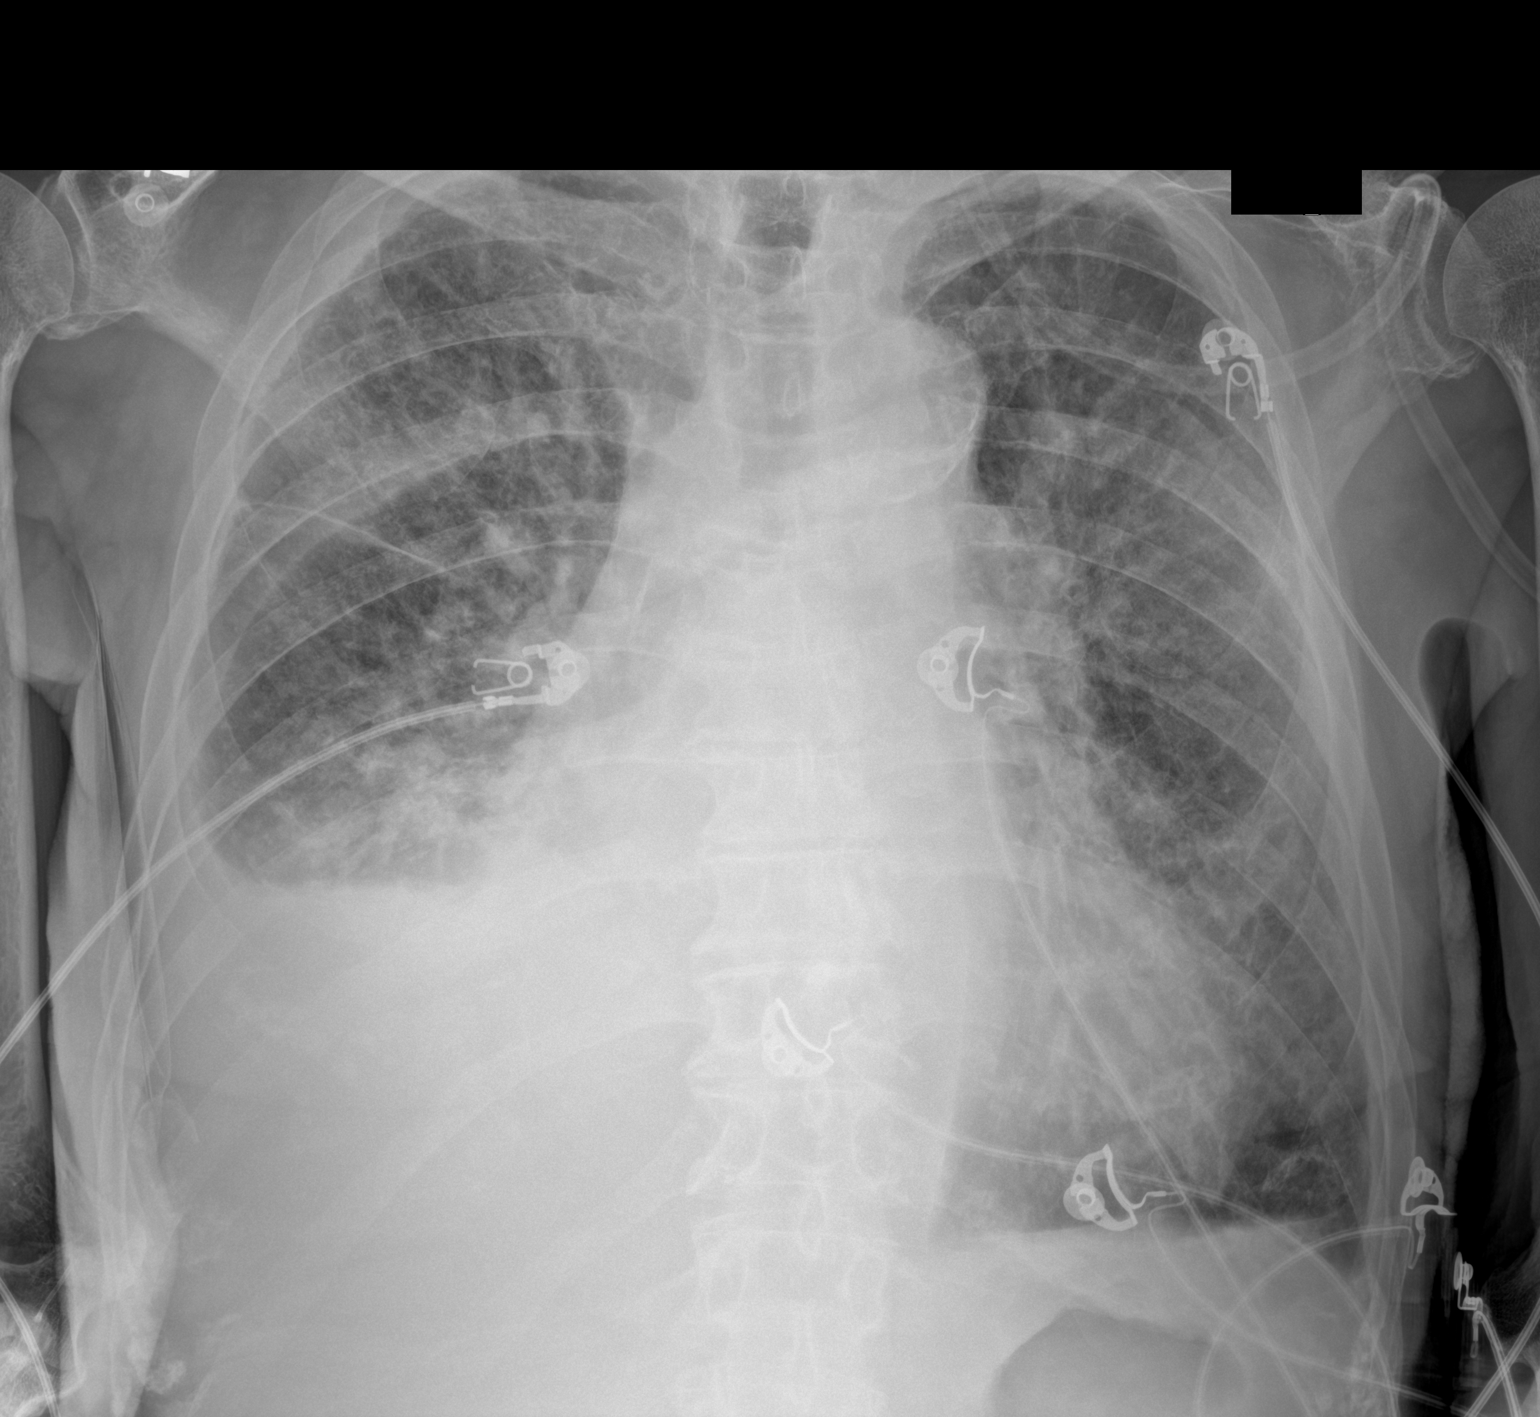

[1 of 1 positions shown; findings below may reference images not displayed]

FINDINGS: Small right pleural effusion with basilar atelectasis. Mild
cardiomegaly. Right-greater-than-left mild interstitial opacity.
IMPRESSION: Cardiomegaly, small right pleural effusion and mild pulmonary edema.
